# Patient Record
Sex: Female | Born: 1965 | Hispanic: Yes | Marital: Married | Smoking: Former smoker
Health system: Southern US, Community
[De-identification: ages and names within clinical notes are randomized; demographics above are authoritative.]

## PROBLEM LIST (undated history)

## (undated) ENCOUNTER — Ambulatory Visit: Admission: EM | Source: Home / Self Care

## (undated) DIAGNOSIS — E7800 Pure hypercholesterolemia, unspecified: Secondary | ICD-10-CM

## (undated) DIAGNOSIS — J45909 Unspecified asthma, uncomplicated: Secondary | ICD-10-CM

## (undated) DIAGNOSIS — C55 Malignant neoplasm of uterus, part unspecified: Secondary | ICD-10-CM

## (undated) DIAGNOSIS — N200 Calculus of kidney: Secondary | ICD-10-CM

## (undated) DIAGNOSIS — R002 Palpitations: Secondary | ICD-10-CM

## (undated) DIAGNOSIS — I442 Atrioventricular block, complete: Secondary | ICD-10-CM

## (undated) HISTORY — DX: Palpitations: R00.2

## (undated) HISTORY — PX: PATENT FORAMEN OVALE CLOSURE: SHX2181

## (undated) HISTORY — DX: Malignant neoplasm of uterus, part unspecified: C55

## (undated) HISTORY — PX: CHOLECYSTECTOMY: SHX55

## (undated) HISTORY — DX: Calculus of kidney: N20.0

## (undated) HISTORY — PX: APPENDECTOMY: SHX54

## (undated) HISTORY — PX: OTHER SURGICAL HISTORY: SHX169

## (undated) HISTORY — PX: WRIST SURGERY: SHX841

---

## 1996-10-28 DIAGNOSIS — C55 Malignant neoplasm of uterus, part unspecified: Secondary | ICD-10-CM

## 1996-10-28 HISTORY — DX: Malignant neoplasm of uterus, part unspecified: C55

## 2010-05-29 ENCOUNTER — Emergency Department: Payer: Self-pay | Admitting: Emergency Medicine

## 2010-08-20 ENCOUNTER — Emergency Department: Payer: Self-pay | Admitting: Emergency Medicine

## 2010-09-04 ENCOUNTER — Encounter: Payer: Self-pay | Admitting: Cardiovascular Disease

## 2010-09-04 ENCOUNTER — Emergency Department: Payer: Self-pay | Admitting: Emergency Medicine

## 2010-10-12 ENCOUNTER — Emergency Department: Payer: Self-pay | Admitting: Emergency Medicine

## 2010-11-02 ENCOUNTER — Ambulatory Visit
Admission: RE | Admit: 2010-11-02 | Discharge: 2010-11-02 | Payer: Self-pay | Source: Home / Self Care | Attending: Cardiovascular Disease | Admitting: Cardiovascular Disease

## 2010-11-02 ENCOUNTER — Encounter: Payer: Self-pay | Admitting: Cardiovascular Disease

## 2010-11-02 DIAGNOSIS — E782 Mixed hyperlipidemia: Secondary | ICD-10-CM | POA: Insufficient documentation

## 2010-11-02 DIAGNOSIS — E7849 Other hyperlipidemia: Secondary | ICD-10-CM | POA: Insufficient documentation

## 2010-11-02 DIAGNOSIS — R000 Tachycardia, unspecified: Secondary | ICD-10-CM | POA: Insufficient documentation

## 2010-11-02 DIAGNOSIS — E785 Hyperlipidemia, unspecified: Secondary | ICD-10-CM | POA: Insufficient documentation

## 2010-11-02 DIAGNOSIS — R0602 Shortness of breath: Secondary | ICD-10-CM | POA: Insufficient documentation

## 2010-11-20 ENCOUNTER — Ambulatory Visit: Admission: RE | Admit: 2010-11-20 | Discharge: 2010-11-20 | Payer: Self-pay | Source: Home / Self Care

## 2010-11-20 ENCOUNTER — Other Ambulatory Visit: Payer: Self-pay | Admitting: Cardiovascular Disease

## 2010-11-21 ENCOUNTER — Ambulatory Visit
Admission: RE | Admit: 2010-11-21 | Discharge: 2010-11-21 | Payer: Self-pay | Source: Home / Self Care | Attending: Internal Medicine | Admitting: Internal Medicine

## 2010-11-21 ENCOUNTER — Encounter: Payer: Self-pay | Admitting: Cardiovascular Disease

## 2010-11-26 LAB — CONVERTED CEMR LAB
ALT: 10 units/L (ref 0–35)
Alkaline Phosphatase: 63 units/L (ref 39–117)
HDL: 48 mg/dL (ref >39)
LDL Cholesterol: 199 mg/dL — ABNORMAL HIGH (ref 0–99)
Total Bilirubin: 0.6 mg/dL (ref 0.3–1.2)
Total CHOL/HDL Ratio: 5.6
Total Protein: 6.8 g/dL (ref 6.0–8.3)

## 2010-11-29 NOTE — Assessment & Plan Note (Signed)
Summary: NP6/AMD   Visit Type:  Initial Consult Primary Provider:  None  CC:  c/o palpitations and SOB. f/u [facility].  History of Present Illness: Lori [name] is a pleasant 45 year old woman who is predominantly Spanish-speaking who presents with her husband for evaluation of tachycardia and shortness of breath with chest pain.  Her husband is translating for her today. She reports that she has had 3 significant episodes of tachypalpitations associated with shortness of breath and chest discomfort. She reports that the episodes present rapidly. Typically they last for several minutes at a time, the most recent episode was 10 minutes which was at the end of November. She has not had an episode in one month. She describes the symptoms as a rapid pounding that eventually seems to ease off. The rate is very fast and causes some shortness of breath. Typically it comes on at rest or light exertion. She denies any new medications. She reports that one family member also has similar symptoms.  She reports being told in the past that she has a valve problem. She did present to the emergency room on December 16 for the symptoms though no physician note is available. Her lab work is normal including cardiac enzymes, basic metabolic panel, LFTs, BNP, CBC, sedimentation rate, d-dimer  EKG today shows normal sinus rhythm with rate 64 beats per minute, no significant ST or T wave changes  Preventive Screening-Counseling & Management  Alcohol-Tobacco     Smoking Status: never  Caffeine-Diet-Exercise     Does Patient Exercise: no      Drug Use:  no.    Current Medications (verified): 1)  Aspir-Low 81 Mg Tbec (Aspirin) .Marland Kitchen.. 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  Alcohol Use - yes-occasionally Drug Use - no Regular Exercise - no Smoking Status:  never Drug Use:  no Does Patient Exercise:  no  Review of Systems  The patient denies fever, weight loss, weight gain, vision loss, decreased hearing, hoarseness, syncope, dyspnea on exertion, peripheral edema, prolonged cough, abdominal pain, incontinence, muscle weakness, depression, and enlarged lymph nodes.         tachycardia with rapid palpitations, SOB, CP  Vital Signs:  Patient profile:   45 year old female Height:      64 inches Weight:      157.50 pounds BMI:     27.13 Pulse rate:   64 / minute BP sitting:   112 / 68  (left arm) Cuff size:   regular  Vitals Entered By: Lysbeth Galas CMA (November 02, 2010 2:03 PM)   Physical Exam  General:  Well developed, well nourished, in no acute distress. Head:  normocephalic and atraumatic Neck:  Neck supple, no JVD. No masses, thyromegaly or abnormal cervical nodes. Lungs:  Clear bilaterally to auscultation and percussion. Heart:  Non-displaced PMI, chest non-tender; regular rate and rhythm, S1, S2 without murmurs, rubs or gallops. Carotid upstroke normal, no bruit.  Pedals normal pulses. No edema, no varicosities. Abdomen:  Bowel sounds positive; abdomen soft and non-tender without masses Msk:  Back normal, normal gait. Muscle strength and tone normal. Pulses:  pulses normal in all 4 extremities Extremities:  No clubbing or cyanosis. Neurologic:  Alert  and oriented x 3. Skin:  Intact without lesions or rashes. Psych:  Normal affect.   Impression & Recommendations:  Problem # 1:  UNSPECIFIED TACHYCARDIA (ICD-785.0) symptoms are concerning for an SVT or atrial tachycardia. She has not tried any medications in the past. Her symptoms present very rarely and she's only had 3 episodes total, one of which was back [place] They do not last as long time and is uncertain if she would benefit from medication p.r.n. for rhythm control. As they are so rare, we will hold off on Holter or event monitor testing.  We have ordered an echocardiogram for symptoms of tachycardia, chest pain, shortness of breath and as she reports a history of valve problem in the past. We have given her a prescription for diltiazem 30 mg to be taken p.r.n. if her symptoms of tachycardia persist.  We have taught her the carotid sinus maneuver and Valsalva and have asked her to contact us if she starts having more episodes.  Orders: Echocardiogram (Echo)  Problem # 2:  HYPERLIPIDEMIA (ICD-272.4) We have suggested that she come to the office next week for a cholesterol check when she has been fasting.  Orders: T-Hepatic Function [phone]) T-Lipid Profile [phone])  Patient Instructions: 1)  Your physician recommends that you schedule a follow-up appointment in: 6 months 2)  Your physician recommends that you return for a FASTING lipid profile: To be done at [facility] (lipid/lft) 3)  Your physician has recommended you make the following change in your medication: START Diltiazem 30mg  1-2 tablets by mouth as needed for fast heartbeat. 4)  Your physician has requested that you have an echocardiogram.  Echocardiography is a painless test that uses sound waves to create images of your heart. It provides your doctor with information about the size and shape of your heart and how well your heart's chambers and valves are working.  This procedure takes approximately one hour. There are no restrictions for this procedure. Prescriptions: DILTIAZEM HCL 30 MG TABS (DILTIAZEM HCL) Take 1-2 tablets as needed for tachycardia  #60 x 6   Entered by:   Lanny Hurst RN   Authorized by:   Dossie Arbour MD   Signed by:   Lanny Hurst RN on 11/02/2010   Method used:   Faxed to ...       [facility] (retail)       [address]        [place], [place]    [place]       Ph: [phone]       Fax: [phone]   RxID:   [id number] DILTIAZEM HCL 30 MG TABS (DILTIAZEM HCL) Take 1 tablet by mouth once a day  #30 x 6   Entered by:   Lanny Hurst RN   Authorized by:   Dossie Arbour MD   Signed by:   Lanny Hurst RN on 11/02/2010   Method used:   Faxed to ...       [facility] (retail)       [address]       [place], [place]    [place]       Ph: [phone]       Fax: [phone]   RxID:   [id number]

## 2011-01-12 ENCOUNTER — Ambulatory Visit: Payer: Federal, State, Local not specified - PPO | Admitting: Family Medicine

## 2011-01-12 ENCOUNTER — Encounter: Payer: Self-pay | Admitting: Family Medicine

## 2011-01-12 DIAGNOSIS — F40298 Other specified phobia: Secondary | ICD-10-CM

## 2011-01-12 DIAGNOSIS — E785 Hyperlipidemia, unspecified: Secondary | ICD-10-CM

## 2011-01-12 DIAGNOSIS — I499 Cardiac arrhythmia, unspecified: Secondary | ICD-10-CM

## 2011-01-12 DIAGNOSIS — F449 Dissociative and conversion disorder, unspecified: Secondary | ICD-10-CM

## 2011-01-15 NOTE — Assessment & Plan Note (Signed)
Summary: anxiety/jbb   Vital Signs:  Patient Profile:   45 Years Old Female CC:      anxiety Height:     66 inches Weight:      158 pounds Temp:     98 degrees F oral Pulse rate:   76 / minute Pulse rhythm:   regular Resp:     18 per minute BP sitting:   114 / 70  (left arm) Cuff size:   regular  Vitals Entered By: Providence Crosby LPN (February 10, 2011 2:13 PM)                  Current Allergies (reviewed today): No known allergies History of Present Illness History from: patient and husband Chief Complaint: anxiety History of Present Illness: SPANISH TRANSLATOR USED TO SPEAK WITH PATIENT (HUSBAND) WHO VERBALIZED UNDERSTANDING.  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  Pt has not been able to sleep.  She is hysterical!  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  She also has a history of palpatations.   She is having an exacerbation of her anxiety and irrational fears of finding spiders in her home.  She is caring for a 57 month old (breastfeeding).     REVIEW OF SYSTEMS Constitutional Symptoms      Denies fever, chills, night sweats, weight loss, weight gain, and fatigue.  Eyes       Denies change in vision, eye pain, eye discharge, glasses, contact lenses, and eye surgery. Ear/Nose/Throat/Mouth       Denies hearing loss/aids, change in hearing, ear pain, ear discharge, dizziness, frequent runny nose, frequent nose bleeds, sinus problems, sore throat, hoarseness, and tooth pain or bleeding.  Respiratory       Complains of shortness of breath.      Denies dry cough, productive cough, wheezing, asthma, bronchitis, and emphysema/COPD.  Cardiovascular       Complains of chest pain.      Denies murmurs and tires easily with exhertion.    Gastrointestinal       Denies stomach pain, nausea/vomiting, diarrhea, constipation, blood in bowel movements,  and indigestion. Genitourniary       Denies painful urination, kidney stones, and loss of urinary control. Neurological       Denies paralysis, seizures, and fainting/blackouts. Musculoskeletal       Denies muscle pain, joint pain, joint stiffness, decreased range of motion, redness, swelling, muscle weakness, and gout.  Skin       Denies bruising, unusual mles/lumps or sores, and hair/skin or nail changes.      Comments: Itching all over body Psych       Complains of mood changes and anxiety/stress.      Denies temper/anger issues, speech problems, depression, and sleep problems. Other Comments: Phobia to spiders   Past History:  Family History: Last updated: 10-Feb-2011 Father: Deceased Heart attack Mother: alive history of hypertension Siblings: brother : unknown Sister : not known  Social History: Last updated: 2011-02-10 Marital Status: Married Children:4 children  Occupation:  Futures trader  Past Medical History: Hyperlipidemia Palpatations Arachnophobia Low Blood Pressure Ventricular Arrythmia  Past Surgical History: Appendectomy Caesarean section Cholecystectomy Hand surgery  Family History: Father: Deceased Heart attack Mother: alive history of hypertension Siblings: brother : unknown Sister : not known  Social History: Marital Status: Married Children:4 children  Occupation:  Futures trader Physical Exam General appearance: well developed,  well nourished, no acute distress Head: normocephalic, atraumatic Eyes: conjunctivae and lids normal Pupils: equal, round, reactive to light Ears: normal, no lesions or deformities Nasal: mucosa pink, nonedematous, no septal deviation, turbinates normal Oral/Pharynx: tongue normal, posterior pharynx without erythema or exudate Neck: neck supple,  trachea midline, no masses Chest/Lungs: no rales, wheezes, or rhonchi bilateral, breath sounds equal without effort Heart: regular rate and  rhythm, no murmur Abdomen: soft,  non-tender without obvious organomegaly Extremities: normal extremities Neurological: grossly intact and non-focal Skin: no obvious rashes or lesions MSE: anxious and crying hysterically over spider Assessment Problems:   New Problems: VENTRICULAR ARRHYTHMIA (ICD-427.9) HYSTERIA, UNSPECIFIED (ICD-300.10) 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 The risks, benefits and possible side effects were clearly explained and discussed with the patient.  The patient verbalized clear understanding.  The patient was given instructions to return if symptoms don't improve, worsen or new changes develop.  If it is not during clinic hours and the patient cannot get back to this clinic then the patient was told to seek medical care at an available urgent care or emergency department.  The patient verbalized understanding.   Demonstrates willingness to comply.  Plan New Medications/Changes: LORAZEPAM 0.5 MG TABS (LORAZEPAM) take 1 by mouth every 4-6 hours as needed severe anxiety and hysteria.  Caution will cause drowsiness: Do not breastfeed on this medication.  #20 x 0, 01/12/2011, Standley Dakins MD  Planning Comments:   Husband was Engineer, structural Used to Communicate with patient.  The patient verbalized clear understanding.   Pt was given instructions to discontinue breastfeeding while taking the medication and to pump and dump to keep milk flowing.     Follow Up: Follow up in 1-2 days if no improvement, Follow up on an as needed basis, Follow up with Primary Physician  The patient and/or caregiver has been counseled thoroughly with regard to medications prescribed including dosage, schedule, interactions, rationale for use, and possible side effects and they verbalize understanding.  Diagnoses and expected course of recovery discussed and will return if not improved as expected or if the condition worsens. Patient and/or  caregiver verbalized understanding.  Prescriptions: LORAZEPAM 0.5 MG TABS (LORAZEPAM) take 1 by mouth every 4-6 hours as needed severe anxiety and hysteria.  Caution will cause drowsiness: Do not breastfeed on this medication.  #20 x 0   Entered and Authorized by:   Standley Dakins MD   Signed by:   Standley Dakins MD on 01/12/2011   Method used:   Handwritten   RxID:   [id number]   Patient Instructions: 1)  Go to the pharmacy to pick up the medication.  2)  Do not Breastfeed while taking this medication (lorazepam).  You can Pump and Dump to keep your milk supply flowing but don't give to the infant.  3)  Return or go to the ER if no improvement or symptoms getting worse.   4)  The patient was informed that there is no on-call provider or services available at this clinic during off-hours (when the clinic is closed).  If the patient developed a problem or concern that required immediate attention, the patient was advised to go the the nearest available urgent care or emergency department for medical care.  The patient verbalized understanding.

## 2011-01-24 NOTE — Letter (Signed)
Summary: history form   history form   Imported By: Eugenio Hoes 01/16/2011 10:18:22  _____________________________________________________________________  External Attachment:    Type:   Image     Comment:   External Document

## 2011-02-12 ENCOUNTER — Telehealth: Payer: Self-pay | Admitting: Cardiovascular Disease

## 2011-02-12 ENCOUNTER — Encounter (INDEPENDENT_AMBULATORY_CARE_PROVIDER_SITE_OTHER): Payer: Federal, State, Local not specified - PPO | Admitting: *Deleted

## 2011-02-12 ENCOUNTER — Encounter: Payer: Self-pay | Admitting: *Deleted

## 2011-02-12 ENCOUNTER — Encounter: Payer: Self-pay | Admitting: Cardiovascular Disease

## 2011-02-12 ENCOUNTER — Ambulatory Visit (INDEPENDENT_AMBULATORY_CARE_PROVIDER_SITE_OTHER): Payer: Federal, State, Local not specified - PPO | Admitting: Cardiovascular Disease

## 2011-02-12 DIAGNOSIS — R000 Tachycardia, unspecified: Secondary | ICD-10-CM

## 2011-02-12 DIAGNOSIS — R002 Palpitations: Secondary | ICD-10-CM

## 2011-02-12 DIAGNOSIS — R0602 Shortness of breath: Secondary | ICD-10-CM

## 2011-02-12 DIAGNOSIS — E785 Hyperlipidemia, unspecified: Secondary | ICD-10-CM

## 2011-02-12 DIAGNOSIS — M79609 Pain in unspecified limb: Secondary | ICD-10-CM

## 2011-02-12 DIAGNOSIS — M79606 Pain in leg, unspecified: Secondary | ICD-10-CM | POA: Insufficient documentation

## 2011-02-12 NOTE — Assessment & Plan Note (Signed)
Her shortness of breath in the setting of her tachycardia is likely a rate related pain. We have encouraged her to continue her diltiazem p.r.n., taking up to 2 tablets at a time also with Valsalva and carotid sinus massage.

## 2011-02-12 NOTE — Telephone Encounter (Signed)
Pt in for office visit to see Dr. Mariah Milling.

## 2011-02-12 NOTE — Assessment & Plan Note (Signed)
I am concerned about atrial tachycardia or SVT. I've ordered a 48 hour Holter monitor. If this is unrevealing, we could order an event monitor for one month.

## 2011-02-12 NOTE — Assessment & Plan Note (Signed)
Etiology of her leg pain is uncertain. She reports there is some swelling and tenderness still after her long car trip. We have ordered a right  lower extremity ultrasound to exclude DVT

## 2011-02-12 NOTE — Progress Notes (Signed)
   Patient ID: Lori [name], female    DOB: October 06, 1966, 45 y.o.   MRN: [id number]  HPI Comments: Ms. Segers is a pleasant 45 year old woman who is predominantly Spanish-speaking who presents for follow up evaluation of tachycardia and shortness of breath with chest pain.   She reports that she continues to have episodes of tachypalpitations associated with shortness of breath and chest pain. She had one last night, several episodes over the past week. He also had several episodes over the past month. She was on a recent trip to [place] and also developed left leg pain in the calf region and she is worried about a blood clot. Her leg is still sore today several days after her recent long car trip. She woke up at 2:00 in the morning last night with tachypalpitations, tried diltiazem, tried Valsalva maneuvers and it did not break. It eventually eased off on its own.   She has had trips to the emergency room such as last December for tachypalpitations.   EKG today shows normal sinus rhythm with rate 66  beats per minute, Sinus arrhythmia, no significant ST or T wave changes      Review of Systems  Constitutional: Negative.   HENT: Negative.   Eyes: Negative.   Respiratory: Positive for shortness of breath.   Cardiovascular: Positive for chest pain and palpitations.       Tachycardia  Gastrointestinal: Negative.   Musculoskeletal: Negative.   Skin: Negative.   Neurological: Negative.   Hematological: Negative.   Psychiatric/Behavioral: Negative.   All other systems reviewed and are negative.   BP 120/80  Pulse 62  Ht 5\' 4"  (1.626 m)  Wt 157 lb (71.215 kg)  BMI 26.95 kg/m2   Physical Exam  Nursing note and vitals reviewed. Constitutional: She is oriented to person, place, and time. She appears well-developed and well-nourished.  HENT:  Head: Normocephalic.  Nose: Nose normal.  Mouth/Throat: Oropharynx is clear and moist.  Eyes: Conjunctivae are normal. Pupils are equal, round,  and reactive to light.  Neck: Normal range of motion. Neck supple. No JVD present.  Cardiovascular: Normal rate, regular rhythm, normal heart sounds and intact distal pulses.  Exam reveals no gallop and no friction rub.   No murmur heard. Pulmonary/Chest: Effort normal and breath sounds normal. No respiratory distress. She has no wheezes. She has no rales. She exhibits no tenderness.  Abdominal: Soft. Bowel sounds are normal. She exhibits no distension. There is no tenderness.  Musculoskeletal: Normal range of motion. She exhibits no edema and no tenderness.  Lymphadenopathy:    She has no cervical adenopathy.  Neurological: She is alert and oriented to person, place, and time. Coordination normal.  Skin: Skin is warm and dry. No rash noted. No erythema.  Psychiatric: She has a normal mood and affect. Her behavior is normal. Judgment and thought content normal.         Assessment and Plan

## 2011-02-12 NOTE — Telephone Encounter (Signed)
Pt feels like she is having arrythmia.  Took medication last night and symptoms still persist.

## 2011-02-12 NOTE — Patient Instructions (Addendum)
We have ordered a holter monitor for you to capture your fast rhythm. Please wear this for 48 hours and then return to the office for rhythm evaluation. We have also ordered a leg ultrasound to make sure there is no clot. Your appt for this procedure is today at 10:00am. We will call you for follow up appt in about 1 month

## 2011-02-13 ENCOUNTER — Encounter: Payer: Self-pay | Admitting: Cardiovascular Disease

## 2011-02-13 NOTE — Progress Notes (Signed)
Addended by: Lanny Hurst on: 02/13/2011 01:43 PM   Modules accepted: Orders

## 2011-02-19 ENCOUNTER — Telehealth: Payer: Self-pay | Admitting: *Deleted

## 2011-02-19 NOTE — Telephone Encounter (Signed)
Spoke to pt's husband, notified him of results of pt's holter monitor to be NSR with rare short runs of SVT, and frequent APC's. No long runs of SVT that would cause the symptoms she has described. Notified pt's husband that if she does have longer runs, we were just unable to capture on holter. Gave them the option to have pt wear 30 day event monitor to see if we can capture abnormal rhythm. Pt's husband states she does want to do this, notified him I will order today and they should receive within 2-3 days. Pt will call with any episodes in the meantime.

## 2011-02-20 ENCOUNTER — Other Ambulatory Visit: Payer: Self-pay | Admitting: *Deleted

## 2011-03-14 ENCOUNTER — Ambulatory Visit: Payer: Federal, State, Local not specified - PPO | Admitting: Cardiovascular Disease

## 2011-04-02 ENCOUNTER — Ambulatory Visit: Payer: Federal, State, Local not specified - PPO | Admitting: Cardiovascular Disease

## 2011-05-29 ENCOUNTER — Ambulatory Visit: Payer: Federal, State, Local not specified - PPO | Admitting: Cardiovascular Disease

## 2011-06-06 ENCOUNTER — Other Ambulatory Visit: Payer: Self-pay | Admitting: Cardiovascular Disease

## 2011-06-06 DIAGNOSIS — R55 Syncope and collapse: Secondary | ICD-10-CM

## 2011-06-11 ENCOUNTER — Ambulatory Visit (INDEPENDENT_AMBULATORY_CARE_PROVIDER_SITE_OTHER): Payer: Federal, State, Local not specified - PPO | Admitting: Cardiovascular Disease

## 2011-06-11 ENCOUNTER — Encounter: Payer: Self-pay | Admitting: Cardiovascular Disease

## 2011-06-11 DIAGNOSIS — R000 Tachycardia, unspecified: Secondary | ICD-10-CM

## 2011-06-11 DIAGNOSIS — F418 Other specified anxiety disorders: Secondary | ICD-10-CM | POA: Insufficient documentation

## 2011-06-11 DIAGNOSIS — R0602 Shortness of breath: Secondary | ICD-10-CM

## 2011-06-11 DIAGNOSIS — F419 Anxiety disorder, unspecified: Secondary | ICD-10-CM

## 2011-06-11 DIAGNOSIS — E785 Hyperlipidemia, unspecified: Secondary | ICD-10-CM

## 2011-06-11 MED ORDER — METOPROLOL TARTRATE 25 MG PO TABS: 25.0000 mg | ORAL_TABLET | Freq: Two times a day (BID) | ORAL | Status: DC

## 2011-06-11 NOTE — Assessment & Plan Note (Signed)
Etiology of her tachypalpitations is still uncertain. She did wear any event monitor for several weeks in the past though this did not show any arrhythmia. She certainly could have SVT that has not been clearly defined on EKG. We will start metoprolol tartrate p.r.n. In addition to her Cardizem that she can take as needed for tachycardia palpitation episodes. In the past several months, she has had only rare symptoms. Some of her presentation today seems to be predominantly involving her recent stress. This could also be contributing to her symptoms.
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
## 2011-06-11 NOTE — Assessment & Plan Note (Signed)
Short periods of shortness of breath seems to occur during her periods of tachycardia palpitations otherwise no shortness of breath at baseline.

## 2011-06-11 NOTE — Patient Instructions (Signed)
You are doing well. Please start metoprolol one tab twice a day as needed for tachycardia. Ok to take cardizem as well.  Please call us if you have new issues that need to be addressed before your next appt.  We will call you for a follow up Appt. In 6 months

## 2011-06-11 NOTE — Assessment & Plan Note (Signed)
>>  ASSESSMENT AND PLAN FOR DYSLIPIDEMIA WRITTEN ON 06/11/2011 10:35 AM BY Antonieta Iba, MD  Currently on a statin. We'll continue this medication for now.

## 2011-06-11 NOTE — Assessment & Plan Note (Signed)
Currently on a statin. We'll continue this medication for now.

## 2011-06-11 NOTE — Progress Notes (Signed)
Patient ID: Lori [name], female    DOB: 11-30-1965, 45 y.o.   MRN: [id number]  HPI Comments: Lori [name] is a pleasant 45 year old woman who is predominantly Spanish-speaking who presents for follow up evaluation of tachycardia and shortness of breath with chest pain.  In the past, she did complete 2 weeks of a event monitor that did not show any significant arrhythmia. We certainly could have missed any events that she was unable to continue to monitor secondary to skin irritation from the EKG leads.  She reports that she started, Zoll or 6 days last week as well as Zoloft. She took one dose of Zoloft and reported that it did not occur a few well with tachycardia, chest pain. At the same time, she had a call from [place] about a family member that was in an accident. She wanted to fly to [place] and has had significant stress all week. Lori [name] reports that she's had significant stress and has been tearful. She states that through the week she has had malaise, fatigue and weakness. She has some dull pain on the left side of Lori chest. Last night, she had tachycardia and took a Cardizem. Symptoms lasted for several minutes. She's continued to have some left side chest discomfort. Today, she reports that she is very stressed. She did have a recent several month period in [place] and felt well with no symptoms of tachycardia palpitations.    She did go to the emergency room yesterday though the weight time was too long and she went home. She was told to follow up with cardiology today.    EKG today shows normal sinus rhythm with rate 70  beats per minute,  no significant ST or T wave changes     Outpatient Encounter Prescriptions as of 06/11/2011  Medication Sig Dispense Refill  . aspirin 81 MG EC tablet Take 81 mg by mouth daily.        Marland Kitchen atorvastatin (LIPITOR) 20 MG tablet Take 20 mg by mouth daily.        Marland Kitchen diltiazem (CARDIZEM) 30 MG tablet Take 30 mg by mouth daily. 1-2 tablets prn for  tachycardia       . sertraline (ZOLOFT) 25 MG tablet Take 25 mg by mouth daily.          Review of Systems  Constitutional: Negative.   HENT: Negative.   Eyes: Negative.   Respiratory: Negative.   Cardiovascular: Positive for chest pain and palpitations.  Gastrointestinal: Negative.   Musculoskeletal: Negative.   Skin: Negative.   Neurological: Negative.   Hematological: Negative.   Psychiatric/Behavioral: The patient is nervous/anxious.   All other systems reviewed and are negative.    BP 144/83  Pulse 70  Ht 5\' 6"  (1.676 m)  Physical Exam  Nursing note and vitals reviewed. Constitutional: She is oriented to person, place, and time. She appears well-developed and well-nourished.  HENT:  Head: Normocephalic.  Nose: Nose normal.  Mouth/Throat: Oropharynx is clear and moist.  Eyes: Conjunctivae are normal. Pupils are equal, round, and reactive to light.  Neck: Normal range of motion. Neck supple. No JVD present.  Cardiovascular: Normal rate, regular rhythm, S1 normal, S2 normal, normal heart sounds and intact distal pulses.  Exam reveals no gallop and no friction rub.   No murmur heard. Pulmonary/Chest: Effort normal and breath sounds normal. No respiratory distress. She has no wheezes. She has no rales. She exhibits no tenderness.  Abdominal: Soft. Bowel sounds are normal. She exhibits  no distension. There is no tenderness.  Musculoskeletal: Normal range of motion. She exhibits no edema and no tenderness.  Lymphadenopathy:    She has no cervical adenopathy.  Neurological: She is alert and oriented to person, place, and time. Coordination normal.  Skin: Skin is warm and dry. No rash noted. No erythema.  Psychiatric: She has a normal mood and affect. Lori behavior is normal. Judgment and thought content normal.         Assessment and Plan

## 2011-06-19 ENCOUNTER — Other Ambulatory Visit: Payer: Self-pay | Admitting: Cardiovascular Disease

## 2011-06-20 ENCOUNTER — Telehealth: Payer: Self-pay

## 2011-06-20 MED ORDER — ATORVASTATIN CALCIUM 20 MG PO TABS: 20.0000 mg | ORAL_TABLET | Freq: Every day | ORAL | Status: DC

## 2011-06-20 NOTE — Telephone Encounter (Signed)
Refill requested for atorvastatin.

## 2011-06-22 ENCOUNTER — Emergency Department: Payer: Self-pay | Admitting: Internal Medicine

## 2011-06-25 ENCOUNTER — Ambulatory Visit (INDEPENDENT_AMBULATORY_CARE_PROVIDER_SITE_OTHER): Payer: Federal, State, Local not specified - PPO | Admitting: Cardiovascular Disease

## 2011-06-25 ENCOUNTER — Encounter: Payer: Self-pay | Admitting: Cardiovascular Disease

## 2011-06-25 VITALS — BP 130/79 | HR 71 | Ht 64.0 in | Wt 163.0 lb

## 2011-06-25 DIAGNOSIS — I499 Cardiac arrhythmia, unspecified: Secondary | ICD-10-CM

## 2011-06-25 MED ORDER — PROPRANOLOL HCL 10 MG PO TABS: 10.0000 mg | ORAL_TABLET | Freq: Four times a day (QID) | ORAL | Status: DC | PRN

## 2011-06-25 NOTE — Assessment & Plan Note (Addendum)
Will try propranolol 10 mg QID PRN instead of the diltiazem.  To see Dr. Mariah Milling in several weeks She was not able to keep the monitor on for long due to allergic reaction to the electrodes.  No syncope The cp is clearly muscular - very tender on her mid sternum.  I suspect a lot of her symptoms are due to anxiety. She started crying briefly during the visit today. I do not think that she has any serious arrhythmias. Unfortunately we were not able to catch any arrhythmias on the [facility] monitor because she did not wear for a long enough time.  I reassured her that her symptoms were not serious. I've given her some propranolol which she may take on an as-needed basis. She seems to like the idea that she has something to take on an as-needed basis. She was scared about taking the diltiazem because she was afraid that it would lower her blood pressure too much. I do not think that the propranolol we'll lower  blood pressure to a significant degree.

## 2011-06-25 NOTE — Progress Notes (Signed)
Lori [name] Date of Birth  04/28/1966 [facility] [address]     [address] [place], [place]  [address] [phone]  Fax  [phone]  History of Present Illness:  Mr. Lori [name] is a 45 yo spanish speaking female with a history of tachycardia palpitations. She has seen Dr.Gollan.  Event monitor was unremarkable. She had recurrent episodes of tachycardia palpitations.  These palpitations are also associated with some chest pains. She feels some "compression" in her heart.  These are associated with some shortness of breath, diaphoresis, and some disorientation.  Lori [name] does not speak much [place]. She was seen today with her husband who acted as an Equities trader.  Current Outpatient Prescriptions on File Prior to Visit  Medication Sig Dispense Refill  . aspirin 81 MG EC tablet Take 81 mg by mouth daily.        Marland Kitchen atorvastatin (LIPITOR) 20 MG tablet Take 1 tablet (20 mg total) by mouth daily.  30 tablet  6  . diltiazem (CARDIZEM) 30 MG tablet Take 30 mg by mouth daily. 1-2 tablets prn for tachycardia       . metoprolol tartrate (LOPRESSOR) 25 MG tablet Take 1 tablet (25 mg total) by mouth 2 (two) times daily. Take as needed  60 tablet  11  . sertraline (ZOLOFT) 25 MG tablet Take 25 mg by mouth daily.          No Known Allergies  Past Medical History  Diagnosis Date  . Cancer, uterine 1998    Past Surgical History  Procedure Date  . Wrist surgery     broken wrist,left  . Cholecystectomy   . Cesarean section     x2  . Appendectomy   . Surgery on face     AFTER FALL    History  Smoking status  . Former Smoker -- 1.0 packs/day for 5 years  . Types: Cigarettes  . Quit date: 02/11/2006  Smokeless tobacco  . Never Used    History  Alcohol Use  . Yes    OCCASIONAL    Family History  Problem Relation Age of Onset  . Hypertension Mother   . Heart disease Father     Reviw of Systems:  Reviewed in the HPI.  All other systems are  negative.  Physical Exam: BP 130/79  Pulse 71  Ht 5\' 4"  (1.626 m)  Wt 163 lb (73.936 kg)  BMI 27.98 kg/m2 The patient is alert and oriented x 3.  The mood and affect are normal.   Skin: warm and dry.  Color is normal.    HEENT:   the sclera are nonicteric.  The mucous membranes are moist.  The carotids are 2+ without bruits.  There is no thyromegaly.  There is no JVD.    Lungs: clear.  The chest wall is very tender along the sternum.   Heart: regular rate with a normal S1 and S2.  There are no murmurs, gallops, or rubs. The PMI is not displaced.     Abdomen: good bowel sounds.  There is no guarding or rebound.  There is no hepatosplenomegaly or tenderness.  There are no masses.   Extremities:  no clubbing, cyanosis, or edema.  The legs are without rashes.  The distal pulses are intact.   Neuro:  Cranial nerves II - XII are intact.  Motor and sensory functions are intact.    The gait is normal.  ECG: From EMS reveals NSR with sinus arrhythmia.  Assessment /  Plan:

## 2011-06-25 NOTE — Patient Instructions (Signed)
Start Propranolol 10mg  FOUR times daily as needed.  Your physician recommends that you schedule a follow-up appointment in: 2-3 weeks with Dr. Mariah Milling

## 2011-07-08 ENCOUNTER — Encounter: Payer: Self-pay | Admitting: Cardiovascular Disease

## 2011-07-11 ENCOUNTER — Ambulatory Visit: Payer: Self-pay | Admitting: Cardiovascular Disease

## 2011-07-26 ENCOUNTER — Encounter: Payer: Self-pay | Admitting: Cardiovascular Disease

## 2011-08-23 ENCOUNTER — Encounter: Payer: Self-pay | Admitting: Cardiovascular Disease

## 2011-08-23 ENCOUNTER — Ambulatory Visit (INDEPENDENT_AMBULATORY_CARE_PROVIDER_SITE_OTHER): Payer: Federal, State, Local not specified - PPO | Admitting: Cardiovascular Disease

## 2011-08-23 VITALS — BP 110/80 | HR 68 | Ht 67.0 in | Wt 163.0 lb

## 2011-08-23 DIAGNOSIS — R000 Tachycardia, unspecified: Secondary | ICD-10-CM

## 2011-08-23 DIAGNOSIS — E785 Hyperlipidemia, unspecified: Secondary | ICD-10-CM

## 2011-08-23 DIAGNOSIS — F419 Anxiety disorder, unspecified: Secondary | ICD-10-CM

## 2011-08-23 MED ORDER — METOPROLOL TARTRATE 25 MG PO TABS: 25.0000 mg | ORAL_TABLET | Freq: Two times a day (BID) | ORAL | Status: DC | PRN

## 2011-08-23 NOTE — Patient Instructions (Signed)
You are doing well. Start metoprolol at evening for tachycardia It is ok to also take diltiazem  Please call us if you have new issues that need to be addressed before your next appt.  The office will contact you for a follow up Appt. In 6 months

## 2011-08-23 NOTE — Progress Notes (Signed)
Patient ID: Lori [name], female    DOB: 09-15-1966, 45 y.o.   MRN: [id number]  HPI Comments: Lori [name] is a pleasant 45 year old woman who is predominantly Spanish-speaking who presents for follow up evaluation of tachycardia and shortness of breath with chest pain.  In the past, she did complete 2 weeks of a event monitor that did not show any significant arrhythmia. We certainly could have missed any events that she was unable to continue to monitor secondary to skin irritation from the EKG leads.  She was seen recently by Dr. Melburn Popper and a suggestion was made to start propranolol p.r.n. For tachycardia. She did not start the propranolol though it is uncertain why. She states that the diltiazem does seem to work for her though she would prefer to take a pill every day. Her tachycardia does not come on during the daytime, only at nighttime. She has had trips to the emergency room and has an EKG from one month ago showing sinus rhythm with sinus arrhythmia. She also takes a vitamin for "menopause" and wonders if it may be contributing to her symptoms. Sometimes when she does not take this, she has more symptoms. She talked to her gynecologist in [place] and he told her that "most women have palpitations".  Again, she does report that she is more anxious at night time. She is "afraid of the dark". That is when most of her palpitations seem to happen.    EKG today shows normal sinus rhythm with rate 71  beats per minute,  no significant ST or T wave changes     Outpatient Encounter Prescriptions as of 06/11/2011  Medication Sig Dispense Refill  . aspirin 81 MG EC tablet Take 81 mg by mouth daily.        Marland Kitchen atorvastatin (LIPITOR) 20 MG tablet Take 20 mg by mouth daily.        Marland Kitchen diltiazem (CARDIZEM) 30 MG tablet Take 30 mg by mouth daily. 1-2 tablets prn for tachycardia       . sertraline (ZOLOFT) 25 MG tablet Take 25 mg by mouth daily.          Review of Systems  Constitutional: Negative.     HENT: Negative.   Eyes: Negative.   Respiratory: Negative.   Cardiovascular: Positive for palpitations.  Gastrointestinal: Negative.   Musculoskeletal: Negative.   Skin: Negative.   Neurological: Negative.   Hematological: Negative.   Psychiatric/Behavioral: The patient is nervous/anxious.   All other systems reviewed and are negative.    BP 110/80  Pulse 68  Wt 163 lb (73.936 kg)  Physical Exam  Nursing note and vitals reviewed. Constitutional: She is oriented to person, place, and time. She appears well-developed and well-nourished.  HENT:  Head: Normocephalic.  Nose: Nose normal.  Mouth/Throat: Oropharynx is clear and moist.  Eyes: Conjunctivae are normal. Pupils are equal, round, and reactive to light.  Neck: Normal range of motion. Neck supple. No JVD present.  Cardiovascular: Normal rate, regular rhythm, S1 normal, S2 normal, normal heart sounds and intact distal pulses.  Exam reveals no gallop and no friction rub.   No murmur heard. Pulmonary/Chest: Effort normal and breath sounds normal. No respiratory distress. She has no wheezes. She has no rales. She exhibits no tenderness.  Abdominal: Soft. Bowel sounds are normal. She exhibits no distension. There is no tenderness.  Musculoskeletal: Normal range of motion. She exhibits no edema and no tenderness.  Lymphadenopathy:    She has no cervical adenopathy.  Neurological:  She is alert and oriented to person, place, and time. Coordination normal.  Skin: Skin is warm and dry. No rash noted. No erythema.  Psychiatric: She has a normal mood and affect. Her behavior is normal. Judgment and thought content normal.         Assessment and Plan

## 2011-08-23 NOTE — Assessment & Plan Note (Signed)
>>  ASSESSMENT AND PLAN FOR DYSLIPIDEMIA WRITTEN ON 08/23/2011  6:26 PM BY Antonieta Iba, MD  We have encouraged her to stay on the Lipitor.

## 2011-08-23 NOTE — Assessment & Plan Note (Signed)
We have encouraged her to stay on the Lipitor.

## 2011-08-23 NOTE — Assessment & Plan Note (Signed)
She would like a medication to take on a regular basis. We have suggested she try metoprolol tartrate 25 mg in the evening only with diltiazem 30 mg for breakthrough palpitations. Alternatively, she could take diltiazem 30 mg every night as it does seem to work for her. Her blood pressure is too low to start long-acting diltiazem.

## 2011-08-23 NOTE — Assessment & Plan Note (Signed)
She does seem to have some anxiety. Also has stress at home. We will defer to her primary care physician whether she is a candidate for p.r.n. Ativan.

## 2011-11-04 ENCOUNTER — Ambulatory Visit (INDEPENDENT_AMBULATORY_CARE_PROVIDER_SITE_OTHER): Payer: Federal, State, Local not specified - PPO | Admitting: Cardiovascular Disease

## 2011-11-04 ENCOUNTER — Encounter: Payer: Self-pay | Admitting: Cardiovascular Disease

## 2011-11-04 DIAGNOSIS — Z79899 Other long term (current) drug therapy: Secondary | ICD-10-CM

## 2011-11-04 DIAGNOSIS — R000 Tachycardia, unspecified: Secondary | ICD-10-CM

## 2011-11-04 DIAGNOSIS — E785 Hyperlipidemia, unspecified: Secondary | ICD-10-CM

## 2011-11-04 DIAGNOSIS — R079 Chest pain, unspecified: Secondary | ICD-10-CM | POA: Insufficient documentation

## 2011-11-04 DIAGNOSIS — I499 Cardiac arrhythmia, unspecified: Secondary | ICD-10-CM

## 2011-11-04 DIAGNOSIS — R0602 Shortness of breath: Secondary | ICD-10-CM

## 2011-11-04 NOTE — Assessment & Plan Note (Signed)
She no longer takes Lipitor Secondary to muscle ache and pain in her hands.  We will check her cholesterol and restart cholesterol medication, possibly Crestor 5 mg daily.

## 2011-11-04 NOTE — Assessment & Plan Note (Signed)
Chest pain is atypical in nature, reproducible with muscle palpation. EKG is normal. No further workup at this time.

## 2011-11-04 NOTE — Progress Notes (Signed)
Patient ID: Lori [name], female    DOB: 11-Oct-1966, 46 y.o.   MRN: [id number]  HPI Comments: Lori [name] is a pleasant 46 year old woman who is predominantly Spanish-speaking who presents for follow up evaluation of tachycardia and shortness of breath with chest pain.  In the past, she did complete 2 weeks of a event monitor that did not show any significant arrhythmia. We certainly could have missed any events that she was unable to continue to monitor secondary to skin irritation from the EKG leads.  She was seen recently by Dr. Melburn Popper and a suggestion was made to start propranolol p.r.n. For tachycardia. She did not start the propranolol though it is uncertain why.  We had suggested she take metoprolol tartrate 25 mg p.m. With diltiazem 30 mg p.r.n. For breakthrough palpitations. On this regimen, she reports that she has been doing well. Again, she does report that she is more anxious at night time. She is "afraid of the dark". That is when most of her palpitations seem to happen.  She does report an episode of chest squeezing and pain recently after taking albuterol. It sounds like she had a upper respiratory infection, took albuterol and subsequent to that had left-sided pain in her pectoral region. Into the axilla    EKG today shows normal sinus rhythm with rate 69  beats per minute,  no significant ST or T wave changes     Outpatient Encounter Prescriptions as of 11/04/2011  Medication Sig Dispense Refill  . albuterol (PROVENTIL HFA;VENTOLIN HFA) 108 (90 BASE) MCG/ACT inhaler Inhale 2 puffs into the lungs every 6 (six) hours as needed.        . diltiazem (CARDIZEM) 30 MG tablet Take 30 mg by mouth daily. 1-2 tablets prn for tachycardia       . metoprolol tartrate (LOPRESSOR) 25 MG tablet Take 1 tablet (25 mg total) by mouth  as needed. Take in the PM  60 tablet  11     Review of Systems  Constitutional: Negative.   HENT: Negative.   Eyes: Negative.   Respiratory: Negative.     Cardiovascular: Positive for chest pain.  Gastrointestinal: Negative.   Musculoskeletal: Negative.   Skin: Negative.   Neurological: Negative.   Hematological: Negative.   Psychiatric/Behavioral: The patient is nervous/anxious.   All other systems reviewed and are negative.    BP 108/62  Pulse 69  Ht 5\' 6"  (1.676 m)  Wt 164 lb (74.39 kg)  BMI 26.47 kg/m2  Physical Exam  Nursing note and vitals reviewed. Constitutional: She is oriented to person, place, and time. She appears well-developed and well-nourished.  HENT:  Head: Normocephalic.  Nose: Nose normal.  Mouth/Throat: Oropharynx is clear and moist.  Eyes: Conjunctivae are normal. Pupils are equal, round, and reactive to light.  Neck: Normal range of motion. Neck supple. No JVD present.  Cardiovascular: Normal rate, regular rhythm, S1 normal, S2 normal, normal heart sounds and intact distal pulses.  Exam reveals no gallop and no friction rub.   No murmur heard. Pulmonary/Chest: Effort normal and breath sounds normal. No respiratory distress. She has no wheezes. She has no rales. She exhibits no tenderness.  Abdominal: Soft. Bowel sounds are normal. She exhibits no distension. There is no tenderness.  Musculoskeletal: Normal range of motion. She exhibits no edema and no tenderness.  Lymphadenopathy:    She has no cervical adenopathy.  Neurological: She is alert and oriented to person, place, and time. Coordination normal.  Skin: Skin is warm and  dry. No rash noted. No erythema.  Psychiatric: She has a normal mood and affect. Her behavior is normal. Judgment and thought content normal.         Assessment and Plan

## 2011-11-04 NOTE — Assessment & Plan Note (Signed)
>>  ASSESSMENT AND PLAN FOR DYSLIPIDEMIA WRITTEN ON 11/04/2011  5:50 PM BY Antonieta Iba, MD  She no longer takes Lipitor Secondary to muscle ache and pain in her hands.  We will check her cholesterol and restart cholesterol medication, possibly Crestor 5 mg daily.

## 2011-11-04 NOTE — Assessment & Plan Note (Signed)
Palpitations have been well controlled on metoprolol tartrate in the evening with occasional diltiazem p.r.n.

## 2011-11-04 NOTE — Assessment & Plan Note (Signed)
Tachycardia and palpitations are better with metoprolol and diltiazem

## 2011-11-04 NOTE — Patient Instructions (Addendum)
You are doing well. No medication changes were made.  Please call us if you have new issues that need to be addressed before your next appt.  Your physician wants you to follow-up in: 1 year.  You will receive a reminder letter in the mail two months in advance. If you don't receive a letter, please call our office to schedule the follow-up appointment.   

## 2011-12-04 ENCOUNTER — Emergency Department: Payer: Self-pay | Admitting: Emergency Medicine

## 2011-12-04 LAB — COMPREHENSIVE METABOLIC PANEL
Albumin: 3.5 g/dL (ref 3.4–5.0)
Anion Gap: 8 (ref 7–16)
BUN: 15 mg/dL (ref 7–18)
Calcium, Total: 8.6 mg/dL (ref 8.5–10.1)
Co2: 28 mmol/L (ref 21–32)
EGFR (African American): >60
EGFR (Non-African Amer.): >60
Glucose: 108 mg/dL — ABNORMAL HIGH (ref 65–99)
Potassium: 3.5 mmol/L (ref 3.5–5.1)
SGOT(AST): 19 U/L (ref 15–37)
Sodium: 139 mmol/L (ref 136–145)

## 2011-12-04 LAB — CBC WITH DIFFERENTIAL/PLATELET
Basophil #: 0 10*3/uL (ref 0.0–0.1)
Eosinophil #: 0.2 10*3/uL (ref 0.0–0.7)
HCT: 41.2 % (ref 35.0–47.0)
HGB: 13.9 g/dL (ref 12.0–16.0)
Lymphocyte #: 4 10*3/uL — ABNORMAL HIGH (ref 1.0–3.6)
MCH: 28.7 pg (ref 26.0–34.0)
MCHC: 33.6 g/dL (ref 32.0–36.0)
MCV: 86 fL (ref 80–100)
Monocyte #: 0.6 10*3/uL (ref 0.0–0.7)
Neutrophil %: 52.5 %
Platelet: 223 10*3/uL (ref 150–440)
RBC: 4.82 10*6/uL (ref 3.80–5.20)
RDW: 13.6 % (ref 11.5–14.5)
WBC: 10.1 10*3/uL (ref 3.6–11.0)

## 2011-12-04 LAB — CK TOTAL AND CKMB (NOT AT ARMC)
CK, Total: 63 U/L (ref 21–215)
CK-MB: <0.5 ng/mL — ABNORMAL LOW (ref 0.5–3.6)

## 2011-12-04 LAB — TROPONIN I: Troponin-I: <0.02 ng/mL

## 2011-12-07 ENCOUNTER — Emergency Department (HOSPITAL_COMMUNITY)
Admission: EM | Admit: 2011-12-07 | Discharge: 2011-12-08 | Disposition: A | Discharge status: Home / Self Care | Payer: Federal, State, Local not specified - PPO | Attending: Emergency Medicine | Admitting: Emergency Medicine

## 2011-12-07 ENCOUNTER — Emergency Department (HOSPITAL_COMMUNITY): Payer: Federal, State, Local not specified - PPO

## 2011-12-07 ENCOUNTER — Encounter (HOSPITAL_COMMUNITY): Payer: Self-pay | Admitting: *Deleted

## 2011-12-07 DIAGNOSIS — R0602 Shortness of breath: Secondary | ICD-10-CM | POA: Insufficient documentation

## 2011-12-07 DIAGNOSIS — R002 Palpitations: Secondary | ICD-10-CM | POA: Insufficient documentation

## 2011-12-07 DIAGNOSIS — Z79899 Other long term (current) drug therapy: Secondary | ICD-10-CM | POA: Insufficient documentation

## 2011-12-07 DIAGNOSIS — Z8542 Personal history of malignant neoplasm of other parts of uterus: Secondary | ICD-10-CM | POA: Insufficient documentation

## 2011-12-07 DIAGNOSIS — R079 Chest pain, unspecified: Secondary | ICD-10-CM

## 2011-12-07 DIAGNOSIS — R0789 Other chest pain: Secondary | ICD-10-CM | POA: Insufficient documentation

## 2011-12-07 DIAGNOSIS — R1013 Epigastric pain: Secondary | ICD-10-CM | POA: Insufficient documentation

## 2011-12-07 DIAGNOSIS — E7800 Pure hypercholesterolemia, unspecified: Secondary | ICD-10-CM | POA: Insufficient documentation

## 2011-12-07 HISTORY — DX: Atrioventricular block, complete: I44.2

## 2011-12-07 HISTORY — DX: Pure hypercholesterolemia, unspecified: E78.00

## 2011-12-07 LAB — CBC
MCV: 85.4 fL (ref 78.0–100.0)
Platelets: 207 10*3/uL (ref 150–400)
RDW: 13.1 % (ref 11.5–15.5)
WBC: 9.8 10*3/uL (ref 4.0–10.5)

## 2011-12-07 LAB — DIFFERENTIAL
Basophils Absolute: 0 10*3/uL (ref 0.0–0.1)
Eosinophils Relative: 1 % (ref 0–5)
Lymphocytes Relative: 31 % (ref 12–46)
Neutrophils Relative %: 62 % (ref 43–77)

## 2011-12-07 LAB — COMPREHENSIVE METABOLIC PANEL
ALT: 14 U/L (ref 0–35)
AST: 17 U/L (ref 0–37)
CO2: 24 mEq/L (ref 19–32)
Calcium: 9.3 mg/dL (ref 8.4–10.5)
GFR calc non Af Amer: >90 mL/min (ref >90)
Sodium: 138 mEq/L (ref 135–145)
Total Protein: 7.2 g/dL (ref 6.0–8.3)

## 2011-12-07 LAB — URINALYSIS, ROUTINE W REFLEX MICROSCOPIC
Bilirubin Urine: NEGATIVE
Hgb urine dipstick: NEGATIVE
Specific Gravity, Urine: 1.013 (ref 1.005–1.030)
pH: 6 (ref 5.0–8.0)

## 2011-12-07 LAB — PROTIME-INR
INR: 0.97 (ref 0.00–1.49)
Prothrombin Time: 13.1 seconds (ref 11.6–15.2)

## 2011-12-07 MED ORDER — SODIUM CHLORIDE 0.9 % IV SOLN
INTRAVENOUS | Status: DC
  Administered 2011-12-07: 17:00:00 via INTRAVENOUS

## 2011-12-07 MED ORDER — ASPIRIN 81 MG PO CHEW
324.0000 mg | CHEWABLE_TABLET | Freq: Once | ORAL | Status: AC
  Administered 2011-12-07: 324 mg via ORAL
  Filled 2011-12-07: qty 4

## 2011-12-07 MED ORDER — ONDANSETRON HCL 4 MG/2ML IJ SOLN
4.0000 mg | Freq: Once | INTRAMUSCULAR | Status: AC
  Administered 2011-12-07: 4 mg via INTRAVENOUS
  Filled 2011-12-07: qty 2

## 2011-12-07 MED ORDER — MORPHINE SULFATE 4 MG/ML IJ SOLN
4.0000 mg | Freq: Once | INTRAMUSCULAR | Status: AC
  Administered 2011-12-07: 4 mg via INTRAVENOUS
  Filled 2011-12-07: qty 1

## 2011-12-07 MED ORDER — DILTIAZEM HCL 30 MG PO TABS: 30.0000 mg | ORAL_TABLET | Freq: Four times a day (QID) | ORAL | Status: DC

## 2011-12-07 MED ORDER — HYDROMORPHONE HCL PF 1 MG/ML IJ SOLN
1.0000 mg | Freq: Once | INTRAMUSCULAR | Status: AC
  Administered 2011-12-07: 1 mg via INTRAVENOUS
  Filled 2011-12-07: qty 1

## 2011-12-07 MED ORDER — LORAZEPAM 2 MG/ML IJ SOLN
1.0000 mg | Freq: Once | INTRAMUSCULAR | Status: AC
  Administered 2011-12-07: 1 mg via INTRAVENOUS
  Filled 2011-12-07: qty 1

## 2011-12-07 NOTE — ED Notes (Signed)
Pt now states abdominal pain radiating up to chest. No relief from pain with medication. Pt moaning and unable to sit still. Vital signs remain stable.

## 2011-12-07 NOTE — ED Notes (Signed)
Dr Ignacia Palma to bedside to discuss plan of care and discharge.

## 2011-12-07 NOTE — ED Provider Notes (Addendum)
History     CSN: [id number]  Arrival date & time 12/07/11  1418   First MD Initiated Contact with Patient 12/07/11 1529      Chief Complaint  Patient presents with  . Chest Pain    (Consider location/radiation/quality/duration/timing/severity/associated sxs/prior treatment) HPI Comments: Patient's husband gives her history. She had chest pain on Wednesday, 4 days ago, and had evaluation at [facility]. Apparently her testing was negative and she was released the same day. Last night she had a lot of pain in her chest, in the precordial region and going towards the left axilla. There was difficulty breathing and nausea. She went to an urgent care facility earlier today and was sent here because she does not have some sort of obstruction in her heart. Husband says her main complaint currently is chest pain and shortness of breath. Review of her prior charts shows a history of palpitations, for which she is on metoprolol. She has also been on atorvastatin for high cholesterol, but this was stopped because of joint pain. On a recent visit to Dr. Mariah Milling and was advised to take Crestor.    Patient is a 46 y.o. female presenting with chest pain. The history is provided by the patient and medical records. No language interpreter was used.  Chest Pain The chest pain began yesterday. Duration of episode(s) is 18 hours. Chest pain occurs constantly. The chest pain is unchanged. Associated with: Nothing. At its most intense, the pain is at 8/10. The pain is currently at 8/10. The severity of the pain is severe. The quality of the pain is described as aching. Radiates to: Left axilla. Exacerbated by: Nothing. Primary symptoms include shortness of breath. Pertinent negatives for primary symptoms include no fever. Primary symptoms comment: Chest pain and shortness of breath. Treatments tried: She has taken her own medications without relief. Risk factors include stress. Past medical history  comments: Palpitations Procedure history comments: Holter monitor..     Past Medical History  Diagnosis Date  . Cancer, uterine 1998  . Chest pain     normal LV function by echo 10/2010  . Palpitations   . Idioventricular rhythm   . High cholesterol     Past Surgical History  Procedure Date  . Wrist surgery     broken wrist,left  . Cholecystectomy   . Cesarean section     x2  . Appendectomy   . Surgery on face     AFTER FALL    Family History  Problem Relation Age of Onset  . Hypertension Mother   . Heart disease Father     History  Substance Use Topics  . Smoking status: Former Smoker -- 1.0 packs/day for 5 years    Types: Cigarettes    Quit date: 02/11/2006  . Smokeless tobacco: Never Used  . Alcohol Use: Yes     OCCASIONAL    OB History    Grav Para Term Preterm Abortions TAB SAB Ect Mult Living                  Review of Systems  Constitutional: Negative for fever and chills.  HENT: Negative.   Respiratory: Positive for shortness of breath.   Cardiovascular: Positive for chest pain.  Gastrointestinal: Negative.   Genitourinary: Negative.   Musculoskeletal: Negative.   Neurological: Negative.   Psychiatric/Behavioral: The patient is nervous/anxious.     Allergies  Review of patient's allergies indicates no known allergies.  Home Medications   Current Outpatient Rx  Name Route Sig Dispense Refill  . ALBUTEROL SULFATE HFA 108 (90 BASE) MCG/ACT IN AERS Inhalation Inhale 2 puffs into the lungs every 6 (six) hours as needed. For shortness of breath    . DILTIAZEM HCL 30 MG PO TABS Oral Take 30-60 mg by mouth daily as needed. For tachycardia    . METOPROLOL TARTRATE 25 MG PO TABS Oral Take 25 mg by mouth 2 (two) times daily as needed. For blood pressure; Directions in spanish      BP 131/73  Pulse 75  Temp(Src) 97.6 F (36.4 C) (Oral)  Resp 22  SpO2 99%  Physical Exam  Nursing note and vitals reviewed. Constitutional: She is oriented to  person, place, and time.       Anxious-appearing woman in moderate distress.  HENT:  Head: Normocephalic and atraumatic.  Right Ear: External ear normal.  Left Ear: External ear normal.  Mouth/Throat: Oropharynx is clear and moist.  Eyes: Conjunctivae and EOM are normal. Pupils are equal, round, and reactive to light.  Neck: Normal range of motion. Neck supple. No JVD present.  Cardiovascular: Normal rate, regular rhythm and normal heart sounds.   Pulmonary/Chest: Effort normal and breath sounds normal.  Abdominal: Soft. Bowel sounds are normal.  Musculoskeletal: Normal range of motion. She exhibits no edema and no tenderness.  Lymphadenopathy:    She has no cervical adenopathy.  Neurological: She is alert and oriented to person, place, and time.       No sensory or motor deficit.  Skin: Skin is warm and dry.  Psychiatric:       Anxious, mildly tearful.     ED Course  Procedures (including critical care time)  3:51 PM Patient seen, physical exam performed. Old charts reviewed. Laboratory tests and EKG and chest x-ray ordered. IV fluids, oxygen, aspirin, morphine, and Ativan ordered.  4:02 PM  Date: 12/07/2011  Rate: 67  Rhythm: normal sinus rhythm  QRS Axis: normal  Intervals: normal  ST/T Wave abnormalities: normal  Conduction Disutrbances:none  Narrative Interpretation: Normal EKG  Old EKG Reviewed: none available 5:43 PM Results for orders placed during the hospital encounter of 12/07/11  CBC      Component Value Range   WBC 9.8  4.0 - 10.5 (K/uL)   RBC 5.07  3.87 - 5.11 (MIL/uL)   Hemoglobin 14.2  12.0 - 15.0 (g/dL)   HCT 16.1  09.6 - 04.5 (%)   MCV 85.4  78.0 - 100.0 (fL)   MCH 28.0  26.0 - 34.0 (pg)   MCHC 32.8  30.0 - 36.0 (g/dL)   RDW 40.9  81.1 - 91.4 (%)   Platelets 207  150 - 400 (K/uL)  DIFFERENTIAL      Component Value Range   Neutrophils Relative 62  43 - 77 (%)   Neutro Abs 6.1  1.7 - 7.7 (K/uL)   Lymphocytes Relative 31  12 - 46 (%)   Lymphs Abs  3.1  0.7 - 4.0 (K/uL)   Monocytes Relative 5  3 - 12 (%)   Monocytes Absolute 0.5  0.1 - 1.0 (K/uL)   Eosinophils Relative 1  0 - 5 (%)   Eosinophils Absolute 0.1  0.0 - 0.7 (K/uL)   Basophils Relative 0  0 - 1 (%)   Basophils Absolute 0.0  0.0 - 0.1 (K/uL)  COMPREHENSIVE METABOLIC PANEL      Component Value Range   Sodium 138  135 - 145 (mEq/L)   Potassium 3.4 (*) 3.5 - 5.1 (mEq/L)  Chloride 104  96 - 112 (mEq/L)   CO2 24  19 - 32 (mEq/L)   Glucose, Bld 90  70 - 99 (mg/dL)   BUN 11  6 - 23 (mg/dL)   Creatinine, Ser 1.19  0.50 - 1.10 (mg/dL)   Calcium 9.3  8.4 - 14.7 (mg/dL)   Total Protein 7.2  6.0 - 8.3 (g/dL)   Albumin 3.7  3.5 - 5.2 (g/dL)   AST 17  0 - 37 (U/L)   ALT 14  0 - 35 (U/L)   Alkaline Phosphatase 75  39 - 117 (U/L)   Total Bilirubin 0.4  0.3 - 1.2 (mg/dL)   GFR calc non Af Amer >90  >90 (mL/min)   GFR calc Af Amer >90  >90 (mL/min)  URINALYSIS, ROUTINE W REFLEX MICROSCOPIC      Component Value Range   Color, Urine YELLOW  YELLOW    APPearance CLOUDY (*) CLEAR    Specific Gravity, Urine 1.013  1.005 - 1.030    pH 6.0  5.0 - 8.0    Glucose, UA NEGATIVE  NEGATIVE (mg/dL)   Hgb urine dipstick NEGATIVE  NEGATIVE    Bilirubin Urine NEGATIVE  NEGATIVE    Ketones, ur 15 (*) NEGATIVE (mg/dL)   Protein, ur NEGATIVE  NEGATIVE (mg/dL)   Urobilinogen, UA 0.2  0.0 - 1.0 (mg/dL)   Nitrite NEGATIVE  NEGATIVE    Leukocytes, UA NEGATIVE  NEGATIVE   CARDIAC PANEL(CRET KIN+CKTOT+MB+TROPI)      Component Value Range   Total CK 46  7 - 177 (U/L)   CK, MB 1.4  0.3 - 4.0 (ng/mL)   Troponin I <0.30  <0.30 (ng/mL)   Relative Index RELATIVE INDEX IS INVALID  0.0 - 2.5   D-DIMER, QUANTITATIVE      Component Value Range   D-Dimer, Quant 0.54 (*) 0.00 - 0.48 (ug/mL-FEU)  APTT      Component Value Range   aPTT 33  24 - 37 (seconds)  PROTIME-INR      Component Value Range   Prothrombin Time 13.1  11.6 - 15.2 (seconds)   INR 0.97  0.00 - 1.49    Dg Chest 2 View  12/07/2011   *RADIOLOGY REPORT*  Clinical Data: Chest pain, history of palpitations  CHEST - 2 VIEW  Comparison: None.  Findings: Cardiomediastinal silhouette is unremarkable.  No acute infiltrate or pleural effusion.  No pulmonary edema.  Bony thorax is unremarkable.  IMPRESSION: No active disease.  Original Report Authenticated By: Natasha Mead, M.D.    Pt's lab tests showed no heart attack.  Her D-dimer is slightly elevated, and she had had CT angio of chest at [facility] several days ago.  Will request result of that test.  Her pain now seems more epigastic.  Will remedicate for pain, and order abdominal ultrasound to check for possible gall bladder disease.   6:22 PM CT angio of chest on February 6th was negative for PE.    7:50 PM They are ready for pt in ultrasound.  9:49 PM Abdominal ultrasound is negative.  At present, pt is resting comfortably, not in pain.  Call to [facility] Cardiology --> 11:33 PM Pt seen by Dr. Antoine Poche --> pt can go home, advised adding Cardizem 30 mg qid.    1. Chest pain            Carleene Cooper III, MD 12/08/11 0123  Carleene Cooper III, MD 12/08/11 [phone]

## 2011-12-07 NOTE — Consult Note (Signed)
CARDIOLOGY ADMISSION NOTE  Patient ID: Lori [name] MRN: [id number] DOB/AGE: 28-Apr-1966 46 y.o.  Admit date: 12/07/2011 Primary Physician   [facility] Primary Cardiologist   Dr. Mariah Milling Chief Complaint    Chest Pain  HPI: The patient presents with chest pain and palpitations. She has had a history of this. Her pain was felt to be atypical. She has had palpitations treated with calcium channel blockers and more recently beta blockers. She was taking both of them together for a while and was told to only take the beta blocker. She thinks a calcium channel blocker actually work better. Last night she was short of breath. He's been having more palpitations. She was having soreness in her chest and under her left arm. She had actually presented to [facility] a couple of days ago and she had a CT which was negative for evidence of pulmonary emboli or other acute disease. She went to an urgent care today with complaints of chest discomfort and was told she had a "abnormal EKG". However, I don't have the strips. She thinks there might have been some arrhythmia. She came to [facility]. Here she has continued to have chest discomfort. It is reproducible with palpation. It hurts to move. He reports shortness of breath her oxygenation is 100% on room air. She's had some nausea. He's not had any presyncope or syncope. There has been no PND or orthopnea. She says the discomfort that she is having his pressure and severe with some radiation around to her back. She doesn't think never had pain like this before.  Past Medical History  Diagnosis Date  . Cancer, uterine 1998  . Chest pain     normal LV function by echo 10/2010  . Palpitations   . Idioventricular rhythm   . High cholesterol     Past Surgical History  Procedure Date  . Wrist surgery     broken wrist,left  . Cholecystectomy   . Cesarean section     x2  . Appendectomy   . Surgery on face     AFTER FALL    No Known  Allergies  No current facility-administered medications on file prior to encounter.   Current Outpatient Prescriptions on File Prior to Encounter  Medication Sig Dispense Refill  . albuterol (PROVENTIL HFA;VENTOLIN HFA) 108 (90 BASE) MCG/ACT inhaler Inhale 2 puffs into the lungs every 6 (six) hours as needed. For shortness of breath      . Metoprolol 25 mg qid prn       History   Social History  . Marital Status: Married    Spouse Name: N/A    Number of Children: N/A  . Years of Education: N/A   Occupational History  . Not on file.   Social History Main Topics  . Smoking status: Former Smoker -- 1.0 packs/day for 5 years    Types: Cigarettes    Quit date: 02/11/2006  . Smokeless tobacco: Never Used  . Alcohol Use: Yes     OCCASIONAL  . Drug Use: No  . Sexually Active: Not on file   Other Topics Concern  . Not on file   Social History Narrative  . No narrative on file    Family History  Problem Relation Age of Onset  . Hypertension Mother   . Heart disease Father 50    MI    ROS:   As stated in the HPI and negative for all other systems.  Physical Exam: Blood pressure  111/71, pulse 64, temperature 97.7 F (36.5 C), temperature source Oral, resp. rate 14, last menstrual period 11/23/2011, SpO2 100.00%.  GENERAL:  Well appearing HEENT:  Pupils equal round and reactive, fundi not visualized, oral mucosa unremarkable NECK:  No jugular venous distention, waveform within normal limits, carotid upstroke brisk and symmetric, no bruits, no thyromegaly LYMPHATICS:  No cervical, inguinal adenopathy LUNGS:  Clear to auscultation bilaterally BACK:  No CVA tenderness CHEST:  Tender to palpation reproducing her symptoms left chest and left axilla  HEART:  PMI not displaced or sustained,S1 and S2 within normal limits, no S3, no S4, no clicks, no rubs, no murmurs ABD:  Flat, positive bowel sounds normal in frequency in pitch, no bruits, no rebound, no guarding, no midline  pulsatile mass, no hepatomegaly, no splenomegaly EXT:  2 plus pulses throughout, no edema, no cyanosis no clubbing SKIN:  No rashes no nodules NEURO:  Cranial nerves II through XII grossly intact, motor grossly intact throughout PSYCH:  Cognitively intact, oriented to person place and time  Labs: Lab Results  Component Value Date   BUN 11 12/07/2011   Lab Results  Component Value Date   CREATININE 0.58 12/07/2011   Lab Results  Component Value Date   NA 138 12/07/2011   K 3.4* 12/07/2011   CL 104 12/07/2011   CO2 24 12/07/2011   Lab Results  Component Value Date   CKTOTAL 46 12/07/2011   CKMB 1.4 12/07/2011   TROPONINI <0.30 12/07/2011   Lab Results  Component Value Date   WBC 9.8 12/07/2011   HGB 14.2 12/07/2011   HCT 43.3 12/07/2011   MCV 85.4 12/07/2011   PLT 207 12/07/2011    Lab Results  Component Value Date   ALT 14 12/07/2011   AST 17 12/07/2011   ALKPHOS 75 12/07/2011   BILITOT 0.4 12/07/2011   Radiology:  CXR:  NAD,      Abdominal US:  Surgically absent gallbladder. Negative for biliary duct  dilatation.  Question mild hydronephrosis left kidney versus parapelvic cysts.  EKG:  Sinus rhythm, rate 67, axis within normal limits, intervals within normal limits, no acute ST-T wave changes.   ASSESSMENT AND PLAN:    1)  Chest pain:   The pain is very atypical for angina. It is reproducible with palpation. It seems to be musculoskeletal. There is no objective evidence of ischemia. At this point the pretest probability of obstructive coronary disease is extremely low. No further cardiovascular testing is suggested.   2)  Palpitations:  The patient has a long history of this. She referred the Cardizem. She can have 30 mg Q82 every 6 hours when necessary and hold metoprolol until she has a chance to talk again with Dr. Mariah Milling  Signed: Rollene Rotunda 12/07/2011, 11:02 PM

## 2011-12-07 NOTE — ED Notes (Signed)
MD to bedside to give results and update on plan of care.

## 2011-12-07 NOTE — ED Notes (Signed)
Pt presents to department for evaluation of L sided non radiating chest pain. Ongoing for several days. Nothing makes pain better at home. 9/10 pain upon arrival to ED. Describes as "burning" sensation. Also states SOB on exertion. Lung sounds clear and equal bilaterally. Skin warm and dry. Pt speaking complete sentences. Pt is conscious alert and oriented x4.

## 2011-12-07 NOTE — ED Notes (Signed)
Pt resting quietly at the time. Medicated for chest and abdominal pain. Urine sent to lab for testing. Husband remains at bedside. Vital signs stable.

## 2011-12-07 NOTE — ED Notes (Signed)
repors having left side chest pain, has been seen at [place] recently for same. Reports its a burning pain to chest and sob. ekg done at triage. resp e/u.

## 2011-12-07 NOTE — ED Notes (Signed)
Patient transported to Ultrasound 

## 2011-12-07 NOTE — ED Notes (Signed)
Pt resting. Vitals signs stable. Denies pain at present. Family remains at bedside. Remains alert and oriented x4. No signs of distress noted.

## 2011-12-07 NOTE — ED Notes (Signed)
Cardiology at bedside for consult

## 2011-12-08 LAB — URINE CULTURE
Colony Count: 2000
Culture  Setup Time: 201302092110

## 2011-12-09 ENCOUNTER — Telehealth: Payer: Self-pay | Admitting: Cardiovascular Disease

## 2011-12-09 LAB — HEPATIC FUNCTION PANEL
ALT: 22 IU/L (ref 0–32)
AST: 17 IU/L (ref 0–40)
Bilirubin, Direct: 0.11 mg/dL (ref 0.00–0.40)
Total Bilirubin: 0.4 mg/dL (ref 0.0–1.2)

## 2011-12-09 LAB — LIPID PANEL
Cholesterol, Total: 201 mg/dL — ABNORMAL HIGH (ref 100–199)
Triglycerides: 122 mg/dL (ref 0–149)

## 2011-12-09 NOTE — Telephone Encounter (Signed)
Talked to [name]'s daughter, she states that her mother, Lori [name], has an appt with her cardiologist today.

## 2011-12-09 NOTE — Telephone Encounter (Signed)
Pt came into the office stating that she is having arrythmia and went to [facility] [place]. She also went to [facility] and they told her to go to the ED. Pt would like to speak to a nurse about what she should do now.

## 2011-12-09 NOTE — Telephone Encounter (Signed)
I spoke to this pt again, she just wants to make an appointment to see Dr. Mariah Milling, not necessarily today, but soon.

## 2011-12-09 NOTE — Telephone Encounter (Signed)
Pt daughter called stating that her mother is having arrythmia's again. Instructed pt to return to the ED.

## 2011-12-09 NOTE — Telephone Encounter (Signed)
Pt does not have an appt today with Dr Mariah Milling. Pt walked in the office wanting to be seen today. Dr Mariah Milling is not in the office this morning. Pt d/c instruction says that she would need to f/u with Dr. Mariah Milling, Pt was seen by Dr Antoine Poche at [facility] over the weekend and numerous test were done which we all normal. Pt feels she should be seen today but Dr. Mariah Milling isnt here.

## 2011-12-30 ENCOUNTER — Ambulatory Visit: Payer: Federal, State, Local not specified - PPO | Admitting: Cardiovascular Disease

## 2012-01-27 ENCOUNTER — Encounter: Payer: Self-pay | Admitting: Cardiovascular Disease

## 2012-02-21 ENCOUNTER — Other Ambulatory Visit: Payer: Self-pay | Admitting: *Deleted

## 2012-02-21 MED ORDER — DILTIAZEM HCL 30 MG PO TABS: 30.0000 mg | ORAL_TABLET | Freq: Every day | ORAL | Status: DC | PRN

## 2012-02-28 ENCOUNTER — Emergency Department: Payer: Self-pay | Admitting: Emergency Medicine

## 2012-02-28 LAB — COMPREHENSIVE METABOLIC PANEL
Alkaline Phosphatase: 72 U/L (ref 50–136)
Anion Gap: 8 (ref 7–16)
BUN: 21 mg/dL — ABNORMAL HIGH (ref 7–18)
Chloride: 106 mmol/L (ref 98–107)
Co2: 25 mmol/L (ref 21–32)
EGFR (African American): >60
EGFR (Non-African Amer.): >60
Glucose: 93 mg/dL (ref 65–99)
Potassium: 3.8 mmol/L (ref 3.5–5.1)
SGOT(AST): 12 U/L — ABNORMAL LOW (ref 15–37)
SGPT (ALT): 26 U/L
Total Protein: 7.3 g/dL (ref 6.4–8.2)

## 2012-02-28 LAB — CBC
HGB: 13.1 g/dL (ref 12.0–16.0)
MCV: 87 fL (ref 80–100)
Platelet: 221 10*3/uL (ref 150–440)
RBC: 4.64 10*6/uL (ref 3.80–5.20)

## 2012-02-28 LAB — TROPONIN I: Troponin-I: <0.02 ng/mL

## 2012-02-29 LAB — TROPONIN I: Troponin-I: <0.02 ng/mL

## 2012-03-10 ENCOUNTER — Ambulatory Visit (INDEPENDENT_AMBULATORY_CARE_PROVIDER_SITE_OTHER): Payer: Federal, State, Local not specified - PPO | Admitting: Cardiovascular Disease

## 2012-03-10 ENCOUNTER — Encounter: Payer: Self-pay | Admitting: Cardiovascular Disease

## 2012-03-10 DIAGNOSIS — I499 Cardiac arrhythmia, unspecified: Secondary | ICD-10-CM

## 2012-03-10 DIAGNOSIS — R0602 Shortness of breath: Secondary | ICD-10-CM

## 2012-03-10 DIAGNOSIS — R079 Chest pain, unspecified: Secondary | ICD-10-CM

## 2012-03-10 DIAGNOSIS — R000 Tachycardia, unspecified: Secondary | ICD-10-CM

## 2012-03-10 MED ORDER — ISOSORBIDE MONONITRATE ER 30 MG PO TB24: 30.0000 mg | ORAL_TABLET | Freq: Every day | ORAL | Status: DC | PRN

## 2012-03-10 MED ORDER — NITROGLYCERIN 0.4 MG SL SUBL: 0.4000 mg | SUBLINGUAL_TABLET | SUBLINGUAL | Status: DC | PRN

## 2012-03-10 NOTE — Progress Notes (Signed)
Patient ID: Lori [name], female    DOB: October 30, 1965, 46 y.o.   MRN: [id number]  HPI Comments: Lori [name] is a pleasant 46 year old woman who is predominantly Spanish-speaking who presents for follow up evaluation of tachycardia,  shortness of breath with chest pain.  In the past, she did complete 2 weeks of a event monitor that did not show any significant arrhythmia. We certainly could have missed any events that she was unable to continue to monitor secondary to skin irritation from the EKG leads.  She has been taking diltiazem 1, sometimes 2 pills every day. She did have significant symptoms in January 2013. This seemed to resolve and February through May she was doing well until recently. 2 weeks ago, she had severe chest pain. She describes this as a cramp in the left side of her chest that was deep. She does not feel that it was muscle. She took diltiazem with no relief and she went to the emergency room. Symptoms there seemed to resolve and she was discharged home with normal blood work. She has not had any further episodes since that time  In the past, she has been anxious at nighttime. She is "afraid of the dark". That is when most of her palpitations seem to happen. Most of her complaints are about chest pain and less palpitations on this visit. She is now taking metoprolol propranolol but both have been prescribed in the past.     EKG today shows normal sinus rhythm with rate 65  beats per minute,  no significant ST or T wave changes     Outpatient Encounter Prescriptions as of 03/10/2012  Medication Sig Dispense Refill  . albuterol (PROVENTIL HFA;VENTOLIN HFA) 108 (90 BASE) MCG/ACT inhaler Inhale 2 puffs into the lungs every 6 (six) hours as needed. For shortness of breath      . diltiazem (CARDIZEM) 30 MG tablet Take 30-60 mg by mouth daily. For tachycardia      . ESTAZOLAM PO Take 20 mg by mouth daily.      Marland Kitchen DISCONTD: diltiazem (CARDIZEM) 30 MG tablet Take 1-2 tablets (30-60 mg  total) by mouth daily as needed. For tachycardia  60 tablet  6  . isosorbide mononitrate (IMDUR) 30 MG 24 hr tablet Take 1 tablet (30 mg total) by mouth daily as needed.  30 tablet  3  . nitroGLYCERIN (NITROSTAT) 0.4 MG SL tablet Place 1 tablet (0.4 mg total) under the tongue every 5 (five) minutes as needed for chest pain.  25 tablet  6   Review of Systems  Constitutional: Negative.   HENT: Negative.   Eyes: Negative.   Respiratory: Negative.   Cardiovascular: Positive for chest pain.  Gastrointestinal: Negative.   Musculoskeletal: Negative.   Skin: Negative.   Neurological: Negative.   Hematological: Negative.   Psychiatric/Behavioral: The patient is nervous/anxious.   All other systems reviewed and are negative.    BP 109/70  Pulse 65  Ht 5\' 4"  (1.626 m)  Wt 161 lb (73.029 kg)  BMI 27.64 kg/m2  Physical Exam  Nursing note and vitals reviewed. Constitutional: She is oriented to person, place, and time. She appears well-developed and well-nourished.  HENT:  Head: Normocephalic.  Nose: Nose normal.  Mouth/Throat: Oropharynx is clear and moist.  Eyes: Conjunctivae are normal. Pupils are equal, round, and reactive to light.  Neck: Normal range of motion. Neck supple. No JVD present.  Cardiovascular: Normal rate, regular rhythm, S1 normal, S2 normal, normal heart sounds and intact distal pulses.  Exam reveals no gallop and no friction rub.   No murmur heard. Pulmonary/Chest: Effort normal and breath sounds normal. No respiratory distress. She has no wheezes. She has no rales. She exhibits no tenderness.  Abdominal: Soft. Bowel sounds are normal. She exhibits no distension. There is no tenderness.  Musculoskeletal: Normal range of motion. She exhibits no edema and no tenderness.  Lymphadenopathy:    She has no cervical adenopathy.  Neurological: She is alert and oriented to person, place, and time. Coordination normal.  Skin: Skin is warm and dry. No rash noted. No erythema.    Psychiatric: She has a normal mood and affect. Her behavior is normal. Judgment and thought content normal.         Assessment and Plan

## 2012-03-10 NOTE — Assessment & Plan Note (Signed)
Suspect episodic sinus tachycardia. We have not been able to identify any significant arrhythmia on Holter. We have suggested she use propranolol or metoprolol when necessary for tachycardia, in addition to diltiazem.

## 2012-03-10 NOTE — Assessment & Plan Note (Signed)
Recent severe episode of chest pain and with normal EKG and lab work at [facility]. Symptoms concerning for spasm as otherwise she does well, symptoms are intermittent, not brought on with exertion. She has been taking diltiazem for possible spasm and palpitations. We will give her nitroglycerin sublingual and isosorbide  mononitrate 30 mg to take when necessary if symptoms do not resolve.

## 2012-03-10 NOTE — Patient Instructions (Signed)
You are doing well. Please take nitroglycerin for chest pain as  It is ok to take nitro every 5 minutes as needed for severe pain, up to three pils If you continue to have chest pain that does resolve, take isosorbide pil. This can last all day.  Please call us if you have new issues that need to be addressed before your next appt.  Your physician wants you to follow-up in: 6 months.  You will receive a reminder letter in the mail two months in advance. If you don't receive a letter, please call our office to schedule the follow-up appointment.

## 2012-06-19 ENCOUNTER — Ambulatory Visit (INDEPENDENT_AMBULATORY_CARE_PROVIDER_SITE_OTHER): Payer: Federal, State, Local not specified - PPO

## 2012-06-19 ENCOUNTER — Telehealth: Payer: Self-pay

## 2012-06-19 VITALS — BP 110/70 | HR 76 | Ht 65.0 in | Wt 155.5 lb

## 2012-06-19 DIAGNOSIS — R079 Chest pain, unspecified: Secondary | ICD-10-CM

## 2012-06-19 NOTE — Telephone Encounter (Signed)
Message copied by Marcelle Overlie on Fri Jun 19, 2012  2:44 PM ------      Message from: Antonieta Iba      Created: Fri Jun 19, 2012  1:54 PM       Echo in January 2012 was essentially normal      It is her choice if she would like a repeat      Possibly set up in [place] with bubble study to look for PFO            ----- Message -----         From: Marcelle Overlie, RN         Sent: 06/19/2012  10:02 AM           To: Antonieta Iba, MD            The records pt brought were from 2001(echo, tilt, etc)      She wants to know if we could repeat echo, especially since she has family hx sudden cardiac death and u/k (to her) PFO            Is there any way we could just order the echo to help ease her mind? (last echo was 2012, which did not show PFO) ????      What are your thoughts?      Thanks!

## 2012-06-19 NOTE — Progress Notes (Signed)
Pt walked in office, accompanied by her daughter and 47 y.o son.  She is spanish-speaking so dtr was able to interpret, as well as our CMA, [facility].   Pt brought with her old records from [place] (2001), which were also in spanish.   She says she had an episode of "crushing" chest pain last night, associated with nausea and near syncope.  She was very tearful, frustrated that we have not "found anything wrong with her and always tell her she is stressed".  She showed [place] the records from [place], which when translated, shows small PFO on echo as well as dx of cardiogenic syncope. She voices fear of sudden death, mentioning her cousin who died at 30 y.o of sudden cardiac death and grandfather who died of MI.   EKG reviewed with Dr. Mariah Milling.  I also explained to him pt's frustration and her desire to have [place], since it has been since 2001.  He states EKG normal, no change from previous.  Advises reassurance, to take a baby ASA for PFO, continue to take PRN meds for CP.  He also says he will review records from [place] and we will call her with decision.  I explained this to the pt and her dtr.  They both verbalize understanding and are willing to wait for our call re: testing.  She will keep appt scheduled with Dr. Mariah Milling this month.

## 2012-06-19 NOTE — Telephone Encounter (Signed)
Message sent to FD to schedule

## 2012-06-19 NOTE — Patient Instructions (Addendum)
Keep appointment with Dr. Mariah Milling as scheduled.  We will call you with further instructions after he reviews old records from [place].  Continue medications as ordered and make sure you take ASA 81 mg tablet PO QD for PFO.

## 2012-06-23 ENCOUNTER — Other Ambulatory Visit: Payer: Self-pay

## 2012-06-23 DIAGNOSIS — Q211 Atrial septal defect: Secondary | ICD-10-CM

## 2012-06-24 ENCOUNTER — Other Ambulatory Visit: Payer: Self-pay

## 2012-06-24 DIAGNOSIS — Q211 Atrial septal defect: Secondary | ICD-10-CM

## 2012-06-26 ENCOUNTER — Ambulatory Visit (HOSPITAL_COMMUNITY): Payer: Federal, State, Local not specified - PPO | Attending: Cardiovascular Disease | Admitting: Radiology

## 2012-06-26 DIAGNOSIS — I079 Rheumatic tricuspid valve disease, unspecified: Secondary | ICD-10-CM | POA: Insufficient documentation

## 2012-06-26 DIAGNOSIS — Q2111 Secundum atrial septal defect: Secondary | ICD-10-CM

## 2012-06-26 DIAGNOSIS — I059 Rheumatic mitral valve disease, unspecified: Secondary | ICD-10-CM | POA: Insufficient documentation

## 2012-06-26 DIAGNOSIS — Q211 Atrial septal defect: Secondary | ICD-10-CM

## 2012-06-26 DIAGNOSIS — R072 Precordial pain: Secondary | ICD-10-CM

## 2012-06-26 DIAGNOSIS — I379 Nonrheumatic pulmonary valve disorder, unspecified: Secondary | ICD-10-CM | POA: Insufficient documentation

## 2012-06-26 NOTE — Progress Notes (Signed)
Echocardiogram performed.  

## 2012-07-07 ENCOUNTER — Ambulatory Visit (INDEPENDENT_AMBULATORY_CARE_PROVIDER_SITE_OTHER): Payer: Federal, State, Local not specified - PPO | Admitting: Cardiovascular Disease

## 2012-07-07 ENCOUNTER — Encounter: Payer: Self-pay | Admitting: Cardiovascular Disease

## 2012-07-07 VITALS — BP 100/62 | HR 57 | Ht 64.0 in | Wt 158.6 lb

## 2012-07-07 DIAGNOSIS — R000 Tachycardia, unspecified: Secondary | ICD-10-CM

## 2012-07-07 DIAGNOSIS — R55 Syncope and collapse: Secondary | ICD-10-CM

## 2012-07-07 DIAGNOSIS — E785 Hyperlipidemia, unspecified: Secondary | ICD-10-CM

## 2012-07-07 DIAGNOSIS — Z87898 Personal history of other specified conditions: Secondary | ICD-10-CM | POA: Insufficient documentation

## 2012-07-07 DIAGNOSIS — R079 Chest pain, unspecified: Secondary | ICD-10-CM

## 2012-07-07 DIAGNOSIS — I499 Cardiac arrhythmia, unspecified: Secondary | ICD-10-CM

## 2012-07-07 HISTORY — DX: Personal history of other specified conditions: Z87.898

## 2012-07-07 MED ORDER — NITROGLYCERIN 0.4 MG SL SUBL: 0.4000 mg | SUBLINGUAL_TABLET | SUBLINGUAL | Status: DC | PRN

## 2012-07-07 NOTE — Assessment & Plan Note (Signed)
Details of the syncope are uncertain. We suggested to her husband that she does not likely need a tilt table test as her "passout spell"happened while she was supine. He does report she has had passout spells while standing in the past though the details are unavailable and seemed to happen back in [place] many years ago. Nothing recent.

## 2012-07-07 NOTE — Progress Notes (Signed)
Patient ID: Lori [name], female    DOB: 27-Jun-1966, 46 y.o.   MRN: [id number]  HPI Comments: Lori [name] is a pleasant 46 year old woman who is predominantly Spanish-speaking who presents for follow up evaluation of tachycardia,  shortness of breath with chest pain. She presents today with her husband. She recently presented to the office with an appointment reporting significant chest pain or palpitations. She had old records from [place] that by report documented PFO.  Repeat echocardiogram was done here including bubble study that showed no PFO, essentially normal echocardiogram. Her husband is requesting a tilt table test as she was found by him in bed "unconscious". Details are uncertain but she reports having tachycardia and then "passed out" while in bed.   Previous attempts at identifying her arrhythmia was unsuccessful as she only wore the event monitor for 2 weeks secondary to skin irritation or other issues. They are both requesting repeat event monitor. She continues to have rare episodes of chest pain and takes nitroglycerin when necessary. Symptoms typically with rest.  On previous evaluation, She describes the chest pain as a cramp in the left side of her chest that was deep. She does not feel that it was muscle. She has had several trips to the emergency room for left-sided chest pain, tachycardia. No evidence of an found in the emergency room on evaluation.   In the past, she has been anxious at nighttime. She is "afraid of the dark". That is when most of her palpitations seem to happen. metoprolol and propranolol have been prescribed in the past.     EKG today shows normal sinus rhythm with rate 57  beats per minute,  no significant ST or T wave changes     Outpatient Encounter Prescriptions as of 07/07/2012  Medication Sig Dispense Refill  . albuterol (PROVENTIL HFA;VENTOLIN HFA) 108 (90 BASE) MCG/ACT inhaler Inhale 2 puffs into the lungs every 6 (six) hours as needed. For  shortness of breath      . diltiazem (CARDIZEM) 30 MG tablet Take 30-60 mg by mouth daily. For tachycardia      . ESTAZOLAM PO Take 20 mg by mouth daily.      . isosorbide mononitrate (IMDUR) 30 MG 24 hr tablet Take 1 tablet (30 mg total) by mouth daily as needed.  30 tablet  3  . nitroGLYCERIN (NITROSTAT) 0.4 MG SL tablet Place 1 tablet (0.4 mg total) under the tongue every 5 (five) minutes as needed for chest pain.  25 tablet  6    Review of Systems  Constitutional: Negative.   HENT: Negative.   Eyes: Negative.   Respiratory: Negative.   Cardiovascular: Positive for chest pain and palpitations.  Gastrointestinal: Negative.   Musculoskeletal: Negative.   Skin: Negative.   Neurological: Positive for syncope.  Hematological: Negative.   Psychiatric/Behavioral: The patient is nervous/anxious.   All other systems reviewed and are negative.    BP 100/62  Pulse 57  Ht 5\' 4"  (1.626 m)  Wt 158 lb 9 oz (71.923 kg)  BMI 27.22 kg/m2  Physical Exam  Nursing note and vitals reviewed. Constitutional: She is oriented to person, place, and time. She appears well-developed and well-nourished.  HENT:  Head: Normocephalic.  Nose: Nose normal.  Mouth/Throat: Oropharynx is clear and moist.  Eyes: Conjunctivae are normal. Pupils are equal, round, and reactive to light.  Neck: Normal range of motion. Neck supple. No JVD present.  Cardiovascular: Normal rate, regular rhythm, S1 normal, S2 normal, normal heart sounds and  intact distal pulses.  Exam reveals no gallop and no friction rub.   No murmur heard. Pulmonary/Chest: Effort normal and breath sounds normal. No respiratory distress. She has no wheezes. She has no rales. She exhibits no tenderness.  Abdominal: Soft. Bowel sounds are normal. She exhibits no distension. There is no tenderness.  Musculoskeletal: Normal range of motion. She exhibits no edema and no tenderness.  Lymphadenopathy:    She has no cervical adenopathy.  Neurological: She  is alert and oriented to person, place, and time. Coordination normal.  Skin: Skin is warm and dry. No rash noted. No erythema.  Psychiatric: She has a normal mood and affect. Her behavior is normal. Judgment and thought content normal.         Assessment and Plan

## 2012-07-07 NOTE — Assessment & Plan Note (Signed)
We will try to repeat the 30 day event monitor at her request. If no significant arrhythmia, we would continue to treat her symptoms when necessary with propranolol or diltiazem. Uncertain if she is having any component of panic attack.

## 2012-07-07 NOTE — Assessment & Plan Note (Signed)
She continues to have periodic episodes of left-sided chest pain. We have suggested she continue on when necessary nitroglycerin.

## 2012-07-07 NOTE — Patient Instructions (Signed)
We will order a 30 days monitor,  Lab work today   Please call us if you have new issues that need to be addressed before your next appt.  Your physician wants you to follow-up in: 6 weeks with Dr. Berton Mount

## 2012-07-07 NOTE — Assessment & Plan Note (Signed)
We will check her cholesterol today. She reports having liver problems in the past and did not tolerate cholesterol medication.

## 2012-07-07 NOTE — Assessment & Plan Note (Signed)
>>  ASSESSMENT AND PLAN FOR DYSLIPIDEMIA WRITTEN ON 07/07/2012  2:04 PM BY Antonieta Iba, MD  We will check her cholesterol today. She reports having liver problems in the past and did not tolerate cholesterol medication.

## 2012-07-08 LAB — LIPID PANEL
Chol/HDL Ratio: 5.1 ratio units — ABNORMAL HIGH (ref 0.0–4.4)
VLDL Cholesterol Cal: 35 mg/dL (ref 5–40)

## 2012-07-08 LAB — HEPATIC FUNCTION PANEL
Albumin: 4.1 g/dL (ref 3.5–5.5)
Alkaline Phosphatase: 85 IU/L (ref 42–107)
Total Protein: 6.7 g/dL (ref 6.0–8.5)

## 2012-07-14 DIAGNOSIS — R002 Palpitations: Secondary | ICD-10-CM

## 2012-07-24 ENCOUNTER — Telehealth: Payer: Self-pay | Admitting: Cardiovascular Disease

## 2012-07-24 NOTE — Telephone Encounter (Signed)
Informed pt of lab results. Pt mentioned that she can not tolerate lipitor and would like to know if there is an alternate drug that she could take? Please advise.

## 2012-07-24 NOTE — Telephone Encounter (Signed)
Pt called wanting to know if Dr Mariah Milling had decided about medication for her cholesterol.

## 2012-07-27 NOTE — Telephone Encounter (Signed)
Could try simvastatin 20 mg daily 

## 2012-07-28 ENCOUNTER — Telehealth: Payer: Self-pay | Admitting: *Deleted

## 2012-07-28 MED ORDER — ROSUVASTATIN CALCIUM 10 MG PO TABS: 10.0000 mg | ORAL_TABLET | Freq: Every day | ORAL | Status: DC

## 2012-07-28 NOTE — Telephone Encounter (Signed)
Spoke with pt and she mentioned that she can not tolerate Simvastatin or Atorvastatin. She mentioned that she was having cramping in her fingers and they make her feel terrible and give her problems with her liver. She mentioned that she has taken Crestor before and it makes her feel better but does not have a Rx to take Crestor and wanted to see if Dr. Mariah Milling would recommend her to take because she refuses to take simvastatin or atorvastatin because they make her feel really bad. Please Advise.

## 2012-07-28 NOTE — Telephone Encounter (Signed)
Informed pt that she can take crestor 10 mg daily. Sent in refill for crestor.

## 2012-07-28 NOTE — Telephone Encounter (Signed)
Please inform pt. Thanks

## 2012-07-28 NOTE — Telephone Encounter (Signed)
Ok to try crestor 10 mg daily She can try samples first if she would like

## 2012-07-30 ENCOUNTER — Encounter: Payer: Self-pay | Admitting: Cardiovascular Disease

## 2012-07-30 DIAGNOSIS — R55 Syncope and collapse: Secondary | ICD-10-CM

## 2012-08-07 ENCOUNTER — Emergency Department: Payer: Self-pay | Admitting: Emergency Medicine

## 2012-08-07 LAB — COMPREHENSIVE METABOLIC PANEL
Anion Gap: 9 (ref 7–16)
BUN: 16 mg/dL (ref 7–18)
Bilirubin,Total: 0.2 mg/dL (ref 0.2–1.0)
Chloride: 105 mmol/L (ref 98–107)
Creatinine: 0.8 mg/dL (ref 0.60–1.30)
EGFR (African American): >60
Glucose: 96 mg/dL (ref 65–99)
Osmolality: 280 (ref 275–301)
Potassium: 3.6 mmol/L (ref 3.5–5.1)
SGOT(AST): 14 U/L — ABNORMAL LOW (ref 15–37)
Sodium: 140 mmol/L (ref 136–145)
Total Protein: 8 g/dL (ref 6.4–8.2)

## 2012-08-07 LAB — CBC
HCT: 40.8 % (ref 35.0–47.0)
MCH: 28.9 pg (ref 26.0–34.0)
MCHC: 33.8 g/dL (ref 32.0–36.0)
MCV: 85 fL (ref 80–100)
Platelet: 245 10*3/uL (ref 150–440)
RBC: 4.78 10*6/uL (ref 3.80–5.20)
RDW: 13.5 % (ref 11.5–14.5)

## 2012-08-07 LAB — URINALYSIS, COMPLETE
Bacteria: NONE SEEN
Bilirubin,UR: NEGATIVE
Ketone: NEGATIVE
Leukocyte Esterase: NEGATIVE
Nitrite: NEGATIVE
RBC,UR: 1 /HPF (ref 0–5)
WBC UR: 1 /HPF (ref 0–5)

## 2012-08-07 LAB — LIPASE, BLOOD: Lipase: 175 U/L (ref 73–393)

## 2012-08-25 ENCOUNTER — Ambulatory Visit: Payer: Federal, State, Local not specified - PPO | Admitting: Internal Medicine

## 2012-10-01 ENCOUNTER — Ambulatory Visit (INDEPENDENT_AMBULATORY_CARE_PROVIDER_SITE_OTHER): Payer: Federal, State, Local not specified - PPO | Admitting: Internal Medicine

## 2012-10-01 ENCOUNTER — Encounter: Payer: Self-pay | Admitting: Internal Medicine

## 2012-10-01 VITALS — BP 128/84 | HR 74 | Ht 64.0 in | Wt 163.5 lb

## 2012-10-01 DIAGNOSIS — G909 Disorder of the autonomic nervous system, unspecified: Secondary | ICD-10-CM | POA: Insufficient documentation

## 2012-10-01 DIAGNOSIS — I499 Cardiac arrhythmia, unspecified: Secondary | ICD-10-CM

## 2012-10-01 DIAGNOSIS — R079 Chest pain, unspecified: Secondary | ICD-10-CM

## 2012-10-01 DIAGNOSIS — G901 Familial dysautonomia [Riley-Day]: Secondary | ICD-10-CM

## 2012-10-01 NOTE — Assessment & Plan Note (Signed)
As noted I am not sure of the explanation of her chest pain. I don't think is cardiac. It may be related as a trigger or as a consequence of her dysautonomia.

## 2012-10-01 NOTE — Patient Instructions (Addendum)
Your physician wants you to follow-up in: 6-8 weeks with Dr. Graciela Husbands. You will receive a reminder letter in the mail two months in advance. If you don't receive a letter, please call our office to schedule the follow-up appointment.

## 2012-10-01 NOTE — Assessment & Plan Note (Signed)
I suspect that dysautonomia is underlying explanation for many of her symptoms. She has orthostatic intolerance and environmental intolerance in many situations that would be predicted by dysautonomia. She also had significant amelioration of symptoms during pregnancy also it would be predicted. The chest pain part of this likely source as a trigger in the explanation for it I don't know.  Her lightheadedness and residual fatigue are also consistent with this diagnosis. Apparently after we were finished talking she tells stories in [place] passing out most of her life in stressful situations and having been put in support stockings to help.  With an interpreter we spent more than an hour taking the history and try to explain the physiology. I have given her the information for website NDRF.org as well as instructions to [employer] as she is salt averse  is also instructed to keep herself well-hydrated.  Furthermore issues of stress need to be addressed as this can be an aggravating factor for people with dysautonomic symptoms.   I've also told her that no hole was found her heart by echo and she does not need to worry about that any longer.

## 2012-10-01 NOTE — Assessment & Plan Note (Signed)
There is currently no evidence for primary arrhythmia disorder  ; however, she notes that on her event recorder she did not have any events while wearing it. These episodes as noted above to be quite episodic.
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
## 2012-10-15 ENCOUNTER — Telehealth: Payer: Self-pay | Admitting: Cardiovascular Disease

## 2012-10-15 NOTE — Telephone Encounter (Signed)
Dtr says pt had an episode of palpitations and chest pain last night. She gave pt some NTG SL. This am pt is diaphoretic and states, "her left arm is hurting her really bad". Dtr wants pt seen by [place] today I explained Dr. Mariah Milling not in office until 1015 today and if she is having active arm pain and diaphoretic, she should go to ER.  Dtr says they have done this before and "ER has done nothing". I had her hold while I paged Dr. Mariah Milling.  Dr. Mariah Milling was unable to call me back (is in hospital this am) so i advised dtr again to have pt go to ER if pt is having active pain and diaphoresis. Understanding verb

## 2012-10-15 NOTE — Telephone Encounter (Signed)
Pt daughter calling states that pt had episode last night of severe palps. Took nitro this am now has arm numbness with sweating.

## 2012-11-16 ENCOUNTER — Ambulatory Visit: Payer: Federal, State, Local not specified - PPO | Admitting: Internal Medicine

## 2012-12-01 ENCOUNTER — Ambulatory Visit: Payer: Federal, State, Local not specified - PPO | Admitting: Internal Medicine

## 2012-12-02 ENCOUNTER — Encounter: Payer: Self-pay | Admitting: *Deleted

## 2012-12-22 ENCOUNTER — Other Ambulatory Visit: Payer: Self-pay | Admitting: Cardiovascular Disease

## 2012-12-22 MED ORDER — DILTIAZEM HCL 30 MG PO TABS: 30.0000 mg | ORAL_TABLET | Freq: Every day | ORAL | Status: DC

## 2012-12-22 NOTE — Telephone Encounter (Signed)
Refill sent for crestor & diltiazem.

## 2013-01-25 ENCOUNTER — Encounter: Payer: Self-pay | Admitting: Cardiovascular Disease

## 2013-01-25 ENCOUNTER — Ambulatory Visit (INDEPENDENT_AMBULATORY_CARE_PROVIDER_SITE_OTHER): Payer: Federal, State, Local not specified - PPO | Admitting: Cardiovascular Disease

## 2013-01-25 VITALS — BP 104/64 | HR 62 | Ht 64.0 in | Wt 165.5 lb

## 2013-01-25 DIAGNOSIS — R000 Tachycardia, unspecified: Secondary | ICD-10-CM

## 2013-01-25 DIAGNOSIS — I499 Cardiac arrhythmia, unspecified: Secondary | ICD-10-CM

## 2013-01-25 DIAGNOSIS — R079 Chest pain, unspecified: Secondary | ICD-10-CM

## 2013-01-25 NOTE — Patient Instructions (Addendum)
You are doing well. No medication changes were made.  I will talk with Dr. Graciela Husbands about a loop monitor for your tachycardia  Please call us if you have new issues that need to be addressed before your next appt.  Your physician wants you to follow-up in: 6 months.  You will receive a reminder letter in the mail two months in advance. If you don't receive a letter, please call our office to schedule the follow-up appointment.

## 2013-01-25 NOTE — Assessment & Plan Note (Signed)
Episodes of chest pain. Unable to exclude spasm or other etiology. Would continue nitroglycerin for now when necessary

## 2013-01-25 NOTE — Assessment & Plan Note (Signed)
Several recent episodes of tachycardia. She is tearful at times talking about her symptoms. Prior event monitors did not capture her events. Episodes have been ongoing in a cyclical fashion per the patient every 2-3 months for years. Frequent trips to the emergency room and our office for similar symptoms. She and her husband are requesting a implantable loop monitor to help identify her rhythm and to help with management.blood pressure is low, we'll continue propranolol and diltiazem for now. We'll discuss with Dr. Graciela Husbands as she may be a candidate for a loop monitor.

## 2013-01-25 NOTE — Progress Notes (Signed)
Patient ID: Lori [name], female    DOB: 1966-02-15, 47 y.o.   MRN: [id number]  HPI Comments: Ms. Lori [name] is a pleasant 47 year old woman who is predominantly Spanish-speaking who presents for follow up evaluation of tachycardia,  shortness of breath with chest pain. She presents today with her husband, daughter and interpreter. She has had numerous visits in the past for tachycardia, chest pain.  She was previously seen by Dr. Graciela Husbands with long evaluation and time spent discussing her symptoms. She reports that she has had several severe episodes of tachycardia over the past week, one last Monday and one last Thursday. Both episodes she was at rest or in bed and she had a hot sensation, sharp pain in the left side of her chest radiating to the shoulder, also reported having up to 15 minutes of tachycardia. She did take diltiazem. Following the episodes, she felt retired and had to sleep. Both episodes were relatively similar. She reports that they seem to come every 2-3 months am a interwave. She did take nitroglycerin for her chest pain. She does have propranolol but did not take this. She denies any recent stressors.  Prior event monitors has not shown any significant arrhythmia or she has not had any episode of tachycardia probably event monitor is in place  prior echocardiogram was done here including bubble study that showed no PFO, essentially normal echocardiogram. Her husband reports that in the past, she was found by him in bed "unconscious". Details are uncertain but she reports having tachycardia and then "passed out" while in bed.   Previous attempts at identifying her arrhythmia was unsuccessful as she only wore the event monitor for 2 weeks secondary to skin irritation or other issues. They are both requesting repeat event monitor. She continues to have rare episodes of chest pain and takes nitroglycerin when necessary. Symptoms typically with rest.  On previous evaluation, She describes  the chest pain as a cramp in the left side of her chest that was deep. She does not feel that it was muscle. She has had several trips to the emergency room for left-sided chest pain, tachycardia. No evidence of an found in the emergency room on evaluation.   On previous visits, she reported she has been anxious at nighttime. She is "afraid of the dark". That is when most of her palpitations seem to happen.     EKG today shows normal sinus rhythm with rate 62 beats per minute,  no significant ST or T wave changes     Outpatient Encounter Prescriptions as of 01/25/2013  Medication Sig Dispense Refill  . albuterol (PROVENTIL HFA;VENTOLIN HFA) 108 (90 BASE) MCG/ACT inhaler Inhale 2 puffs into the lungs every 6 (six) hours as needed. For shortness of breath      . amoxicillin-clavulanate (AUGMENTIN) 875-125 MG per tablet Take 1 tablet by mouth 2 (two) times daily.       . CRESTOR 10 MG tablet TAKE 1 TABLET (10 MG TOTAL) BY MOUTH DAILY.  30 tablet  3  . diltiazem (CARDIZEM) 30 MG tablet TAKE 1-2 TABLETS BY MOUTH ONCE DAILY AS NEEDED FOR TACHYCARDIA  60 tablet  5  . isosorbide mononitrate (IMDUR) 30 MG 24 hr tablet Take 1 tablet (30 mg total) by mouth daily as needed.  30 tablet  3  . nitroGLYCERIN (NITROSTAT) 0.4 MG SL tablet Place 1 tablet (0.4 mg total) under the tongue every 5 (five) minutes as needed for chest pain.  25 tablet  6   No  facility-administered encounter medications on file as of 01/25/2013.     Review of Systems  Constitutional: Negative.   HENT: Negative.   Eyes: Negative.   Respiratory: Negative.   Cardiovascular: Positive for chest pain and palpitations.       Tachycardia  Gastrointestinal: Negative.   Musculoskeletal: Negative.   Skin: Negative.   Psychiatric/Behavioral: The patient is nervous/anxious.   All other systems reviewed and are negative.    BP 104/64  Pulse 62  Ht 5\' 4"  (1.626 m)  Wt 165 lb 8 oz (75.07 kg)  BMI 28.39 kg/m2  Physical Exam  Nursing  note and vitals reviewed. Constitutional: She is oriented to person, place, and time. She appears well-developed and well-nourished.  HENT:  Head: Normocephalic.  Nose: Nose normal.  Mouth/Throat: Oropharynx is clear and moist.  Eyes: Conjunctivae are normal. Pupils are equal, round, and reactive to light.  Neck: Normal range of motion. Neck supple. No JVD present.  Cardiovascular: Normal rate, regular rhythm, S1 normal, S2 normal, normal heart sounds and intact distal pulses.  Exam reveals no gallop and no friction rub.   No murmur heard. Pulmonary/Chest: Effort normal and breath sounds normal. No respiratory distress. She has no wheezes. She has no rales. She exhibits no tenderness.  Abdominal: Soft. Bowel sounds are normal. She exhibits no distension. There is no tenderness.  Musculoskeletal: Normal range of motion. She exhibits no edema and no tenderness.  Lymphadenopathy:    She has no cervical adenopathy.  Neurological: She is alert and oriented to person, place, and time. Coordination normal.  Skin: Skin is warm and dry. No rash noted. No erythema.  Psychiatric: She has a normal mood and affect. Her behavior is normal. Judgment and thought content normal.    Assessment and Plan

## 2013-02-12 ENCOUNTER — Emergency Department: Payer: Self-pay | Admitting: Emergency Medicine

## 2013-02-12 LAB — BASIC METABOLIC PANEL
Anion Gap: 6 — ABNORMAL LOW (ref 7–16)
BUN: 17 mg/dL (ref 7–18)
Calcium, Total: 8.4 mg/dL — ABNORMAL LOW (ref 8.5–10.1)
Chloride: 110 mmol/L — ABNORMAL HIGH (ref 98–107)
Creatinine: 0.63 mg/dL (ref 0.60–1.30)
EGFR (African American): >60
Potassium: 3.7 mmol/L (ref 3.5–5.1)

## 2013-02-12 LAB — CBC
HGB: 13.2 g/dL (ref 12.0–16.0)
MCH: 28.7 pg (ref 26.0–34.0)
MCHC: 33.8 g/dL (ref 32.0–36.0)
Platelet: 223 10*3/uL (ref 150–440)
WBC: 8.1 10*3/uL (ref 3.6–11.0)

## 2013-02-12 LAB — TROPONIN I: Troponin-I: <0.02 ng/mL

## 2013-02-18 ENCOUNTER — Telehealth: Payer: Self-pay

## 2013-02-18 NOTE — Telephone Encounter (Signed)
Message copied by [facility], Ygnacio Fecteau E on Thu Feb 18, 2013  8:02 AM ------      Message from: [facility], Rasa Degrazia E      Created: Tue Feb 09, 2013  8:12 AM      Regarding: klein       Ask klein about loop recorder for pt ------

## 2013-02-18 NOTE — Telephone Encounter (Signed)
Per Dr. Mariah Milling, pt may benefit from loop recorder. Should we have her make appt with you to discuss?

## 2013-03-02 NOTE — Telephone Encounter (Signed)
See below

## 2013-03-02 NOTE — Telephone Encounter (Signed)
If I remember correctly, she continues to have these tachycardia episodes that are hard to document. Not seen on 30 day monitor,  Very symptomatic on last visit. Tearful, husband very worried. This has been going on for years, rare, persistent Prn meds not doing the trick

## 2013-03-02 NOTE — Telephone Encounter (Signed)
i thinks her stuff is dysautonomia   We would be glad to see her the issue of a loop is mmost helpful in pts with recurring palps ( did she have while wearing her 30 day recorder??) or syncope

## 2013-03-03 NOTE — Telephone Encounter (Signed)
Per Dr. Thomasene Lot. Lori [name], please schedule appt with Dr. Graciela Husbands for discussion re: Loop thanks

## 2013-03-03 NOTE — Telephone Encounter (Signed)
appt with Dr Graciela Husbands 6/3 @ 2:45 pt daughter aware. Pt doenst speak english

## 2013-03-30 ENCOUNTER — Encounter: Payer: Self-pay | Admitting: Internal Medicine

## 2013-03-30 ENCOUNTER — Ambulatory Visit (INDEPENDENT_AMBULATORY_CARE_PROVIDER_SITE_OTHER): Payer: Federal, State, Local not specified - PPO | Admitting: Internal Medicine

## 2013-03-30 VITALS — BP 100/70 | HR 70 | Ht 65.0 in | Wt 165.5 lb

## 2013-03-30 DIAGNOSIS — G909 Disorder of the autonomic nervous system, unspecified: Secondary | ICD-10-CM

## 2013-03-30 DIAGNOSIS — G901 Familial dysautonomia [Riley-Day]: Secondary | ICD-10-CM

## 2013-03-30 DIAGNOSIS — I499 Cardiac arrhythmia, unspecified: Secondary | ICD-10-CM

## 2013-03-30 DIAGNOSIS — R000 Tachycardia, unspecified: Secondary | ICD-10-CM

## 2013-03-30 MED ORDER — THERMOTABS PO TABS
1.0000 | ORAL_TABLET | Freq: Two times a day (BID) | ORAL | Status: DC
Start: 2013-03-30 — End: 2013-06-08

## 2013-03-30 NOTE — Assessment & Plan Note (Signed)
i dont think that event recorder is useful here

## 2013-03-30 NOTE — Assessment & Plan Note (Signed)
As above.

## 2013-03-30 NOTE — Progress Notes (Signed)
Patient Care Team: Provider Default, MD as PCP - General (Orthopedic Surgery)   HPI  Lori [name] is a 47 y.o. female See again for palpitations in the context of what I thought an evaluation December 2013 was dysautonomia. She had accompanying lightheadedness residual fatigue and chest discomfort.prior evaluation had also suggested that she had a "hole in the heart". Recent echocardiogram did not support that diagnosis. Event recorder demonstrated only an infrequent PVC. At that time we reviewed the physiology of dysautonomia and the importance of salt walter repletion  She has reviewed NDRF website and this has been illuminating  To her   She doesn't like salt  So has avoided Past Medical History  Diagnosis Date  . Cancer, uterine 1998  . Chest pain     normal LV function by echo 10/2010  . Palpitations   . Idioventricular rhythm   . High cholesterol     Past Surgical History  Procedure Laterality Date  . Wrist surgery      broken wrist,left  . Cholecystectomy    . Cesarean section      x2  . Appendectomy    . Surgery on face      AFTER FALL  . Patent foramen ovale closure    . Neurocardiogenic syncope      Current Outpatient Prescriptions  Medication Sig Dispense Refill  . albuterol (PROVENTIL HFA;VENTOLIN HFA) 108 (90 BASE) MCG/ACT inhaler Inhale 2 puffs into the lungs every 6 (six) hours as needed. For shortness of breath      . aspirin 81 MG tablet Take 81 mg by mouth as needed for pain.      Marland Kitchen CRESTOR 10 MG tablet TAKE 1 TABLET (10 MG TOTAL) BY MOUTH DAILY.  30 tablet  3  . diltiazem (CARDIZEM) 30 MG tablet TAKE 1-2 TABLETS BY MOUTH ONCE DAILY AS NEEDED FOR TACHYCARDIA  60 tablet  5  . isosorbide mononitrate (IMDUR) 30 MG 24 hr tablet Take 1 tablet (30 mg total) by mouth daily as needed.  30 tablet  3  . nitroGLYCERIN (NITROSTAT) 0.4 MG SL tablet Place 1 tablet (0.4 mg total) under the tongue every 5 (five) minutes as needed for chest pain.  25 tablet  6  . NON  FORMULARY Liver clear powder daily.       No current facility-administered medications for this visit.    No Known Allergies  Review of Systems negative except from HPI and PMH  Physical Exam BP 100/70  Pulse 70  Ht 5\' 5"  (1.651 m)  Wt 165 lb 8 oz (75.07 kg)  BMI 27.54 kg/m2 Well developed and nourished in no acute distress HENT normal Neck supple with JVP-flat Clear Regular rate and rhythm, no murmurs or gallops Abd-soft with active BS No Clubbing cyanosis edema Skin-warm and dry A & Oriented  Grossly normal sensory and motor function   <ECG>  ECG: Sinus Rhythm 70            Intervals  13/08/38/   Axis  21    Assessment and  Plan

## 2013-03-30 NOTE — Assessment & Plan Note (Signed)
Spent >50 min reviewin ghte physiology and hte importance of sodium repletion  aslo talked about exercise and the diet

## 2013-03-30 NOTE — Patient Instructions (Addendum)
Try Thermotabs 1 tablet twice daily  Your physician wants you to follow-up in: 8 weeks with Dr. Graciela Husbands. You will receive a reminder letter in the mail two months in advance. If you don't receive a letter, please call our office to schedule the follow-up appointment.

## 2013-05-04 ENCOUNTER — Telehealth: Payer: Self-pay

## 2013-05-04 NOTE — Telephone Encounter (Signed)
Pt walked in office stating she had an "episode" of chest pain associated with palpitations this am She took SL NTG and 1 ASA which relieved the symptoms She denies further discomfort at this time but wants Dr. Windell Hummingbird opinion She says at last OV with Dr. Graciela Husbands he told her she should not need NTG but she says this is the only thing that helps her symptoms She wants Dr. Windell Hummingbird opinion re:NTG use I explained he is not in office until 1030 this am but I will send him a note and call her back Understanding verb

## 2013-05-06 NOTE — Telephone Encounter (Signed)
lmtcb

## 2013-05-06 NOTE — Telephone Encounter (Signed)
If she has coronary spasm, nitroglycerin may help her symptoms This is okay to use it will not hurt anything. Would sit down or lie down if she is taking nitroglycerin in case her blood pressure drops. If she is taking frequent nitroglycerin for chest pain symptoms that happen on a regular basis, Sometimes restart people on isosorbide daily

## 2013-05-13 NOTE — Telephone Encounter (Signed)
Called spoke with pt's daughter advised of Dr Windell Hummingbird recommendations.

## 2013-05-16 IMAGING — CR DG CHEST 2V
1 series · 1 of 1 positions shown · non-contrast
Comparison: None.

CLINICAL DATA: Chest pain, history of palpitations

CHEST - 2 VIEW

[w chest lat]
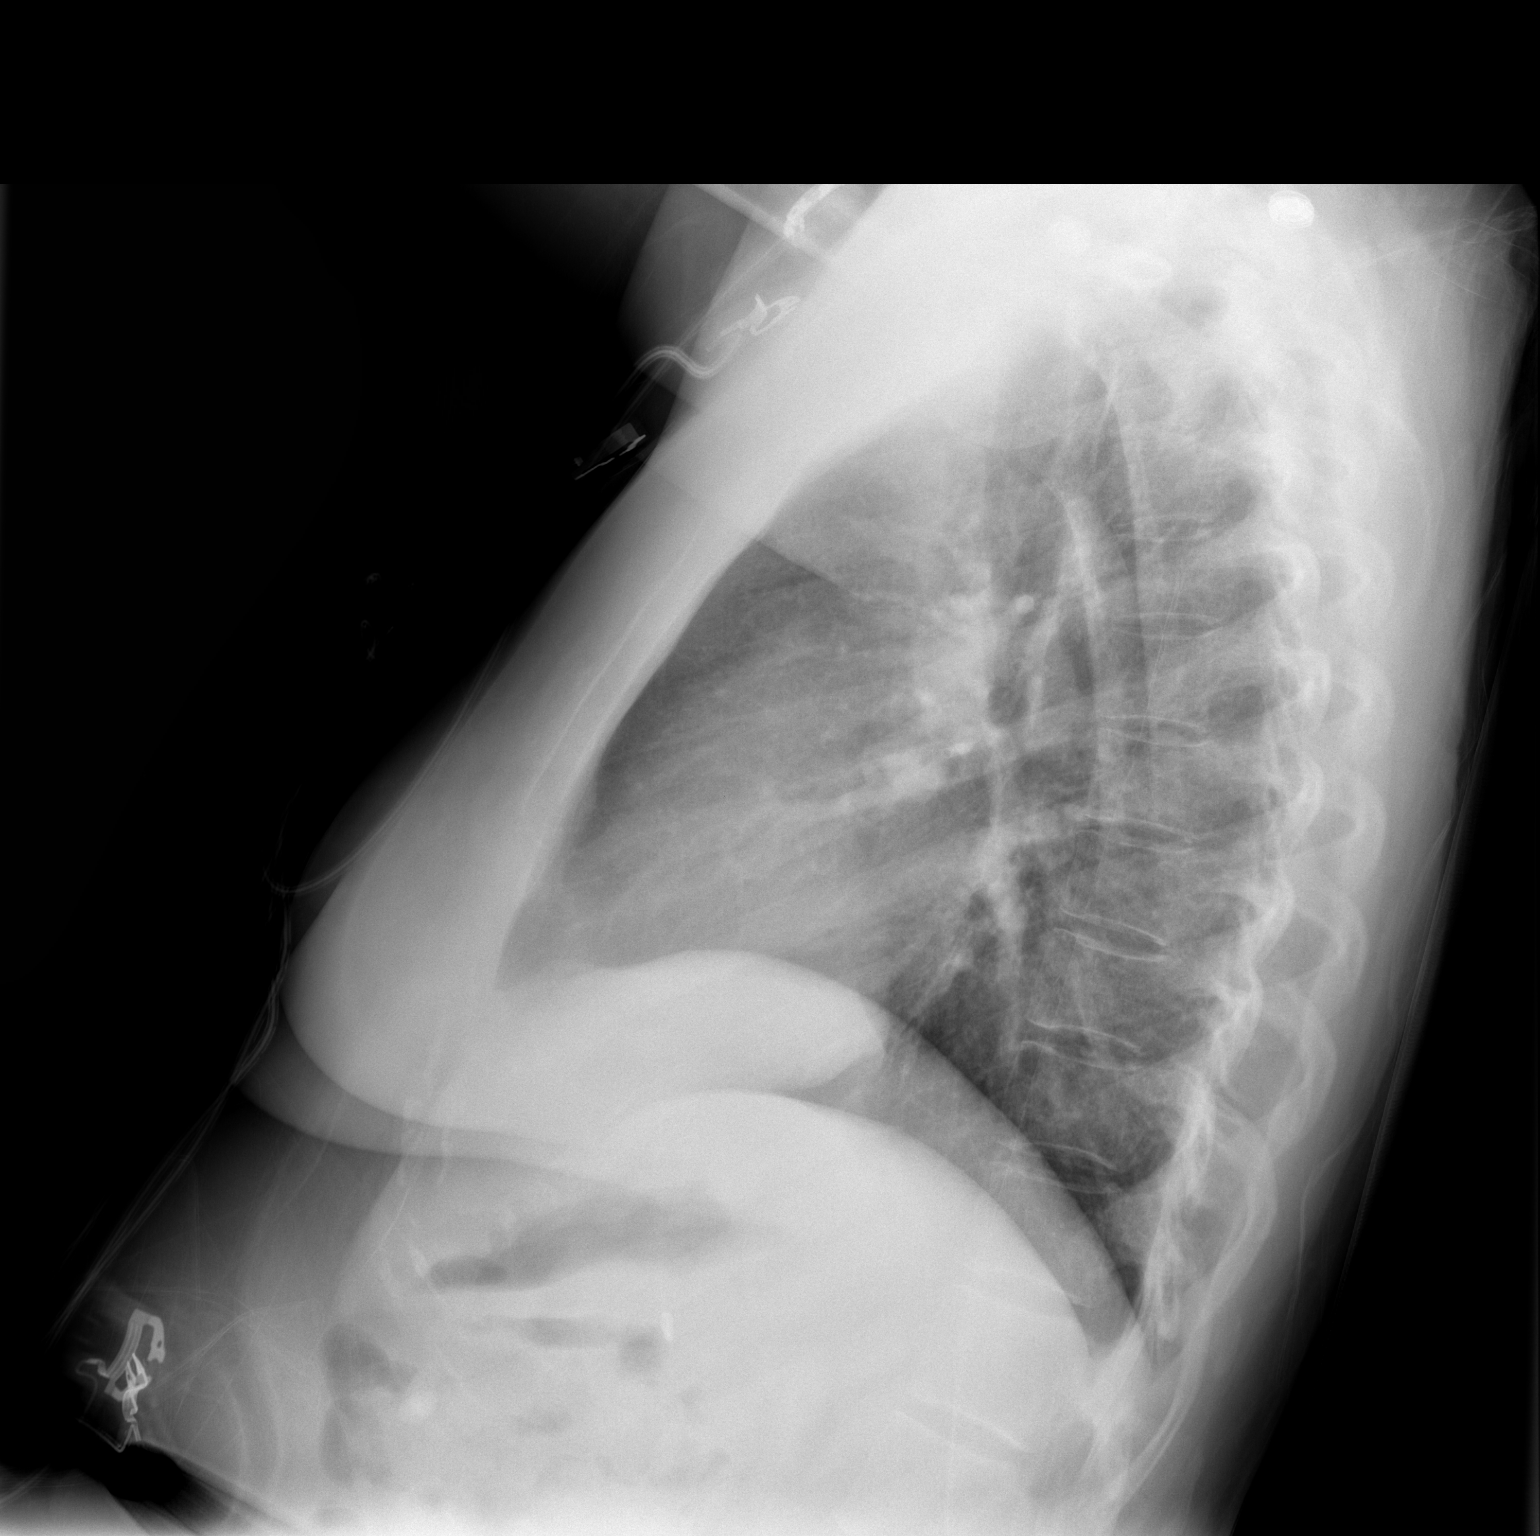

[1 of 1 positions shown; findings below may reference images not displayed]

FINDINGS: Cardiomediastinal silhouette is unremarkable.  No acute
infiltrate or pleural effusion.  No pulmonary edema.  Bony thorax
is unremarkable.
IMPRESSION: No active disease.

## 2013-05-23 ENCOUNTER — Emergency Department: Payer: Self-pay | Admitting: Emergency Medicine

## 2013-05-23 LAB — URINALYSIS, COMPLETE
Glucose,UR: NEGATIVE mg/dL (ref 0–75)
Nitrite: NEGATIVE
RBC,UR: 3 /HPF (ref 0–5)
Specific Gravity: 1.024 (ref 1.003–1.030)
Squamous Epithelial: 2
WBC UR: 1 /HPF (ref 0–5)

## 2013-05-23 LAB — COMPREHENSIVE METABOLIC PANEL
Alkaline Phosphatase: 99 U/L (ref 50–136)
Bilirubin,Total: 0.4 mg/dL (ref 0.2–1.0)
Calcium, Total: 9 mg/dL (ref 8.5–10.1)
Co2: 31 mmol/L (ref 21–32)
Creatinine: 0.87 mg/dL (ref 0.60–1.30)
EGFR (African American): >60
Potassium: 4 mmol/L (ref 3.5–5.1)
SGOT(AST): 23 U/L (ref 15–37)
SGPT (ALT): 31 U/L (ref 12–78)
Sodium: 140 mmol/L (ref 136–145)

## 2013-05-23 LAB — CBC
HCT: 39 % (ref 35.0–47.0)
HGB: 13.2 g/dL (ref 12.0–16.0)
MCHC: 33.8 g/dL (ref 32.0–36.0)
RBC: 4.65 10*6/uL (ref 3.80–5.20)
RDW: 13.6 % (ref 11.5–14.5)
WBC: 12.3 10*3/uL — ABNORMAL HIGH (ref 3.6–11.0)

## 2013-05-24 LAB — TROPONIN I: Troponin-I: <0.02 ng/mL

## 2013-05-24 LAB — CK TOTAL AND CKMB (NOT AT ARMC): CK-MB: <0.5 ng/mL — ABNORMAL LOW (ref 0.5–3.6)

## 2013-06-08 ENCOUNTER — Ambulatory Visit (INDEPENDENT_AMBULATORY_CARE_PROVIDER_SITE_OTHER): Payer: Federal, State, Local not specified - PPO | Admitting: Internal Medicine

## 2013-06-08 ENCOUNTER — Encounter: Payer: Self-pay | Admitting: Internal Medicine

## 2013-06-08 VITALS — BP 110/68 | HR 64 | Ht 64.0 in | Wt 163.5 lb

## 2013-06-08 DIAGNOSIS — G901 Familial dysautonomia [Riley-Day]: Secondary | ICD-10-CM

## 2013-06-08 DIAGNOSIS — R0602 Shortness of breath: Secondary | ICD-10-CM

## 2013-06-08 DIAGNOSIS — R002 Palpitations: Secondary | ICD-10-CM

## 2013-06-08 DIAGNOSIS — R079 Chest pain, unspecified: Secondary | ICD-10-CM

## 2013-06-08 DIAGNOSIS — G909 Disorder of the autonomic nervous system, unspecified: Secondary | ICD-10-CM

## 2013-06-08 NOTE — Assessment & Plan Note (Signed)
As above She has a history of asthma but she does not think that this is her asthma

## 2013-06-08 NOTE — Assessment & Plan Note (Addendum)
She continues to struggle with symptoms of dysautonomia. I think this is likely responsible for her dyspnea as well.  We have reviewed again the physiology and the importance of salt and fluid supplementation and I have given her the name of Therma tabs to get at [facility]  We spent more than 50% of her 25 minute visit counseling on the above

## 2013-06-08 NOTE — Patient Instructions (Addendum)
PLEASE FOLLOW UP WITH DR. Graciela Husbands 09/2013  NO CHANGES WERE MADE

## 2013-06-08 NOTE — Progress Notes (Signed)
Patient Care Team: Provider Default, MD as PCP - General (Orthopedic Surgery)   HPI  Lori [name] is a 47 y.o. female See again for palpitations in the context of what I thought on evaluation December 2013 was dysautonomia. She had accompanying lightheadedness residual fatigue and chest discomfort.prior evaluation had also suggested that she had a "hole in the heart". Recent echocardiogram did not support that diagnosis. Event recorder demonstrated only an infrequent PVC.  At that time we reviewed the physiology of dysautonomia and the importance of salt walter repletion  She has reviewed NDRF website and this has been illuminating; when I last saw her she was somewhat improved  She has been reversed to taking salt. It is her impression that has caused UTIs and concentrated urine; she also does not like the taste. She is trying to remain salt replete. The summer has been very difficult for her. The major problem has been dyspnea     Past Medical History  Diagnosis Date  . Cancer, uterine 1998  . Chest pain     normal LV function by echo 10/2010  . Palpitations   . Idioventricular rhythm   . High cholesterol     Past Surgical History  Procedure Laterality Date  . Wrist surgery      broken wrist,left  . Cholecystectomy    . Cesarean section      x2  . Appendectomy    . Surgery on face      AFTER FALL  . Patent foramen ovale closure    . Neurocardiogenic syncope      Current Outpatient Prescriptions  Medication Sig Dispense Refill  . albuterol (PROVENTIL HFA;VENTOLIN HFA) 108 (90 BASE) MCG/ACT inhaler Inhale 2 puffs into the lungs every 6 (six) hours as needed. For shortness of breath      . aspirin 81 MG tablet Take 81 mg by mouth as needed for pain.      Marland Kitchen CRESTOR 10 MG tablet TAKE 1 TABLET (10 MG TOTAL) BY MOUTH DAILY.  30 tablet  3  . diltiazem (CARDIZEM) 30 MG tablet TAKE 1-2 TABLETS BY MOUTH ONCE DAILY AS NEEDED FOR TACHYCARDIA  60 tablet  5  . isosorbide mononitrate  (IMDUR) 30 MG 24 hr tablet Take 1 tablet (30 mg total) by mouth daily as needed.  30 tablet  3  . metroNIDAZOLE (FLAGYL) 500 MG tablet Take 250 mg by mouth 2 (two) times daily.       . nitroGLYCERIN (NITROSTAT) 0.4 MG SL tablet Place 1 tablet (0.4 mg total) under the tongue every 5 (five) minutes as needed for chest pain.  25 tablet  6  . NON FORMULARY Liver clear powder daily.      Marland Kitchen oral electrolytes (THERMOTABS) TABS Take 1 tablet by mouth 2 (two) times daily.  60 tablet  3   No current facility-administered medications for this visit.    No Known Allergies  Review of Systems negative except from HPI and PMH  Physical Exam BP 110/68  Ht 5\' 4"  (1.626 m)  Wt 163 lb 8 oz (74.163 kg)  BMI 28.05 kg/m2  Well developed and nourished in no acute distress HENT normal Neck supple with JVP-flat Clear Regular rate and rhythm, no murmurs or gallops Abd-soft with active BS No Clubbing cyanosis edema Skin-warm and dry A & Oriented  Grossly normal sensory and motor function    ECG demonstrates sinus rhythm at 74 Intervals 14/08/37 Normal  Assessment and  Plan

## 2013-08-07 IMAGING — CT CT CHEST W/ CM
1 series · 1 of 1 positions shown · non-contrast
Comparison: none

REASON FOR EXAM: left chest pain and dyspnea
COMMENTS:

[Series 1: topogram 1.0 t20s · coronal · 1.0mm · 1.00mm/px · 1 of 1 slices shown]
[im 1/1]
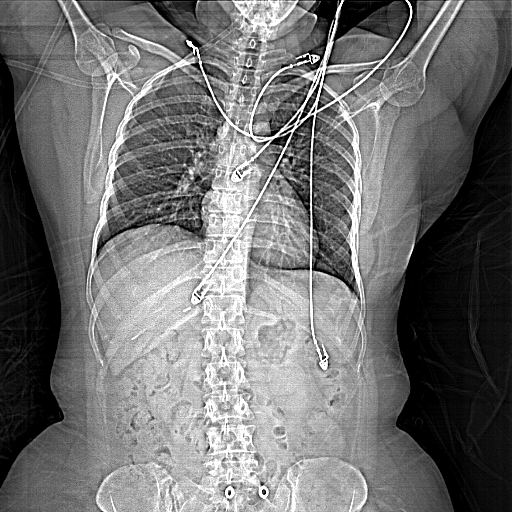

[1 of 1 positions shown; findings below may reference images not displayed]

PROCEDURE:     CT  - CT CHEST (FOR PE) W  - February 29, 2012 [DATE]

RESULT:     Axial CT scanning was performed through the chest with
reconstructions at 3 mm intervals and slice thicknesses. The patient
received 100 cc of [id number]. Review of multiplanar reconstructed images
was performed separately on the VIA monitor.

Contrast within the pulmonary arterial tree is normal in appearance. I see
no filling defects to suggest an acute pulmonary embolism. The cardiac
chambers are normal in size. The caliber of the thoracic aorta is normal. I
see no bulky mediastinal or hilar lymph nodes. A few borderline enlarged
nodes are present in the mediastinum and axillary regions. There is no
pleural or pericardial effusion.

At lung window settings I see no interstitial nor alveolar infiltrates. No
pulmonary parenchymal nodules are demonstrated.

Within the upper abdomen the observed portions of the liver and spleen and
pancreas exhibit no acute abnormality. The gallbladder is not included in
the field-of-view. I see no adrenal masses. The thoracic vertebral bodies
are preserved in height.
IMPRESSION: 1. I do not see evidence of an acute pulmonary embolism.
2. There is no evidence of CHF nor acute thoracic aortic pathology.
3. I see no evidence of pneumonia nor abnormal pulmonary nodules. There is
no pneumothorax nor pneumomediastinum.
4. There are numerous borderline enlarged mediastinal and hilar lymph nodes.

A preliminary report was sent to the [HOSPITAL] the conclusion
of the study.

[REDACTED]

## 2013-08-12 ENCOUNTER — Ambulatory Visit: Payer: Federal, State, Local not specified - PPO | Admitting: Cardiovascular Disease

## 2013-08-12 ENCOUNTER — Encounter: Payer: Self-pay | Admitting: *Deleted

## 2013-09-10 ENCOUNTER — Other Ambulatory Visit: Payer: Self-pay | Admitting: Cardiovascular Disease

## 2013-09-13 ENCOUNTER — Other Ambulatory Visit: Payer: Self-pay | Admitting: *Deleted

## 2013-09-13 MED ORDER — NITROGLYCERIN 0.4 MG SL SUBL: 0.4000 mg | SUBLINGUAL_TABLET | SUBLINGUAL | Status: DC | PRN

## 2013-09-13 NOTE — Telephone Encounter (Signed)
Requested Prescriptions   Signed Prescriptions Disp Refills  . nitroGLYCERIN (NITROSTAT) 0.4 MG SL tablet 25 tablet 6    Sig: Place 1 tablet (0.4 mg total) under the tongue every 5 (five) minutes as needed for chest pain.    Authorizing Provider: GOLLAN, TIMOTHY J    Ordering User: LOPEZ, MARINA C    

## 2013-10-05 ENCOUNTER — Encounter: Payer: Self-pay | Admitting: Internal Medicine

## 2013-10-05 ENCOUNTER — Ambulatory Visit (INDEPENDENT_AMBULATORY_CARE_PROVIDER_SITE_OTHER): Payer: Federal, State, Local not specified - PPO | Admitting: Internal Medicine

## 2013-10-05 VITALS — BP 107/75 | HR 64 | Ht 64.0 in | Wt 163.0 lb

## 2013-10-05 DIAGNOSIS — G901 Familial dysautonomia [Riley-Day]: Secondary | ICD-10-CM

## 2013-10-05 DIAGNOSIS — R000 Tachycardia, unspecified: Secondary | ICD-10-CM

## 2013-10-05 DIAGNOSIS — G909 Disorder of the autonomic nervous system, unspecified: Secondary | ICD-10-CM

## 2013-10-05 NOTE — Patient Instructions (Signed)
Your physician wants you to follow-up in: 6 months with Dr. Klein. You will receive a reminder letter in the mail two months in advance. If you don't receive a letter, please call our office to schedule the follow-up appointment.  Your physician recommends that you continue on your current medications as directed. Please refer to the Current Medication list given to you today.  

## 2013-10-05 NOTE — Assessment & Plan Note (Signed)
Orthostatic vital signs today are essentially normal. She is well compensated. We've reviewed the potential role blocks for the winter prior to returning of heat. Specifically, I mentioned that in the event that she becomes intercurrently ill at salt and water would be beneficial. Summer time comes salt supplementation would again be beneficial.

## 2013-10-05 NOTE — Progress Notes (Signed)
      Patient Care Team: Provider Default, MD as PCP - General (Orthopedic Surgery)   HPI  Lori [name] is a 47 y.o. female Seen in followup for palpitations in the context of what I thought on evaluation December 2013 was dysautonomia. She had accompanying lightheadedness residual fatigue and chest discomfort.prior evaluation had also suggested that she had a "hole in the heart". Recent echocardiogram did not support that diagnosis. Event recorder demonstrated only an infrequent PVC.  At that time we reviewed the physiology of dysautonomia and the importance of salt walter repletion  She has reviewed NDRF website and this has been illuminating; when I last saw her she was somewhat improved  She has been averse to taking salt.  Recommended thermatabs which she has not taken.  She is feeling great as winter time approaches.  Past Medical History  Diagnosis Date  . Cancer, uterine 1998  . Chest pain     normal LV function by echo 10/2010  . Palpitations   . Idioventricular rhythm   . High cholesterol     Past Surgical History  Procedure Laterality Date  . Wrist surgery      broken wrist,left  . Cholecystectomy    . Cesarean section      x2  . Appendectomy    . Surgery on face      AFTER FALL  . Patent foramen ovale closure    . Neurocardiogenic syncope      Current Outpatient Prescriptions  Medication Sig Dispense Refill  . aspirin 81 MG tablet Take 81 mg by mouth as needed for pain.      Marland Kitchen CRESTOR 10 MG tablet TAKE 1 TABLET (10 MG TOTAL) BY MOUTH DAILY.  30 tablet  3  . nitroGLYCERIN (NITROSTAT) 0.4 MG SL tablet Place 1 tablet (0.4 mg total) under the tongue every 5 (five) minutes as needed for chest pain.  25 tablet  6   No current facility-administered medications for this visit.    No Known Allergies  Review of Systems negative except from HPI and PMH  Physical Exam BP 107/75  Pulse 64  Ht 5\' 4"  (1.626 m)  Wt 163 lb (73.936 kg)  BMI 27.97 kg/m2 Well  developed and nourished in no acute distress HENT normal Neck supple with JVP-flat Clear Regular rate and rhythm, no murmurs or gallops Abd-soft with active BS No Clubbing cyanosis edema Skin-warm and dry A & Oriented  Grossly normal sensory and motor function ECG demonstrates sinus rhythm at 64 Intervals 13/07/39 axis is 35   Assessment and  Plan

## 2013-10-16 ENCOUNTER — Other Ambulatory Visit: Payer: Self-pay | Admitting: Cardiovascular Disease

## 2013-10-18 ENCOUNTER — Other Ambulatory Visit: Payer: Self-pay | Admitting: *Deleted

## 2013-10-18 MED ORDER — ROSUVASTATIN CALCIUM 10 MG PO TABS: ORAL_TABLET | ORAL | Status: DC

## 2013-10-18 NOTE — Telephone Encounter (Signed)
Requested Prescriptions   Signed Prescriptions Disp Refills  . rosuvastatin (CRESTOR) 10 MG tablet 30 tablet 1    Sig: TAKE 1 TABLET (10 MG TOTAL) BY MOUTH DAILY.    Authorizing Provider: Antonieta Iba    Ordering User: Kendrick Fries

## 2014-03-02 ENCOUNTER — Telehealth: Payer: Self-pay

## 2014-03-02 NOTE — Telephone Encounter (Signed)
Pt presented to waiting room with her daughter stating that she has felt bad for the past two days, has increasing sob and chest pain. Daughter states that pt took nitro before they left to come here. Advised daughter to take pt to ED or urgent care, as it is lunchtime and I don't have a doctor to see her. Offered to call 911 if she cannot get her there.  Daughter states that she can take pt and prefers to go to urgent care, as she does not want to wait in ED. She states that she will call to set up appt after she is evaluated.

## 2014-03-02 NOTE — Telephone Encounter (Signed)
Spoke w/ pt's family member, as pt does not speak [place].  Advised her that I had recommended that pt go to Urgent Care or ED when she presented to the office earlier today and I was calling to check on her.  She states that pt did not go to either place, as she is feeling better and "took the salts that make her feel good". Offered to sched appt to come in and see Dr. Rockey Situ, but she states that she will call back when it is convenient for her.

## 2014-03-03 ENCOUNTER — Emergency Department: Payer: Self-pay | Admitting: Emergency Medicine

## 2014-03-03 ENCOUNTER — Telehealth: Payer: Self-pay | Admitting: *Deleted

## 2014-03-03 LAB — CBC
HCT: 43.6 % (ref 35.0–47.0)
HGB: 14.8 g/dL (ref 12.0–16.0)
MCH: 29.4 pg (ref 26.0–34.0)
MCHC: 33.9 g/dL (ref 32.0–36.0)
MCV: 87 fL (ref 80–100)
Platelet: 244 10*3/uL (ref 150–440)
RBC: 5.04 10*6/uL (ref 3.80–5.20)
RDW: 13.6 % (ref 11.5–14.5)
WBC: 7.9 10*3/uL (ref 3.6–11.0)

## 2014-03-03 LAB — BASIC METABOLIC PANEL
ANION GAP: 6 — AB (ref 7–16)
BUN: 12 mg/dL (ref 7–18)
CO2: 27 mmol/L (ref 21–32)
Calcium, Total: 9.3 mg/dL (ref 8.5–10.1)
Chloride: 108 mmol/L — ABNORMAL HIGH (ref 98–107)
Creatinine: 0.6 mg/dL (ref 0.60–1.30)
EGFR (Non-African Amer.): >60
GLUCOSE: 102 mg/dL — AB (ref 65–99)
Osmolality: 281 (ref 275–301)
Potassium: 4 mmol/L (ref 3.5–5.1)
Sodium: 141 mmol/L (ref 136–145)

## 2014-03-03 LAB — TROPONIN I: Troponin-I: <0.02 ng/mL

## 2014-03-03 NOTE — Telephone Encounter (Signed)
Spoke with pt and she mentioned that she went to ED due to arrythmia, chest pain that radiates to back, shortness of breath, sweating of hands/feet and nausea. Pt mentioned that she left ED after being there for 3 hours and not being seen or helped. Pt mentioned that she felt so terrible that she couldn't wait and wanted to just go home and lay down. No heart rate BP:120/60. Pt is aware that we have schedule appointment with [name] 03/08/2014 @9 :30. She is aware to go to the hospital if symptoms worsen.

## 2014-03-08 ENCOUNTER — Ambulatory Visit (INDEPENDENT_AMBULATORY_CARE_PROVIDER_SITE_OTHER): Payer: Federal, State, Local not specified - PPO | Admitting: Internal Medicine

## 2014-03-08 ENCOUNTER — Encounter: Payer: Self-pay | Admitting: Internal Medicine

## 2014-03-08 VITALS — BP 90/64 | HR 69 | Ht 64.0 in | Wt 161.8 lb

## 2014-03-08 DIAGNOSIS — I499 Cardiac arrhythmia, unspecified: Secondary | ICD-10-CM

## 2014-03-08 DIAGNOSIS — R079 Chest pain, unspecified: Secondary | ICD-10-CM

## 2014-03-08 DIAGNOSIS — E871 Hypo-osmolality and hyponatremia: Secondary | ICD-10-CM

## 2014-03-08 NOTE — Progress Notes (Signed)
      Patient Care Team: Provider Default, MD as PCP - General (Orthopedic Surgery)   HPI  Lori [name] is a 48 y.o. female Seen in followup for palpitations in the context of what I thought on evaluation December 2013 was dysautonomia. She had accompanying lightheadedness residual fatigue and chest discomfort.prior evaluation had also suggested that she had a "hole in the heart". Recent echocardiogram did not support that diagnosis. Event recorder demonstrated only an infrequent PVC.   When we last saw her on dysautonomic therapy she had been doing better, albeit in late fall   She has been doing considerably worse since thetemperature got warmer a few weeks ago  She is well aware of the physioogy and impact of ambient heat   Past Medical History  Diagnosis Date  . Cancer, uterine 1998  . Chest pain     normal LV function by echo 10/2010  . Palpitations   . Idioventricular rhythm   . High cholesterol     Past Surgical History  Procedure Laterality Date  . Wrist surgery      broken wrist,left  . Cholecystectomy    . Cesarean section      x2  . Appendectomy    . Surgery on face      AFTER FALL  . Patent foramen ovale closure    . Neurocardiogenic syncope      Current Outpatient Prescriptions  Medication Sig Dispense Refill  . aspirin 81 MG tablet Take 81 mg by mouth as needed for pain.      . nitroGLYCERIN (NITROSTAT) 0.4 MG SL tablet Place 1 tablet (0.4 mg total) under the tongue every 5 (five) minutes as needed for chest pain.  25 tablet  6  . rosuvastatin (CRESTOR) 10 MG tablet TAKE 1 TABLET (10 MG TOTAL) EVERY OTHER DAY.      Marland Kitchen ibuprofen (ADVIL,MOTRIN) 800 MG tablet        No current facility-administered medications for this visit.    No Known Allergies  Review of Systems negative except from HPI and PMH  Physical Exam BP 90/64  Pulse 69  Ht 5\' 4"  (1.626 m)  Wt 161 lb 12 oz (73.369 kg)  BMI 27.75 kg/m2 Well developed and well nourished in no acute  distress HENT normal E scleral and icterus clear Neck Supple JVP flat; carotids brisk and full Clear to ausculation  *Regular rate and rhythm, no murmurs gallops or rub Soft with active bowel sounds No clubbing cyanosis  Edema Alert and oriented, grossly normal motor and sensory function Skin Warm and Dry    Assessment and  Plan  Dysautonomia  Hypotension  She is doing a good job with fluids, and possibly sodium   We will measure 24 hr sodium excretion to assess adequacy of repletion She may benefit from florinef  We reviewed issues of heat again and strategies for dealing with it > 20 min

## 2014-03-08 NOTE — Patient Instructions (Signed)
Your physician recommends that you schedule a follow-up appointment in:  6 weeks   Please take order to [facility]. Go to the registration desk and they will guide you to the lab for your urine container.

## 2014-03-17 ENCOUNTER — Encounter: Payer: Self-pay | Admitting: Cardiovascular Disease

## 2014-04-06 ENCOUNTER — Ambulatory Visit: Payer: Federal, State, Local not specified - PPO | Admitting: Internal Medicine

## 2014-04-26 ENCOUNTER — Encounter: Payer: Self-pay | Admitting: Internal Medicine

## 2014-04-26 ENCOUNTER — Ambulatory Visit (INDEPENDENT_AMBULATORY_CARE_PROVIDER_SITE_OTHER): Payer: Federal, State, Local not specified - PPO | Admitting: Internal Medicine

## 2014-04-26 VITALS — BP 122/72 | HR 62 | Ht 64.0 in | Wt 163.5 lb

## 2014-04-26 DIAGNOSIS — R000 Tachycardia, unspecified: Secondary | ICD-10-CM

## 2014-04-26 NOTE — Progress Notes (Signed)
      Patient Care Team: Provider Default, MD as PCP - General (Orthopedic Surgery)   HPI  Lori [name] is a 48 y.o. female Seen in followup for palpitations in the context of what I thought on evaluation December 2013 was dysautonomia. She had accompanying lightheadedness residual fatigue and chest discomfort.prior evaluation had also suggested that she had a "hole in the heart". Recent echocardiogram did not support that diagnosis. Event recorder demonstrated only an infrequent PVC.   When we last saw her on dysautonomic therapy she had been doing better, albeit in late fall     Since last having been seen she has suffered again with a kidney stone. She had added olives to  her diet. She is taking fluids copiously. She is feeling well. She is going to the we'll. She's had minimal lightheadedness.  Past Medical History  Diagnosis Date  . Cancer, uterine 1998  . Chest pain     normal LV function by echo 10/2010  . Palpitations   . Idioventricular rhythm   . High cholesterol   . Kidney stone     Past Surgical History  Procedure Laterality Date  . Wrist surgery      broken wrist,left  . Cholecystectomy    . Cesarean section      x2  . Appendectomy    . Surgery on face      AFTER FALL  . Patent foramen ovale closure    . Neurocardiogenic syncope      Current Outpatient Prescriptions  Medication Sig Dispense Refill  . aspirin 81 MG tablet Take 81 mg by mouth as needed for pain.      Marland Kitchen ibuprofen (ADVIL,MOTRIN) 800 MG tablet       . nitroGLYCERIN (NITROSTAT) 0.4 MG SL tablet Place 1 tablet (0.4 mg total) under the tongue every 5 (five) minutes as needed for chest pain.  25 tablet  6  . rosuvastatin (CRESTOR) 10 MG tablet TAKE 1 TABLET (10 MG TOTAL) EVERY OTHER DAY.      . traMADol (ULTRAM) 50 MG tablet Take 50 mg by mouth every 6 (six) hours as needed.       No current facility-administered medications for this visit.    Allergies  Allergen Reactions  . Sulfamethizole  Hives    Review of Systems negative except from HPI and PMH  Physical Exam BP 122/72  Pulse 62  Ht 5\' 4"  (1.626 m)  Wt 163 lb 8 oz (74.163 kg)  BMI 28.05 kg/m2 Well developed and well nourished in no acute distress HENT normal E scleral and icterus clear Neck Supple JVP flat; carotids brisk and full Clear to ausculation  *Regular rate and rhythm, no murmurs gallops or rub Soft with active bowel sounds No clubbing cyanosis  Edema Alert and oriented, grossly normal motor and sensory function Skin Warm and Dry    Assessment and  Plan  Dysautonomia  Hypotension  She is doing a good job with fluids, and possibly sodium    We'll continue her on her current plan of fluids and olives.

## 2014-04-26 NOTE — Patient Instructions (Signed)
Your physician recommends that you continue on your current medications as directed. Please refer to the Current Medication list given to you today.  Your physician wants you to follow-up in: 6 months. You will receive a reminder letter in the mail two months in advance. If you don't receive a letter, please call our office to schedule the follow-up appointment.  

## 2014-05-24 ENCOUNTER — Ambulatory Visit: Payer: Self-pay | Admitting: Nurse Practitioner

## 2014-05-25 DIAGNOSIS — F0781 Postconcussional syndrome: Secondary | ICD-10-CM | POA: Insufficient documentation

## 2014-07-19 ENCOUNTER — Emergency Department: Payer: Self-pay | Admitting: Emergency Medicine

## 2014-07-19 DIAGNOSIS — M5412 Radiculopathy, cervical region: Secondary | ICD-10-CM | POA: Insufficient documentation

## 2014-07-19 LAB — CBC
HCT: 44.3 % (ref 35.0–47.0)
HGB: 14.1 g/dL (ref 12.0–16.0)
MCH: 27.7 pg (ref 26.0–34.0)
MCHC: 31.8 g/dL — ABNORMAL LOW (ref 32.0–36.0)
MCV: 87 fL (ref 80–100)
PLATELETS: 252 10*3/uL (ref 150–440)
RBC: 5.08 10*6/uL (ref 3.80–5.20)
RDW: 13.1 % (ref 11.5–14.5)
WBC: 9 10*3/uL (ref 3.6–11.0)

## 2014-07-19 LAB — URINALYSIS, COMPLETE
Bacteria: NONE SEEN
Bilirubin,UR: NEGATIVE
Glucose,UR: NEGATIVE mg/dL (ref 0–75)
Ketone: NEGATIVE
Leukocyte Esterase: NEGATIVE
Nitrite: NEGATIVE
Ph: 6 (ref 4.5–8.0)
Protein: NEGATIVE
RBC,UR: 1 /HPF (ref 0–5)
Specific Gravity: 1.015 (ref 1.003–1.030)
Squamous Epithelial: 1
WBC UR: <1 /HPF (ref 0–5)

## 2014-07-19 LAB — COMPREHENSIVE METABOLIC PANEL
Albumin: 3.7 g/dL (ref 3.4–5.0)
Alkaline Phosphatase: 81 U/L
Anion Gap: 4 — ABNORMAL LOW (ref 7–16)
BUN: 12 mg/dL (ref 7–18)
Bilirubin,Total: 0.4 mg/dL (ref 0.2–1.0)
CALCIUM: 8.3 mg/dL — AB (ref 8.5–10.1)
Chloride: 107 mmol/L (ref 98–107)
Co2: 30 mmol/L (ref 21–32)
Creatinine: 0.73 mg/dL (ref 0.60–1.30)
EGFR (Non-African Amer.): >60
Glucose: 96 mg/dL (ref 65–99)
OSMOLALITY: 281 (ref 275–301)
POTASSIUM: 3.9 mmol/L (ref 3.5–5.1)
SGOT(AST): 24 U/L (ref 15–37)
SGPT (ALT): 25 U/L
SODIUM: 141 mmol/L (ref 136–145)
Total Protein: 7.8 g/dL (ref 6.4–8.2)

## 2014-08-09 ENCOUNTER — Encounter: Payer: Self-pay | Admitting: Cardiovascular Disease

## 2014-08-18 ENCOUNTER — Telehealth: Payer: Self-pay

## 2014-08-18 NOTE — Telephone Encounter (Signed)
Pt states her neurologist needs to do surgery and needs to speak with [name] regarding her heart condition before surgery. Pt States she if very fearful. Please call.

## 2014-08-19 NOTE — Telephone Encounter (Signed)
Patient stated she will call back with neurologists name and type of procedure 10/23

## 2014-11-01 IMAGING — CR DG CHEST 2V
1 series · 2 of 2 positions shown · non-contrast
Comparison: none

REASON FOR EXAM: chest pain  -  ed rm 4
COMMENTS:

PROCEDURE:     DXR - DXR CHEST PA (OR AP) AND LATERAL  - May 24, 2013  [DATE]
RESULT:     The lungs are clear. The cardiac silhouette and visualized bony
skeleton are unremarkable.

[Series 1: w chest pa · 0.14mm/px · 2 of 2 slices shown]
[im 1/2]
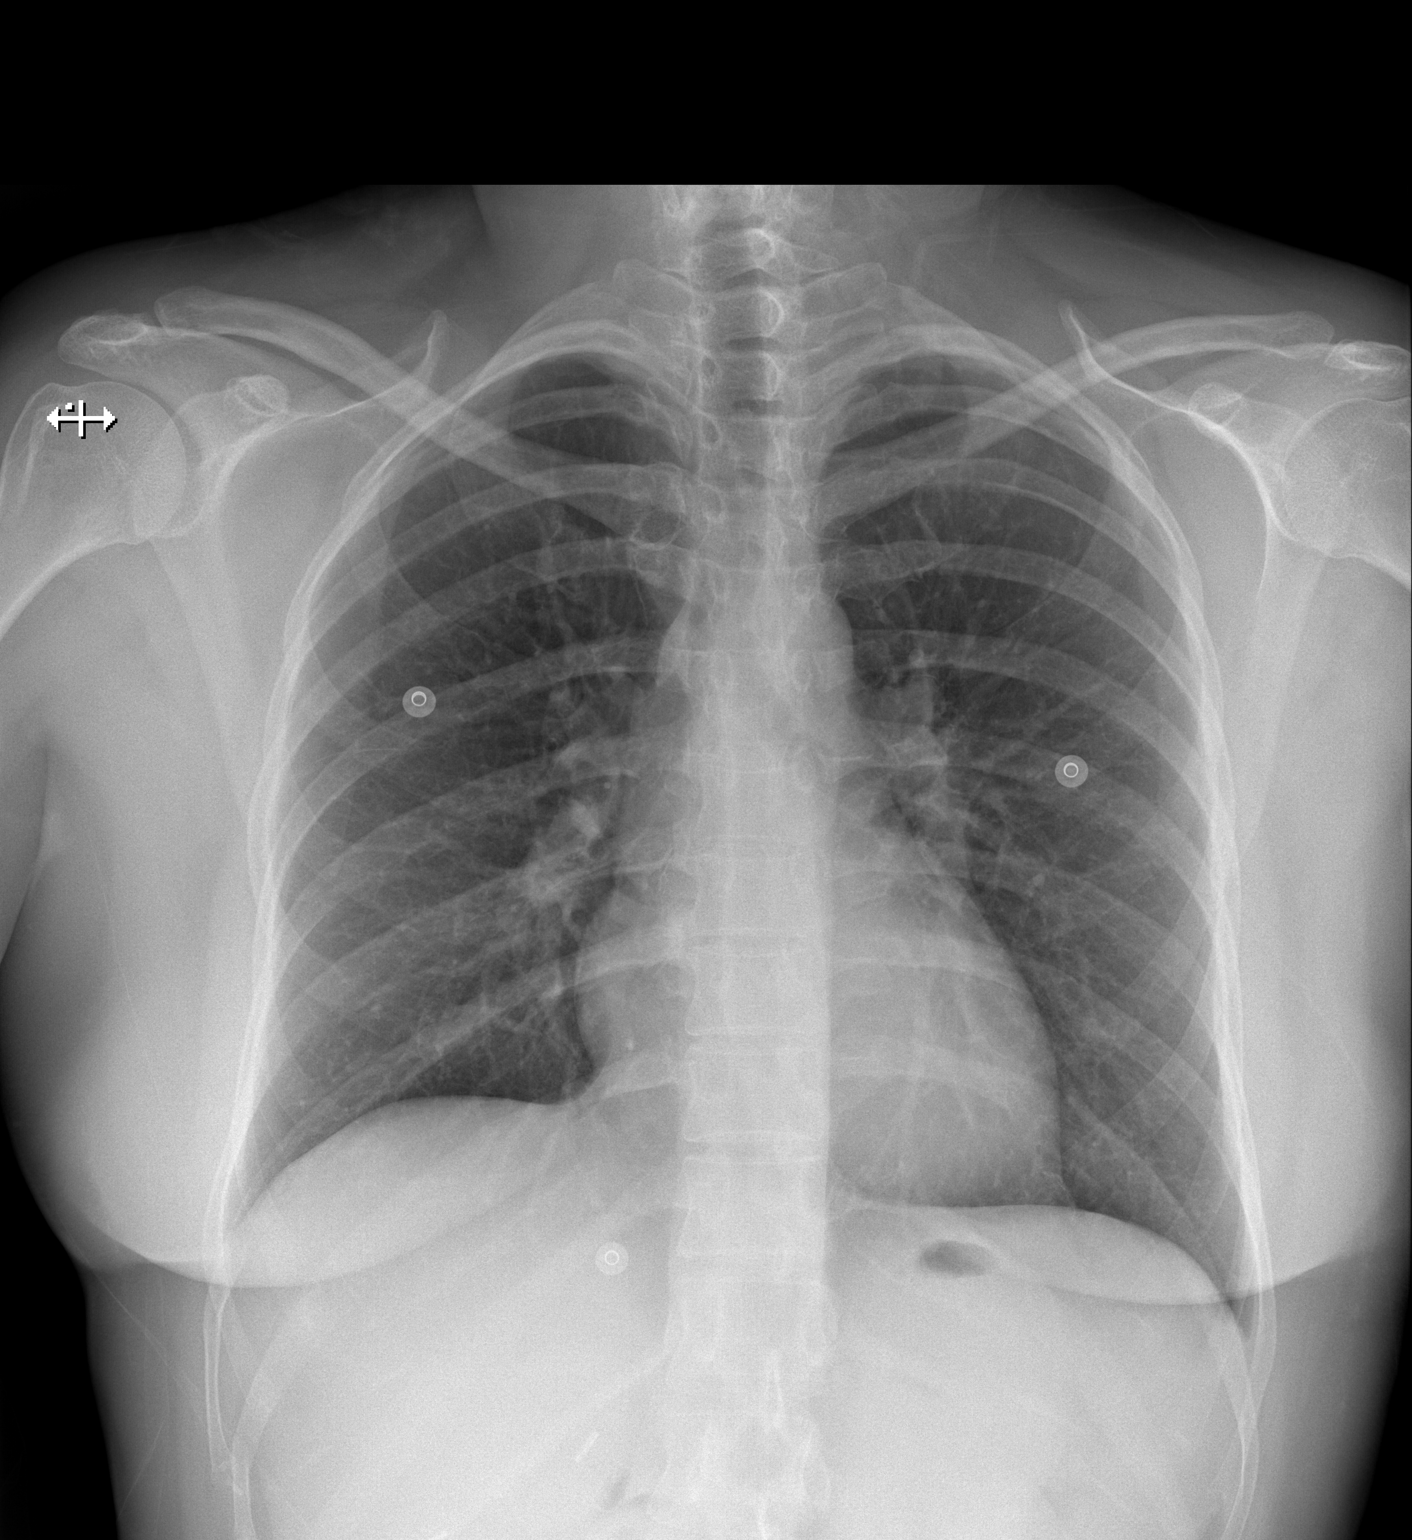
[im 2/2]
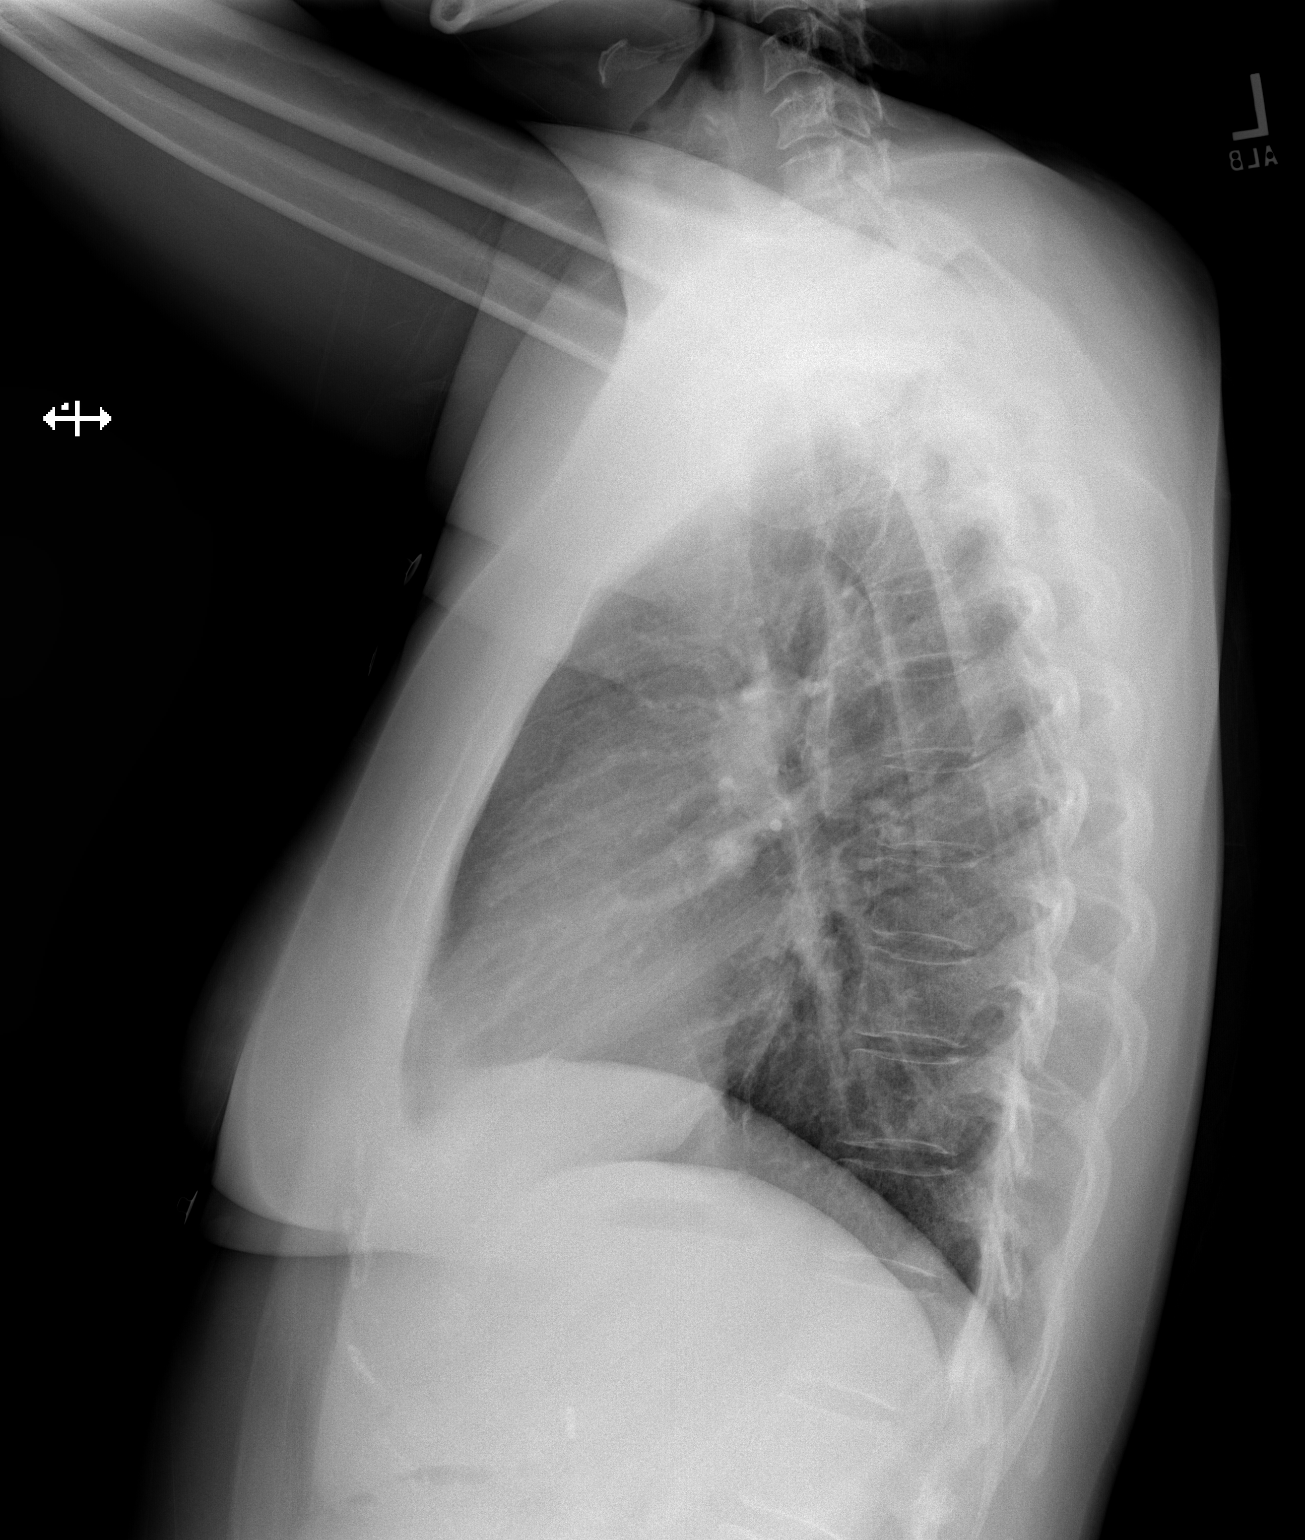

[2 of 2 positions shown; findings below may reference images not displayed]

IMPRESSION: 1. Chest radiograph without evidence of acute cardiopulmonary disease.
2. Comparison is made to prior study dated 02/12/2013.

## 2015-01-31 ENCOUNTER — Ambulatory Visit (INDEPENDENT_AMBULATORY_CARE_PROVIDER_SITE_OTHER): Payer: Federal, State, Local not specified - PPO | Admitting: Internal Medicine

## 2015-01-31 ENCOUNTER — Encounter: Payer: Self-pay | Admitting: Internal Medicine

## 2015-01-31 VITALS — BP 129/82 | HR 79 | Ht 64.0 in | Wt 162.8 lb

## 2015-01-31 DIAGNOSIS — G909 Disorder of the autonomic nervous system, unspecified: Secondary | ICD-10-CM

## 2015-01-31 DIAGNOSIS — G901 Familial dysautonomia [Riley-Day]: Secondary | ICD-10-CM

## 2015-01-31 NOTE — Progress Notes (Signed)
      Patient Care Team: Provider Default, MD as PCP - General (Orthopedic Surgery)   HPI  Lori [name] is a 49 y.o. female Seen in followup for palpitations in the context of what I thought on evaluation December 2013 was dysautonomia. She had accompanying lightheadedness residual fatigue and chest discomfort.prior evaluation had also suggested that she had a "hole in the heart". Recent echocardiogram did not support that diagnosis. Event recorder demonstrated only an infrequent PVC.   When we last saw her on dysautonomic therapy she had been doing better, albeit in late fall     She had an accident in [facility] April 2015 and she has complaints fo fatigue and headache and conginitive challenges dating back to then  There has not been significant symptoms of tachycardia palpitations or lightheadedness.  There are social stresses.  Past Medical History  Diagnosis Date  . Cancer, uterine 1998  . Chest pain     normal LV function by echo 10/2010  . Palpitations   . Idioventricular rhythm   . High cholesterol   . Kidney stone     Past Surgical History  Procedure Laterality Date  . Wrist surgery      broken wrist,left  . Cholecystectomy    . Cesarean section      x2  . Appendectomy    . Surgery on face      AFTER FALL  . Patent foramen ovale closure    . Neurocardiogenic syncope      Current Outpatient Prescriptions  Medication Sig Dispense Refill  . aspirin 81 MG tablet Take 81 mg by mouth as needed for pain.    Marland Kitchen ibuprofen (ADVIL,MOTRIN) 800 MG tablet     . nitroGLYCERIN (NITROSTAT) 0.4 MG SL tablet Place 1 tablet (0.4 mg total) under the tongue every 5 (five) minutes as needed for chest pain. 25 tablet 6  . rosuvastatin (CRESTOR) 10 MG tablet TAKE 1 TABLET (10 MG TOTAL) EVERY OTHER DAY.    . traMADol (ULTRAM) 50 MG tablet Take 50 mg by mouth every 6 (six) hours as needed.     No current facility-administered medications for this visit.    Allergies  Allergen  Reactions  . Sulfamethizole Hives    Review of Systems negative except from HPI and PMH  Physical Exam There were no vitals taken for this visit. Well developed and well nourished in no acute distress HENT normal E scleral and icterus clear Neck Supple JVP flat; carotids brisk and full Clear to ausculation  *Regular rate and rhythm, no murmurs gallops or rub Soft with active bowel sounds No clubbing cyanosis  Edema Alert and oriented, grossly normal motor and sensory function Skin Warm and Dry    Assessment and  Plan  Dysautonomia  Hypotension  anxiety  Anxiety is quite disruptive at this point and she failed lexapro  She is ion the process of establishing with primary care and we will begin citolopram in the interim  I have also given her a referral to [facility]   We spent more than 50% of our >45 min visit in face to face counseling regarding the above

## 2015-01-31 NOTE — Patient Instructions (Signed)
Your physician recommends that you continue on your current medications as directed. Please refer to the Current Medication list given to you today.  Your physician wants you to follow-up in: 6 months with [name] in [place].  You will receive a reminder letter in the mail two months in advance. If you don't receive a letter, please call our office to schedule the follow-up appointment.

## 2015-02-01 ENCOUNTER — Telehealth: Payer: Self-pay | Admitting: *Deleted

## 2015-02-01 MED ORDER — CITALOPRAM HYDROBROMIDE 20 MG PO TABS
20.0000 mg | ORAL_TABLET | Freq: Every day | ORAL | Status: DC
Start: 2015-02-01 — End: 2015-06-29

## 2015-02-01 NOTE — Telephone Encounter (Signed)
Reviewed with Dr. Lequita Halt. Order to start Citalopram 20 mg.  (pt is in process of finding a PCP whom will follow this medication) Rx sent to [facility] per patient request.

## 2015-02-01 NOTE — Telephone Encounter (Signed)
Patients daughter called and stated that Dr Caryl Comes told her that he was going to send her in an rx for an anxiety medication. She would like this called in to [facility] on [address] in [place]. Please advise. Thanks, MI

## 2015-04-03 ENCOUNTER — Telehealth: Payer: Self-pay

## 2015-04-03 NOTE — Telephone Encounter (Signed)
Pt c/o of Chest Pain: STAT if CP now or developed within 24 hours  1. Are you having CP right now? no  2. Are you experiencing any other symptoms (ex. SOB, nausea, vomiting, sweating)? No  3. How long have you been experiencing CP? Past 3 days  4. Is your CP continuous or coming and going? Comes and goes, lasts about 1 month  5. Have you taken Nitroglycerin? She did, she took one yesterday ?

## 2015-04-03 NOTE — Telephone Encounter (Signed)
S/w pt who indicates she has been having sharp pain located under her left ribs, under arm and towards her back. States when this happens, she becomes short of breath for a minute then goes back to normal. Denies diaphoresis, N/V. States she becomes dizzy during these episodes. Indicates her salt pill helps.  Reviewed signs to look for and when she would need to be taken to ER. Daughter states she would like her mother to be seen by [name].  Appt made for 6/7 at 8:30am, [place] office Daughter verbalized understanding and stated they would be here.

## 2015-04-04 ENCOUNTER — Ambulatory Visit (INDEPENDENT_AMBULATORY_CARE_PROVIDER_SITE_OTHER): Payer: Federal, State, Local not specified - PPO | Admitting: Internal Medicine

## 2015-04-04 ENCOUNTER — Encounter: Payer: Self-pay | Admitting: Internal Medicine

## 2015-04-04 VITALS — BP 120/80 | HR 74 | Ht 64.0 in | Wt 163.5 lb

## 2015-04-04 DIAGNOSIS — R0602 Shortness of breath: Secondary | ICD-10-CM

## 2015-04-04 DIAGNOSIS — R079 Chest pain, unspecified: Secondary | ICD-10-CM

## 2015-04-04 NOTE — Patient Instructions (Addendum)
Medication Instructions: - no changes  Labwork: - none  Procedures/Testing: - none  Follow-Up: - Your physician wants you to follow-up in: 6 months with Dr. Klein. You will receive a reminder letter in the mail two months in advance. If you don't receive a letter, please call our office to schedule the follow-up appointment.  Any Additional Special Instructions Will Be Listed Below (If Applicable). - none  

## 2015-04-04 NOTE — Progress Notes (Signed)
Patient Care Team: Provider Default, MD as PCP - General (Orthopedic Surgery)   HPI  Lori [name] is a 49 y.o. female Seen in followup for palpitations in the context of what I thought on evaluation December 2013 was dysautonomia. She had accompanying lightheadedness residual fatigue and chest discomfort.prior evaluation had also suggested that she had a "hole in the heart". Recent echocardiogram did not support that diagnosis. Event recorder demonstrated only an infrequent PVC.    She had an accident in [facility] April 2015 and she has complaints fo fatigue and headache and conginitive challenges dating back to then;  there have been challenges regarding her relationship with orthopedics at [facility] such that she has been "fired" from then. She has sought input from other physicians but apparently the summary letter  from [facility] has prompted them to be unwilling to see her.  Overall however, from an autonomic perspective she is feeling much better than she has in a long time. Apparently the citalopram has been beneficial; I did not ask if she availed herself of [facility].   Past Medical History  Diagnosis Date  . Cancer, uterine 1998  . Chest pain     normal LV function by echo 10/2010  . Palpitations   . Idioventricular rhythm   . High cholesterol   . Kidney stone     Past Surgical History  Procedure Laterality Date  . Wrist surgery      broken wrist,left  . Cholecystectomy    . Cesarean section      x2  . Appendectomy    . Surgery on face      AFTER FALL  . Patent foramen ovale closure    . Neurocardiogenic syncope      Current Outpatient Prescriptions  Medication Sig Dispense Refill  . aspirin 81 MG tablet Take 81 mg by mouth as needed for pain.    . B Complex Vitamins (VITAMIN B COMPLEX PO) Take 1 tablet by mouth daily.    . citalopram (CELEXA) 20 MG tablet Take 1 tablet (20 mg total) by mouth daily. 30 tablet 1  . Multiple Vitamin (MULTI-VITAMINS) TABS Take 1  tablet by mouth daily.     No current facility-administered medications for this visit.    Allergies  Allergen Reactions  . Ciprofloxacin Hcl Other (See Comments)    Heart condition  Lowers blood pressure  . Cyclobenzaprine Other (See Comments)    Angina; lowers BP  . Nortriptyline Other (See Comments)    Angina, lowers BP  . Prednisone Other (See Comments)    Has heart condition causes BP to gt too low.  Francine Graven Hives    Review of Systems negative except from HPI and PMH  Physical Exam BP 120/80 mmHg  Pulse 74  Ht 5\' 4"  (1.626 m)  Wt 74.163 kg (163 lb 8 oz)  BMI 28.05 kg/m2 Well developed and well nourished in no acute distress HENT normal E scleral and icterus clear Neck Supple JVP flat; carotids brisk and full Clear to ausculation  *Regular rate and rhythm, no murmurs gallops or rub Soft with active bowel sounds No clubbing cyanosis  Edema Alert and oriented, grossly normal motor and sensory function Skin Warm and Dry  ECG demonstrates sinus rhythm at 74 Intervals 14/08/40  Assessment and  Plan  Dysautonomia  anxiety  Symptoms have been much improved. The citalopram seems to be largely effective. She is going to [place]. We discussed the implications of air travel and high  altitude as relates to propensity towards dehydration and the need for volume repletion.  I have suggested, in the context of her problems with her orthopedist from [facility], to address a letter to Dr. Apolonio Schneiders the medical director at [facility] to see if he might be able to offer some insight as to how to resolve the problem which has left her without [facility] orth[facility]ics here    We spent more than 50% of our >25 min visit in face to face counseling regarding the above

## 2015-05-09 ENCOUNTER — Ambulatory Visit: Payer: Self-pay | Admitting: Family Medicine

## 2015-05-22 ENCOUNTER — Telehealth: Payer: Self-pay | Admitting: *Deleted

## 2015-05-22 NOTE — Telephone Encounter (Signed)
Pt daughter calling stating pt is having Chest Pain Pn number is going down to 60   Pt c/o of Chest Pain: STAT if CP now or developed within 24 hours  1. Are you having CP right now? Yes   2. Are you experiencing any other symptoms (ex. SOB, nausea, vomiting, sweating)?  Just SOB  3. How long have you been experiencing CP? Since last night  4. Is your CP continuous or coming and going? Coming and going    5. Have you taken Nitroglycerin? No she has not.  ?

## 2015-05-22 NOTE — Telephone Encounter (Signed)
S/w pt daughter Lori [name] who states pt has been SOB and having intermittent chest pain, left arm pain, nauseous with headache since 10pm last night. SOB has become worse this morning.  States oxygen level 88% on RA three hours ago but has increased to 98% Heart rate low 60s. Advised pt daughter to contact EMS immediately. Pt daughter verbalized understanding and states she will call EMS now. Had no further questions.

## 2015-05-30 ENCOUNTER — Other Ambulatory Visit: Payer: Self-pay | Admitting: Internal Medicine

## 2015-05-31 NOTE — Telephone Encounter (Signed)
Pt requesting Rx on Citalopram 20 mg. Please advise if ok to refill.

## 2015-06-22 DIAGNOSIS — N135 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: Secondary | ICD-10-CM | POA: Insufficient documentation

## 2015-06-22 DIAGNOSIS — R109 Unspecified abdominal pain: Secondary | ICD-10-CM | POA: Insufficient documentation

## 2015-06-29 ENCOUNTER — Encounter: Payer: Self-pay | Admitting: Family Medicine

## 2015-06-29 ENCOUNTER — Ambulatory Visit (INDEPENDENT_AMBULATORY_CARE_PROVIDER_SITE_OTHER): Payer: Federal, State, Local not specified - PPO | Admitting: Family Medicine

## 2015-06-29 VITALS — BP 118/60 | HR 84 | Temp 97.3°F | Resp 16 | Ht 64.0 in | Wt 168.0 lb

## 2015-06-29 DIAGNOSIS — K589 Irritable bowel syndrome without diarrhea: Secondary | ICD-10-CM | POA: Diagnosis not present

## 2015-06-29 DIAGNOSIS — Z23 Encounter for immunization: Secondary | ICD-10-CM

## 2015-06-29 DIAGNOSIS — G901 Familial dysautonomia [Riley-Day]: Secondary | ICD-10-CM

## 2015-06-29 DIAGNOSIS — E663 Overweight: Secondary | ICD-10-CM | POA: Diagnosis not present

## 2015-06-29 DIAGNOSIS — M5412 Radiculopathy, cervical region: Secondary | ICD-10-CM | POA: Diagnosis not present

## 2015-06-29 DIAGNOSIS — E785 Hyperlipidemia, unspecified: Secondary | ICD-10-CM

## 2015-06-29 DIAGNOSIS — G909 Disorder of the autonomic nervous system, unspecified: Secondary | ICD-10-CM | POA: Diagnosis not present

## 2015-06-29 DIAGNOSIS — F418 Other specified anxiety disorders: Secondary | ICD-10-CM

## 2015-06-29 MED ORDER — DULOXETINE HCL 30 MG PO CPEP: 30.0000 mg | ORAL_CAPSULE | Freq: Every day | ORAL | Status: DC

## 2015-06-29 MED ORDER — HYOSCYAMINE SULFATE 0.125 MG PO TABS: 0.1250 mg | ORAL_TABLET | ORAL | Status: DC | PRN

## 2015-06-29 NOTE — Progress Notes (Signed)
Name: Lori [name]   MRN: [id number]    DOB: 1966-07-30   Date:06/29/2015       Progress Note  Subjective  Chief Complaint  Chief Complaint  Patient presents with  . Establish Care    no problems or concerns just wants primary care doctor  . Labs Only    would like workup labs chol,BG,liver,kidney    HPI  Depression/Anxiety: seasonal - since she was diagnosed with dysautonomia  sinc.Symptoms are worse during the Summer. She is afraid to have recurrence of syncopal episodes during the Summer.  She feels down and feels anxious during this time of the year.   Dysautonomia: She had the first episode 15 years ago. Worse since she moved here 2 years ago.  Seeing  Dr. Hedy Camara and [name]. She avoids staying in the heat. Symptoms improves when she lays down and rests.   Dyslipidemia: she tried Crestor but it caused myalgia.  No labs past year.   Overweight: she moved from [place] 7 years ago to [place] after that [place], and she gained weight since. Her weight used to be 150 lbs.   IBS: she states triggered by stress, she has pain, tenesmus. Never took medication for that.   Patient Active Problem List   Diagnosis Date Noted  . Overweight (BMI 25.0-29.9) 06/29/2015  . Right flank pain 06/22/2015  . 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 Cervical radiculitis 07/19/2014  . 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 Dysautonomia 10/01/2012  . History of syncope 07/07/2012  . Depression with anxiety 06/11/2011  . Dyslipidemia 11/02/2010    Past Surgical History  Procedure Laterality Date  . Wrist surgery      broken wrist,left  . Cholecystectomy    . Cesarean section      x2  . Appendectomy    . Surgery on face      AFTER FALL  . Patent foramen ovale closure    . Neurocardiogenic syncope      Family History  Problem Relation Age of Onset  . Hypertension Mother   . Heart disease Father 57    MI    Social History   Social  History  . Marital Status: Married    Spouse Name: N/A  . Number of Children: N/A  . Years of Education: N/A   Occupational History  . Not on file.   Social History Main Topics  . Smoking status: Former Smoker -- 1.00 packs/day for 5 years    Types: Cigarettes    Quit date: 02/11/2006  . Smokeless tobacco: Never Used  . Alcohol Use: No     Comment: OCCASIONAL  . Drug Use: No  . Sexual Activity: Yes   Other Topics Concern  . Not on file   Social History Narrative     Current outpatient prescriptions:  .  aspirin 81 MG tablet, Take 81 mg by mouth as needed for pain., Disp: , Rfl:  .  B Complex Vitamins (VITAMIN B COMPLEX PO), Take 1 tablet by mouth daily., Disp: , Rfl:  .  Multiple Vitamin (MULTI-VITAMINS) TABS, Take 1 tablet by mouth daily., Disp: , Rfl:   Allergies  Allergen Reactions  . Ciprofloxacin Hcl Other (See Comments)    Heart condition  Lowers blood pressure  . Cyclobenzaprine Other (See Comments)    Angina; lowers BP  . Nortriptyline Other (See Comments)    Angina, lowers BP  . Prednisone Other (See Comments)    Has heart condition  causes BP to gt too low.  . Sulfamethizole Hives     ROS  Constitutional: Negative for fever , positive for  weight change.  Respiratory: Negative for cough and shortness of breath.   Cardiovascular: Negative for chest pain or palpitations.  Gastrointestinal: Negative for abdominal pain, no bowel changes.  Musculoskeletal: Negative for gait problem or joint swelling.  Skin: Negative for rash.  Neurological: Negative for  headache.  No other specific complaints in a complete review of systems (except as listed in HPI above).  Objective  Filed Vitals:   06/29/15 1524  BP: 118/60  Pulse: 84  Temp: 97.3 F (36.3 C)  TempSrc: Oral  Resp: 16  Height: 5\' 4"  (1.626 m)  Weight: 168 lb (76.204 kg)  SpO2: 98%    Body mass index is 28.82 kg/(m^2).  Physical Exam  Constitutional: Patient appears well-developed and  well-nourished. No distress. Overweight  HENT: Head: Normocephalic and atraumatic. Ears: B TMs ok, no erythema or effusion; Nose: Nose normal. Mouth/Throat: Oropharynx is clear and moist. No oropharyngeal exudate.  Eyes: Conjunctivae and EOM are normal. Pupils are equal, round, and reactive to light. No scleral icterus.  Neck: Normal range of motion. Neck supple. No JVD present. No thyromegaly present.  Cardiovascular: Normal rate, regular rhythm and normal heart sounds.  No murmur heard. No BLE edema. Pulmonary/Chest: Effort normal and breath sounds normal. No respiratory distress. Abdominal: Soft. Bowel sounds are normal, no distension. There is mild  Tenderness diffusely . no masses Musculoskeletal: Normal range of motion, no joint effusions. No gross deformities Neck pain with rom Neurological: he is alert and oriented to person, place, and time. No cranial nerve deficit. Coordination, balance, strength, speech and gait are normal.  Skin: Skin is warm and dry. No rash noted. No erythema.  Psychiatric: Patient has a normal mood and affect. behavior is normal. Judgment and thought content normal.  PHQ2/9: Depression screen PHQ 2/9 06/29/2015  Decreased Interest 0  Down, Depressed, Hopeless 0  PHQ - 2 Score 0     Fall Risk: Fall Risk  06/29/2015  Falls in the past year? Yes  Number falls in past yr: 1  Injury with Fall? Yes     Assessment & Plan  1. Depression with anxiety She does not want medication at this time, discussed Cymbalta since she also has pain from neck pain and radiculitis. She is wiling to try the medication  - Comprehensive metabolic panel - TSH - CBC with Differential/Platelet - DULoxetine (CYMBALTA) 30 MG capsule; Take 1 capsule (30 mg total) by mouth daily.  Dispense: 60 capsule; Refill: 0  2. Needs flu shot  - Flu Vaccine QUAD 36+ mos PF IM (Fluarix & Fluzone Quad PF)  3. Need for zoster vaccination  - Varicella-zoster vaccine subcutaneous  4.  Dysautonomia Continue follow up with [name]  5. Dyslipidemia  - Lipid panel  6. Overweight (BMI 25.0-29.9) Discussed with the patient the risk posed by an increased BMI. Discussed importance of portion control, calorie counting and at least 150 minutes of physical activity weekly. Avoid sweet beverages and drink more water. Eat at least 6 servings of fruit and vegetables daily   7. IBS (irritable bowel syndrome)  - hyoscyamine (LEVSIN) 0.125 MG tablet; Take 1 tablet (0.125 mg total) by mouth every 4 (four) hours as needed.  Dispense: 30 tablet; Refill: 0  8. Cervical radiculitis Seen by Ortho and recently had steroid injections and is feeling better - DULoxetine (CYMBALTA) 30 MG capsule; Take 1  capsule (30 mg total) by mouth daily.  Dispense: 60 capsule; Refill: 0

## 2015-07-25 ENCOUNTER — Other Ambulatory Visit: Payer: Self-pay | Admitting: Family Medicine

## 2015-07-25 NOTE — Telephone Encounter (Signed)
Patient requesting refill. 

## 2015-08-01 ENCOUNTER — Ambulatory Visit: Payer: Federal, State, Local not specified - PPO | Admitting: Family Medicine

## 2015-08-24 ENCOUNTER — Encounter: Payer: Federal, State, Local not specified - PPO | Admitting: Family Medicine

## 2015-08-29 ENCOUNTER — Encounter: Payer: Self-pay | Admitting: Family Medicine

## 2015-08-29 DIAGNOSIS — M47812 Spondylosis without myelopathy or radiculopathy, cervical region: Secondary | ICD-10-CM | POA: Insufficient documentation

## 2015-09-14 LAB — COMPREHENSIVE METABOLIC PANEL
ALK PHOS: 88 IU/L (ref 39–117)
ALT: 23 IU/L (ref 0–32)
AST: 15 IU/L (ref 0–40)
Albumin/Globulin Ratio: 1.7 (ref 1.1–2.5)
Albumin: 4.3 g/dL (ref 3.5–5.5)
BILIRUBIN TOTAL: 0.4 mg/dL (ref 0.0–1.2)
BUN/Creatinine Ratio: 16 (ref 9–23)
BUN: 10 mg/dL (ref 6–24)
CHLORIDE: 101 mmol/L (ref 97–106)
CO2: 24 mmol/L (ref 18–29)
CREATININE: 0.64 mg/dL (ref 0.57–1.00)
Calcium: 9 mg/dL (ref 8.7–10.2)
GFR calc Af Amer: 120 mL/min/{1.73_m2} (ref >59)
GFR calc non Af Amer: 104 mL/min/{1.73_m2} (ref >59)
GLUCOSE: 116 mg/dL — AB (ref 65–99)
Globulin, Total: 2.6 g/dL (ref 1.5–4.5)
Potassium: 4.6 mmol/L (ref 3.5–5.2)
Sodium: 140 mmol/L (ref 136–144)
Total Protein: 6.9 g/dL (ref 6.0–8.5)

## 2015-09-14 LAB — CBC WITH DIFFERENTIAL/PLATELET
BASOS ABS: 0 10*3/uL (ref 0.0–0.2)
BASOS: 0 %
EOS (ABSOLUTE): 0.2 10*3/uL (ref 0.0–0.4)
Eos: 2 %
Hematocrit: 41.6 % (ref 34.0–46.6)
Hemoglobin: 14.1 g/dL (ref 11.1–15.9)
IMMATURE GRANULOCYTES: 0 %
Immature Grans (Abs): 0 10*3/uL (ref 0.0–0.1)
Lymphocytes Absolute: 2.3 10*3/uL (ref 0.7–3.1)
Lymphs: 24 %
MCH: 29.3 pg (ref 26.6–33.0)
MCHC: 33.9 g/dL (ref 31.5–35.7)
MCV: 86 fL (ref 79–97)
MONOS ABS: 0.6 10*3/uL (ref 0.1–0.9)
Monocytes: 6 %
NEUTROS ABS: 6.6 10*3/uL (ref 1.4–7.0)
NEUTROS PCT: 68 %
PLATELETS: 254 10*3/uL (ref 150–379)
RBC: 4.82 x10E6/uL (ref 3.77–5.28)
RDW: 13.8 % (ref 12.3–15.4)
WBC: 9.6 10*3/uL (ref 3.4–10.8)

## 2015-09-14 LAB — TSH: TSH: 1.47 u[IU]/mL (ref 0.450–4.500)

## 2015-09-14 LAB — LIPID PANEL
CHOL/HDL RATIO: 5.7 ratio — AB (ref 0.0–4.4)
CHOLESTEROL TOTAL: 303 mg/dL — AB (ref 100–199)
HDL: 53 mg/dL (ref >39)
LDL CALC: 219 mg/dL — AB (ref 0–99)
Triglycerides: 156 mg/dL — ABNORMAL HIGH (ref 0–149)
VLDL CHOLESTEROL CAL: 31 mg/dL (ref 5–40)

## 2015-09-14 LAB — COMMENT

## 2015-09-15 ENCOUNTER — Encounter: Payer: Self-pay | Admitting: Family Medicine

## 2015-09-15 ENCOUNTER — Ambulatory Visit (INDEPENDENT_AMBULATORY_CARE_PROVIDER_SITE_OTHER): Payer: Federal, State, Local not specified - PPO | Admitting: Family Medicine

## 2015-09-15 VITALS — BP 116/80 | HR 88 | Temp 98.0°F | Resp 18 | Ht 66.0 in | Wt 172.2 lb

## 2015-09-15 DIAGNOSIS — R739 Hyperglycemia, unspecified: Secondary | ICD-10-CM | POA: Diagnosis not present

## 2015-09-15 DIAGNOSIS — H6983 Other specified disorders of Eustachian tube, bilateral: Secondary | ICD-10-CM | POA: Diagnosis not present

## 2015-09-15 DIAGNOSIS — H6980 Other specified disorders of Eustachian tube, unspecified ear: Secondary | ICD-10-CM | POA: Insufficient documentation

## 2015-09-15 DIAGNOSIS — J4520 Mild intermittent asthma, uncomplicated: Secondary | ICD-10-CM | POA: Diagnosis not present

## 2015-09-15 DIAGNOSIS — E785 Hyperlipidemia, unspecified: Secondary | ICD-10-CM

## 2015-09-15 MED ORDER — BENZONATATE 100 MG PO CAPS: 100.0000 mg | ORAL_CAPSULE | Freq: Three times a day (TID) | ORAL | Status: DC | PRN

## 2015-09-15 MED ORDER — BECLOMETHASONE DIPROPIONATE 40 MCG/ACT IN AERS: 2.0000 | INHALATION_SPRAY | Freq: Two times a day (BID) | RESPIRATORY_TRACT | Status: DC

## 2015-09-15 MED ORDER — LEVALBUTEROL TARTRATE 45 MCG/ACT IN AERO: 2.0000 | INHALATION_SPRAY | Freq: Four times a day (QID) | RESPIRATORY_TRACT | Status: DC | PRN

## 2015-09-15 MED ORDER — COQ10 100 MG PO CAPS: 1.0000 | ORAL_CAPSULE | Freq: Every day | ORAL | Status: DC

## 2015-09-15 MED ORDER — PRAVASTATIN SODIUM 40 MG PO TABS
40.0000 mg | ORAL_TABLET | Freq: Every day | ORAL | Status: DC
Start: 2015-09-15 — End: 2015-10-03

## 2015-09-15 NOTE — Patient Instructions (Signed)
La diabetes mellitus y los alimentos (Diabetes Mellitus and Food) Es importante que controle su nivel de azcar en la sangre (glucosa). El nivel de glucosa en sangre depende en gran medida de lo que usted come. Comer alimentos saludables en las cantidades [place] a lo largo del Training and development officer, aproximadamente a la misma hora [employer], lo ayudar a Chief Technology Officer su nivel de Multimedia programmer. Tambin puede ayudarlo a retrasar o Patent attorney de la diabetes mellitus. Comer de [employer] saludable incluso puede ayudarlo a Chartered loss adjuster de presin arterial y a Science writer o Theatre manager un peso saludable.  Entre las recomendaciones generales para alimentarse y Audiological scientist los alimentos de forma saludable, se incluyen las siguientes:  Respetar las comidas principales y comer colaciones con regularidad. Evitar pasar largos perodos sin comer con el fin de perder peso.  Seguir una dieta que consista principalmente en alimentos de origen vegetal, como frutas, vegetales, frutos secos, legumbres y cereales integrales.  Utilizar mtodos de coccin a baja temperatura, como hornear, en lugar de mtodos de coccin a alta temperatura, como frer en abundante aceite. Trabaje con el nutricionista para aprender a Financial planner nutricional de las etiquetas de los alimentos. CMO PUEDEN AFECTARME LOS ALIMENTOS? Carbohidratos Los carbohidratos afectan el nivel de glucosa en sangre ms que cualquier otro tipo de alimento. El nutricionista lo ayudar a Teacher, adult education cuntos carbohidratos puede consumir en cada comida y ensearle a contarlos. El recuento de carbohidratos es importante para mantener la glucosa en sangre en un nivel saludable, en especial si utiliza insulina o toma determinados medicamentos para la diabetes mellitus. Alcohol El alcohol puede provocar disminuciones sbitas de la glucosa en sangre (hipoglucemia), en especial si utiliza insulina o toma determinados medicamentos para la diabetes mellitus. La  hipoglucemia es una afeccin que puede poner en peligro la vida. Los sntomas de la hipoglucemia (somnolencia, mareos y Data processing manager) son similares a los sntomas de haber consumido mucho alcohol.  Si el mdico lo autoriza a beber alcohol, hgalo con moderacin y siga estas pautas:  Las mujeres no deben beber ms de un trago por da, y los hombres no deben beber ms de dos tragos por Training and development officer. Un trago es igual a:  12 onzas (355 ml) de cerveza  5 onzas de vino (150 ml) de vino  1,5onzas (23m) de bebidas espirituosas  No beba con el estmago vaco.  Mantngase hidratado. Beba agua, gaseosas dietticas o t helado sin azcar.  Las gaseosas comunes, los jugos y otros refrescos podran contener muchos carbohidratos y se dCivil Service fast streamer QU ALIMENTOS NO SE RECOMIENDAN? Cuando haga las elecciones de alimentos, es importante que recuerde que todos los alimentos son distintos. Algunos tienen menos nutrientes que otros por porcin, aunque podran tener la misma cantidad de caloras o carbohidratos. Es difcil darle al cuerpo lo que necesita cuando consume alimentos con menos nutrientes. Estos son algunos ejemplos de alimentos que debera evitar ya que contienen muchas caloras y carbohidratos, pero pocos nutrientes:  GPhysicist, medicaltrans (la mayora de los alimentos procesados incluyen grasas trans en la etiqueta de Informacin nutricional).  Gaseosas comunes.  Jugos.  Caramelos.  Dulces, como tortas, pasteles, rosquillas y [place]  Comidas fritas. QU ALIMENTOS PUEDO COMER? Consuma alimentos ricos en nutrientes, que nutrirn el cuerpo y lo mantendrn saludable. Los alimentos que debe comer tambin dependern de varios factores, como:  Las caloras que necesita.  Los medicamentos que toma.  Su peso.  El nivel de glucosa en sMarist College  El nArrow Rockde presin arterial.  El nivel de colesterol.  Debe consumir una amplia variedad de alimentos, por ejemplo:  Protenas.  Cortes de carne  magros.  Protenas con bajo contenido de grasas saturadas, como pescado, clara de huevo y frijoles. Evite las carnes procesadas.  Frutas y vegetales.  Frutas y vegetales que pueden ayudar a controlar los niveles sanguneos de glucosa, como manzanas, mangos y batatas.  Productos lcteos.  Elija productos lcteos sin grasa o con bajo contenido de grasa, como leche, yogur y queso.  Cereales, panes, pastas y arroz.  Elija cereales integrales, como panes multicereales, avena en grano y arroz integral. Estos alimentos pueden ayudar a controlar la presin arterial.  Grasas.  Alimentos que contengan grasas saludables, como frutos secos, aguacate, aceite de oliva, aceite de canola y pescado. TODOS LOS QUE PADECEN DIABETES MELLITUS TIENEN EL MISMO PLAN DE COMIDAS? Dado que todas las personas que padecen diabetes mellitus son distintas, no hay un solo plan de comidas que funcione para todos. Es muy importante que se rena con un nutricionista que lo ayudar a crear un plan de comidas adecuado para usted.   Esta informacin no tiene como fin reemplazar el consejo del mdico. Asegrese de hacerle al mdico cualquier pregunta que tenga.   Document Released: 01/21/2008 Document Revised: 11/04/2014 Elsevier Interactive Patient Education 2016 Elsevier Inc.  

## 2015-09-15 NOTE — Progress Notes (Signed)
Name: Lori [name]   MRN: [id number]    DOB: [birth year]   Date:09/15/2015       Progress Note  Subjective  Chief Complaint  Chief Complaint  Patient presents with  . URI    Onset 2 weeks, unchanged, sinus drainage, bilateral ear pressure/pain, sinus pressure, scratchy throat and has tried the nasal sprays and Theraflu with some relief.     HPI  URI she got sick with cough, ear pressure, scratchy throat and nasal congestion with post-nasal drainage 2 weeks ago. No fever.   Asthma exacerbation: states used inhalers as a child. She has been feeling chest tightness and dry cough, that is getting a little productive, white sputum, no wheezing.   Hyperlipidemia: she could not tolerate Crestor and is afraid of taking medication for it. Discussed risk and she is willing to try Pravastatin with Co-Q 10  Eustachian Tube Dysfunction: seen by ENT, was given nasal spray but she stopped using it, she did not like it.   Hyperglycemia: her diet is high in carbohydrates, she denies polyphagia, polyuria or polydipsia.    Patient Active Problem List   Diagnosis Date Noted  . Eustachian tube dysfunction 09/15/2015  . Asthma, mild intermittent, poorly controlled 09/15/2015  . Cervical spondylosis 08/29/2015  . Overweight (BMI 25.0-29.9) 06/29/2015  . IBS (irritable bowel syndrome) 06/29/2015  . Right flank pain 06/22/2015  . 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 Cervical radiculitis 07/19/2014  . 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 Dysautonomia 10/01/2012  . History of syncope 07/07/2012  . Depression with anxiety 06/11/2011  . Dyslipidemia 11/02/2010    Past Surgical History  Procedure Laterality Date  . Wrist surgery      broken wrist,left  . Cholecystectomy    . Cesarean section      x2  . Appendectomy    . Surgery on face      AFTER FALL  . Patent foramen ovale closure    . Neurocardiogenic syncope      Family History  Problem  Relation Age of Onset  . Hypertension Mother   . Heart disease Father 15    MI    Social History   Social History  . Marital Status: Married    Spouse Name: N/A  . Number of Children: N/A  . Years of Education: N/A   Occupational History  . Not on file.   Social History Main Topics  . Smoking status: Former Smoker -- 1.00 packs/day for 5 years    Types: Cigarettes    Quit date: 02/11/2006  . Smokeless tobacco: Never Used  . Alcohol Use: No  . Drug Use: No  . Sexual Activity:    Partners: Male   Other Topics Concern  . Not on file   Social History Narrative     Current outpatient prescriptions:  .  aspirin 81 MG tablet, Take 81 mg by mouth as needed for pain., Disp: , Rfl:  .  B Complex Vitamins (VITAMIN B COMPLEX PO), Take 1 tablet by mouth daily., Disp: , Rfl:  .  fluticasone (FLONASE) 50 MCG/ACT nasal spray, Place into the nose., Disp: , Rfl:  .  Multiple Vitamin (MULTI-VITAMINS) TABS, Take 1 tablet by mouth daily., Disp: , Rfl:  .  Probiotic Product (PROBIOTIC ADVANCED PO), Take 1 each by mouth 1 day or 1 dose., Disp: , Rfl:  .  beclomethasone (QVAR) 40 MCG/ACT inhaler, Inhale 2 puffs into the lungs 2 (two) times daily., Disp: 1 Inhaler,  Rfl: 1 .  benzonatate (TESSALON) 100 MG capsule, Take 1-2 capsules (100-200 mg total) by mouth 3 (three) times daily as needed for cough., Disp: 40 capsule, Rfl: 0 .  Coenzyme Q10 (COQ10) 100 MG CAPS, Take 1 tablet by mouth daily., Disp: 30 each, Rfl: 0 .  levalbuterol (XOPENEX HFA) 45 MCG/ACT inhaler, Inhale 2 puffs into the lungs every 6 (six) hours as needed for wheezing., Disp: 1 Inhaler, Rfl: 0 .  pravastatin (PRAVACHOL) 40 MG tablet, Take 1 tablet (40 mg total) by mouth daily., Disp: 30 tablet, Rfl: 2  Allergies  Allergen Reactions  . Ciprofloxacin Hcl Other (See Comments)    Heart condition  Lowers blood pressure  . Cyclobenzaprine Other (See Comments)    Angina; lowers BP  . Nortriptyline Other (See Comments)    Angina,  lowers BP  . Prednisone Other (See Comments)    Has heart condition causes BP to gt too low.  . Sulfamethizole Hives     ROS  Constitutional: Negative for fever or weight change.  Respiratory: Positive  for cough no shortness of breath.   Cardiovascular: Negative for chest pain or palpitations.  Gastrointestinal: Negative for abdominal pain, no bowel changes.  Musculoskeletal: Negative for gait problem or joint swelling.  Skin: Negative for rash.  Neurological: Negative for dizziness or headache.  No other specific complaints in a complete review of systems (except as listed in HPI above).  Objective  Filed Vitals:   09/15/15 0909  BP: 116/80  Pulse: 88  Temp: 98 F (36.7 C)  TempSrc: Oral  Resp: 18  Height: 5\' 6"  (1.676 m)  Weight: 172 lb 3.2 oz (78.109 kg)  SpO2: 98%    Body mass index is 27.81 kg/(m^2).  Physical Exam  Constitutional: Patient appears well-developed and well-nourished. ObeseNo distress.  HEENT: head atraumatic, normocephalic, pupils equal and reactive to light, ears normal TM's bilaterally, neck supple, throat within normal limits, tender facial sinus, boggy turbinates, clear rhinorrhea Cardiovascular: Normal rate, regular rhythm and normal heart sounds.  No murmur heard. No BLE edema. Pulmonary/Chest: Effort normal and breath sounds normal. No respiratory distress. Abdominal: Soft.  There is no tenderness. Psychiatric: Patient has a normal mood and affect. behavior is normal. Judgment and thought content normal.  Recent Results (from the past 2160 hour(s))  Comprehensive metabolic panel     Status: Abnormal   Collection Time: 09/13/15  8:11 AM  Result Value Ref Range   Glucose 116 (H) 65 - 99 mg/dL   BUN 10 6 - 24 mg/dL   Creatinine, Ser 0.64 0.57 - 1.00 mg/dL   GFR calc non Af Amer 104 >59 mL/min/1.73   GFR calc Af Amer 120 >59 mL/min/1.73   BUN/Creatinine Ratio 16 9 - 23   Sodium 140 136 - 144 mmol/L   Potassium 4.6 3.5 - 5.2 mmol/L    Chloride 101 97 - 106 mmol/L   CO2 24 18 - 29 mmol/L   Calcium 9.0 8.7 - 10.2 mg/dL   Total Protein 6.9 6.0 - 8.5 g/dL   Albumin 4.3 3.5 - 5.5 g/dL   Globulin, Total 2.6 1.5 - 4.5 g/dL   Albumin/Globulin Ratio 1.7 1.1 - 2.5   Bilirubin Total 0.4 0.0 - 1.2 mg/dL   Alkaline Phosphatase 88 39 - 117 IU/L   AST 15 0 - 40 IU/L   ALT 23 0 - 32 IU/L  TSH     Status: None   Collection Time: 09/13/15  8:11 AM  Result Value Ref Range  TSH 1.470 0.450 - 4.500 uIU/mL  Lipid panel     Status: Abnormal   Collection Time: 09/13/15  8:11 AM  Result Value Ref Range   Cholesterol, Total 303 (H) 100 - 199 mg/dL   Triglycerides 156 (H) 0 - 149 mg/dL   HDL 53 >39 mg/dL   VLDL Cholesterol Cal 31 5 - 40 mg/dL   LDL Calculated 219 (H) 0 - 99 mg/dL   Chol/HDL Ratio 5.7 (H) 0.0 - 4.4 ratio units    Comment:                                   T. Chol/HDL Ratio                                             Men  Women                               1/2 Avg.Risk  3.4    3.3                                   Avg.Risk  5.0    4.4                                2X Avg.Risk  9.6    7.1                                3X Avg.Risk 23.4   11.0   CBC with Differential/Platelet     Status: None   Collection Time: 09/13/15  8:11 AM  Result Value Ref Range   WBC 9.6 3.4 - 10.8 x10E3/uL   RBC 4.82 3.77 - 5.28 x10E6/uL   Hemoglobin 14.1 11.1 - 15.9 g/dL   Hematocrit 41.6 34.0 - 46.6 %   MCV 86 79 - 97 fL   MCH 29.3 26.6 - 33.0 pg   MCHC 33.9 31.5 - 35.7 g/dL   RDW 13.8 12.3 - 15.4 %   Platelets 254 150 - 379 x10E3/uL   Neutrophils 68 %   Lymphs 24 %   Monocytes 6 %   Eos 2 %   Basos 0 %   Neutrophils Absolute 6.6 1.4 - 7.0 x10E3/uL   Lymphocytes Absolute 2.3 0.7 - 3.1 x10E3/uL   Monocytes Absolute 0.6 0.1 - 0.9 x10E3/uL   EOS (ABSOLUTE) 0.2 0.0 - 0.4 x10E3/uL   Basophils Absolute 0.0 0.0 - 0.2 x10E3/uL   Immature Granulocytes 0 %   Immature Grans (Abs) 0.0 0.0 - 0.1 x10E3/uL  Comment     Status: None    Collection Time: 09/13/15  8:11 AM  Result Value Ref Range   Comment Comment     Comment: According to ATP-III Guidelines, HDL-C >59 mg/dL is considered a negative risk factor for CHD.      PHQ2/9: Depression screen [facility] 2/9 09/15/2015 06/29/2015  Decreased Interest 0 0  Down, Depressed, Hopeless 0 0  PHQ - 2 Score 0 0    Fall Risk: Fall Risk  09/15/2015 06/29/2015  Falls in the past  year? No Yes  Number falls in past yr: - 1  Injury with Fall? - Yes     Functional Status Survey: Is the patient deaf or have difficulty hearing?: No Does the patient have difficulty seeing, even when wearing glasses/contacts?: Yes (glasses) Does the patient have difficulty concentrating, remembering, or making decisions?: No Does the patient have difficulty walking or climbing stairs?: No Does the patient have difficulty dressing or bathing?: No Does the patient have difficulty doing errands alone such as visiting a doctor's office or shopping?: No    Assessment & Plan  1. Asthma, mild intermittent, poorly controlled  Call back for antibiotics if worsening of symptoms over the weekend. She states not allergic to prednisone, told by cardiologist because of her bp  - levalbuterol (XOPENEX HFA) 45 MCG/ACT inhaler; Inhale 2 puffs into the lungs every 6 (six) hours as needed for wheezing.  Dispense: 1 Inhaler; Refill: 0 - beclomethasone (QVAR) 40 MCG/ACT inhaler; Inhale 2 puffs into the lungs 2 (two) times daily.  Dispense: 1 Inhaler; Refill: 1 - benzonatate (TESSALON) 100 MG capsule; Take 1-2 capsules (100-200 mg total) by mouth 3 (three) times daily as needed for cough.  Dispense: 40 capsule; Refill: 0  2. Eustachian tube dysfunction, bilateral  Discussed nasal spray and how to use it properly   3. Hyperglycemia  - Hemoglobin A1c  4. Dyslipidemia  We will try Pravastatin  - pravastatin (PRAVACHOL) 40 MG tablet; Take 1 tablet (40 mg total) by mouth daily.  Dispense: 30 tablet; Refill: 2 -  Coenzyme Q10 (COQ10) 100 MG CAPS; Take 1 tablet by mouth daily.  Dispense: 30 each; Refill: 0

## 2015-09-16 LAB — SPECIMEN STATUS REPORT

## 2015-09-16 LAB — HGB A1C W/O EAG: Hgb A1c MFr Bld: 5.8 % — ABNORMAL HIGH (ref 4.8–5.6)

## 2015-09-18 ENCOUNTER — Encounter: Payer: Self-pay | Admitting: Family Medicine

## 2015-10-03 ENCOUNTER — Ambulatory Visit (INDEPENDENT_AMBULATORY_CARE_PROVIDER_SITE_OTHER): Payer: Federal, State, Local not specified - PPO | Admitting: Family Medicine

## 2015-10-03 ENCOUNTER — Encounter: Payer: Self-pay | Admitting: Family Medicine

## 2015-10-03 VITALS — BP 112/68 | HR 96 | Temp 98.0°F | Resp 16 | Ht 66.0 in | Wt 166.4 lb

## 2015-10-03 DIAGNOSIS — E663 Overweight: Secondary | ICD-10-CM | POA: Diagnosis not present

## 2015-10-03 DIAGNOSIS — E785 Hyperlipidemia, unspecified: Secondary | ICD-10-CM | POA: Diagnosis not present

## 2015-10-03 DIAGNOSIS — E8881 Metabolic syndrome: Secondary | ICD-10-CM | POA: Insufficient documentation

## 2015-10-03 NOTE — Progress Notes (Signed)
Name: Lori [name]   MRN: [id number]    DOB: [birth year]   Date:10/03/2015       Progress Note  Subjective  Chief Complaint  Chief Complaint  Patient presents with  . Hyperlipidemia    stopped chol. med due to side effects of swelling throat and lips, and bleeding gums.    HPI  Dyslipidemia: she stopped Pravastatin because it caused side effects, such as swollen and bleeding gum, also some swelling on her mouth, but not sure if it was angioedema. She has changed her diet and is losing weight. She is eating tree nuts, fish twice weekly, lots of vegetables and fruit.  She is excited about her weight loss.    Metabolic Syndrome: she has changed her diet - avoiding sugar, sweet beverages and carbohydrates. She needs to start exercising 30 minutes daily   Patient Active Problem List   Diagnosis Date Noted  . Eustachian tube dysfunction 09/15/2015  . Asthma, mild intermittent, poorly controlled 09/15/2015  . Cervical spondylosis 08/29/2015  . Overweight (BMI 25.0-29.9) 06/29/2015  . IBS (irritable bowel syndrome) 06/29/2015  . Right flank pain 06/22/2015  . 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 Cervical radiculitis 07/19/2014  . 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 Dysautonomia 10/01/2012  . History of syncope 07/07/2012  . Depression with anxiety 06/11/2011  . Dyslipidemia 11/02/2010    Past Surgical History  Procedure Laterality Date  . Wrist surgery      broken wrist,left  . Cholecystectomy    . Cesarean section      x2  . Appendectomy    . Surgery on face      AFTER FALL  . Patent foramen ovale closure    . Neurocardiogenic syncope      Family History  Problem Relation Age of Onset  . Hypertension Mother   . Heart disease Father 47    MI    Social History   Social History  . Marital Status: Married    Spouse Name: N/A  . Number of Children: N/A  . Years of Education: N/A   Occupational History  . Not on file.    Social History Main Topics  . Smoking status: Former Smoker -- 1.00 packs/day for 5 years    Types: Cigarettes    Quit date: 02/11/2006  . Smokeless tobacco: Never Used  . Alcohol Use: No  . Drug Use: No  . Sexual Activity:    Partners: Male   Other Topics Concern  . Not on file   Social History Narrative     Current outpatient prescriptions:  .  aspirin 81 MG tablet, Take 81 mg by mouth as needed for pain., Disp: , Rfl:  .  B Complex Vitamins (VITAMIN B COMPLEX PO), Take 1 tablet by mouth daily., Disp: , Rfl:  .  beclomethasone (QVAR) 40 MCG/ACT inhaler, Inhale 2 puffs into the lungs 2 (two) times daily., Disp: 1 Inhaler, Rfl: 1 .  Coenzyme Q10 (COQ10) 100 MG CAPS, Take 1 tablet by mouth daily., Disp: 30 each, Rfl: 0 .  fluticasone (FLONASE) 50 MCG/ACT nasal spray, Place into the nose., Disp: , Rfl:  .  Multiple Vitamin (MULTI-VITAMINS) TABS, Take 1 tablet by mouth daily., Disp: , Rfl:  .  Probiotic Product (PROBIOTIC ADVANCED PO), Take 1 each by mouth 1 day or 1 dose., Disp: , Rfl:   Allergies  Allergen Reactions  . Atorvastatin Swelling  . Ciprofloxacin Hcl Other (See Comments)    Heart condition  Lowers blood pressure  . Cyclobenzaprine Other (See Comments)    Angina; lowers BP  . Nortriptyline Other (See Comments)    Angina, lowers BP  . Pravastatin Other (See Comments)    gingivitis  . Prednisone Other (See Comments)    Has heart condition causes BP to gt too low.  . Sulfamethizole Hives     ROS  Ten systems reviewed and is negative except as mentioned in HPI   Objective  Filed Vitals:   10/03/15 1035  BP: 112/68  Pulse: 96  Temp: 98 F (36.7 C)  TempSrc: Oral  Resp: 16  Height: 5\' 6"  (1.676 m)  Weight: 166 lb 6.4 oz (75.479 kg)  SpO2: 98%    Body mass index is 26.87 kg/(m^2).  Physical Exam  Constitutional: Patient appears well-developed and well-nourished. Obese No distress.  HEENT: head atraumatic, normocephalic, pupils equal and  reactive to light, neck supple, throat within normal limits Cardiovascular: Normal rate, regular rhythm and normal heart sounds.  No murmur heard. No BLE edema. Pulmonary/Chest: Effort normal and breath sounds normal. No respiratory distress. Abdominal: Soft.  There is no tenderness. Psychiatric: Patient has a normal mood and affect. behavior is normal. Judgment and thought content normal.  Recent Results (from the past 2160 hour(s))  Comprehensive metabolic panel     Status: Abnormal   Collection Time: 09/13/15  8:11 AM  Result Value Ref Range   Glucose 116 (H) 65 - 99 mg/dL   BUN 10 6 - 24 mg/dL   Creatinine, Ser 0.64 0.57 - 1.00 mg/dL   GFR calc non Af Amer 104 >59 mL/min/1.73   GFR calc Af Amer 120 >59 mL/min/1.73   BUN/Creatinine Ratio 16 9 - 23   Sodium 140 136 - 144 mmol/L   Potassium 4.6 3.5 - 5.2 mmol/L   Chloride 101 97 - 106 mmol/L   CO2 24 18 - 29 mmol/L   Calcium 9.0 8.7 - 10.2 mg/dL   Total Protein 6.9 6.0 - 8.5 g/dL   Albumin 4.3 3.5 - 5.5 g/dL   Globulin, Total 2.6 1.5 - 4.5 g/dL   Albumin/Globulin Ratio 1.7 1.1 - 2.5   Bilirubin Total 0.4 0.0 - 1.2 mg/dL   Alkaline Phosphatase 88 39 - 117 IU/L   AST 15 0 - 40 IU/L   ALT 23 0 - 32 IU/L  TSH     Status: None   Collection Time: 09/13/15  8:11 AM  Result Value Ref Range   TSH 1.470 0.450 - 4.500 uIU/mL  Lipid panel     Status: Abnormal   Collection Time: 09/13/15  8:11 AM  Result Value Ref Range   Cholesterol, Total 303 (H) 100 - 199 mg/dL   Triglycerides 156 (H) 0 - 149 mg/dL   HDL 53 >39 mg/dL   VLDL Cholesterol Cal 31 5 - 40 mg/dL   LDL Calculated 219 (H) 0 - 99 mg/dL   Chol/HDL Ratio 5.7 (H) 0.0 - 4.4 ratio units    Comment:                                   T. Chol/HDL Ratio                                             Men  Women  1/2 Avg.Risk  3.4    3.3                                   Avg.Risk  5.0    4.4                                2X Avg.Risk  9.6    7.1                                 3X Avg.Risk 23.4   11.0   CBC with Differential/Platelet     Status: None   Collection Time: 09/13/15  8:11 AM  Result Value Ref Range   WBC 9.6 3.4 - 10.8 x10E3/uL   RBC 4.82 3.77 - 5.28 x10E6/uL   Hemoglobin 14.1 11.1 - 15.9 g/dL   Hematocrit 41.6 34.0 - 46.6 %   MCV 86 79 - 97 fL   MCH 29.3 26.6 - 33.0 pg   MCHC 33.9 31.5 - 35.7 g/dL   RDW 13.8 12.3 - 15.4 %   Platelets 254 150 - 379 x10E3/uL   Neutrophils 68 %   Lymphs 24 %   Monocytes 6 %   Eos 2 %   Basos 0 %   Neutrophils Absolute 6.6 1.4 - 7.0 x10E3/uL   Lymphocytes Absolute 2.3 0.7 - 3.1 x10E3/uL   Monocytes Absolute 0.6 0.1 - 0.9 x10E3/uL   EOS (ABSOLUTE) 0.2 0.0 - 0.4 x10E3/uL   Basophils Absolute 0.0 0.0 - 0.2 x10E3/uL   Immature Granulocytes 0 %   Immature Grans (Abs) 0.0 0.0 - 0.1 x10E3/uL  Comment     Status: None   Collection Time: 09/13/15  8:11 AM  Result Value Ref Range   Comment Comment     Comment: According to ATP-III Guidelines, HDL-C >59 mg/dL is considered a negative risk factor for CHD.   Hgb A1c w/o eAG     Status: Abnormal   Collection Time: 09/13/15  8:11 AM  Result Value Ref Range   Hgb A1c MFr Bld 5.8 (H) 4.8 - 5.6 %    Comment:          Pre-diabetes: 5.7 - 6.4          Diabetes: >6.4          Glycemic control for adults with diabetes: <7.0   Specimen status report     Status: None   Collection Time: 09/13/15  8:11 AM  Result Value Ref Range   specimen status report Comment     Comment: Written Authorization Written Authorization Written Authorization Received. Authorization received from [place] 09-16-2015 Logged by Lenice Llamas      PHQ2/9: Depression screen [facility] 2/9 10/03/2015 09/15/2015 06/29/2015  Decreased Interest 0 0 0  Down, Depressed, Hopeless 0 0 0  PHQ - 2 Score 0 0 0    Fall Risk: Fall Risk  10/03/2015 09/15/2015 06/29/2015  Falls in the past year? No No Yes  Number falls in past yr: - - 1  Injury with Fall? - - Yes    Functional  Status Survey: Is the patient deaf or have difficulty hearing?: No Does the patient have difficulty seeing, even when wearing glasses/contacts?: No Does the patient have difficulty concentrating, remembering, or making decisions?: No Does the patient have difficulty  walking or climbing stairs?: No Does the patient have difficulty dressing or bathing?: No Does the patient have difficulty doing errands alone such as visiting a doctor's office or shopping?: No    Assessment & Plan  1. Dyslipidemia  She is doing well with dietary modifications, she is afraid of trying something else at this time. We will recheck labs next visit, stop otc fish oil, having enough in her diet  2. Overweight (BMI 25.0-29.9)  Doing great, lost weight   3. Metabolic syndrome  Continue diet and exercise

## 2015-11-09 ENCOUNTER — Encounter: Payer: Self-pay | Admitting: Internal Medicine

## 2015-11-09 ENCOUNTER — Ambulatory Visit (INDEPENDENT_AMBULATORY_CARE_PROVIDER_SITE_OTHER): Payer: Federal, State, Local not specified - PPO | Admitting: Internal Medicine

## 2015-11-09 ENCOUNTER — Telehealth: Payer: Self-pay | Admitting: *Deleted

## 2015-11-09 VITALS — BP 124/76 | HR 69 | Ht 66.0 in | Wt 164.2 lb

## 2015-11-09 DIAGNOSIS — R079 Chest pain, unspecified: Secondary | ICD-10-CM | POA: Diagnosis not present

## 2015-11-09 DIAGNOSIS — Z9189 Other specified personal risk factors, not elsewhere classified: Secondary | ICD-10-CM

## 2015-11-09 DIAGNOSIS — Z87898 Personal history of other specified conditions: Secondary | ICD-10-CM

## 2015-11-09 NOTE — Telephone Encounter (Signed)
Pt is having shoulder surgery and wanted to know what Dr Caryl Comes thought about it. She is not able to tell a lot about surgery but wanted to know if she can do it.  Please advise   Request for surgical clearance:  1. What type of surgery is being performed? Shoulder Surgery.    When is this surgery scheduled? Not yet   2. Are there any medications that need to be held prior to surgery and how long? Not sure   3. Name of physician performing surgery? [facility]     4. What is your office phone and fax number?

## 2015-11-09 NOTE — Progress Notes (Signed)
Patient Care Team: Steele Sizer, MD as PCP - General (Family Medicine)   HPI  Lori [name] is a 50 y.o. female Seen in followup for palpitations in the context of what I thought on evaluation December 2013 was dysautonomia. She had accompanying lightheadedness residual fatigue and chest discomfort.prior evaluation had also suggested that she had a "hole in the heart". Recent echocardiogram did not support that diagnosis. Event recorder previously had demonstrated PVCs. She has had a cold and has been using stimulants. She is complaining of a flutter in her heart with them Overall however, from an autonomic perspective she is feeling much better than she has in a long time. Apparently the citalopram has been beneficial anxiety and depression are much better  No significant lightheadedness.   Past Medical History  Diagnosis Date  . Cancer, uterine ([place]) 1998  . Chest pain     normal LV function by echo 10/2010  . Palpitations   . 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 High cholesterol   . Kidney stone     Past Surgical History  Procedure Laterality Date  . Wrist surgery      broken wrist,left  . Cholecystectomy    . Cesarean section      x2  . Appendectomy    . Surgery on face      AFTER FALL  . Patent foramen ovale closure    . Neurocardiogenic syncope      Current Outpatient Prescriptions  Medication Sig Dispense Refill  . aspirin 81 MG tablet Take 81 mg by mouth as needed for pain.    . B Complex Vitamins (VITAMIN B COMPLEX PO) Take 1 tablet by mouth daily.    . beclomethasone (QVAR) 40 MCG/ACT inhaler Inhale 2 puffs into the lungs 2 (two) times daily. 1 Inhaler 1  . Coenzyme Q10 (COQ10) 100 MG CAPS Take 1 tablet by mouth daily. 30 each 0  . fluticasone (FLONASE) 50 MCG/ACT nasal spray Place into the nose.    . Multiple Vitamin (MULTI-VITAMINS) TABS Take 1 tablet by mouth daily.    . Probiotic Product (PROBIOTIC ADVANCED PO) Take 1 each by mouth 1 day or 1 dose.       No current facility-administered medications for this visit.    Allergies  Allergen Reactions  . Atorvastatin Swelling  . Ciprofloxacin Hcl Other (See Comments)    Heart condition  Lowers blood pressure  . Cyclobenzaprine Other (See Comments)    Angina; lowers BP  . Nortriptyline Other (See Comments)    Angina, lowers BP  . Pravastatin Other (See Comments)    gingivitis  . Prednisone Other (See Comments)    Has heart condition causes BP to gt too low.  Francine Graven Hives    Review of Systems negative except from HPI and PMH  Physical Exam BP 124/76 mmHg  Pulse 69  Ht 5\' 6"  (1.676 m)  Wt 164 lb 4 oz (74.503 kg)  BMI 26.52 kg/m2 Well developed and well nourished in no acute distress HENT normal E scleral and icterus clear Neck Supple JVP flat; carotids brisk and full Clear to ausculation  *Regular rate and rhythm, no murmurs gallops or rub Soft with active bowel sounds No clubbing cyanosis  Edema Alert and oriented, grossly normal motor and sensory function Skin Warm and Dry  ECG demonstrates sinus rhythm at 74 Intervals 14/08/40  Assessment and  Plan  Dysautonomia  Palpitations  anxiety    She is doing as well as  I have seen her for some time. We will continue her current medications.  We continue to stress the importance of exercise and hydration  We did review the potential impact of her cold medicines on the palpitations. She is taking some stimulants. We've outlined that which she can actually she should not take

## 2015-11-09 NOTE — Telephone Encounter (Signed)
Forward to [name] and Nira Conn, RN

## 2015-11-09 NOTE — Patient Instructions (Signed)
Medication Instructions: - no changes  Labwork: - none  Procedures/Testing: - none  Follow-Up: - Your physician wants you to follow-up in: 6 months with Dr. Klein. You will receive a reminder letter in the mail two months in advance. If you don't receive a letter, please call our office to schedule the follow-up appointment.  Any Additional Special Instructions Will Be Listed Below (If Applicable).   

## 2015-11-13 NOTE — Telephone Encounter (Signed)
No reason that i know of that she cannot have surgery; it would be important for the anesthesiologist to know that she  Has dysautonomia

## 2015-11-14 NOTE — Telephone Encounter (Signed)
Spoke with pt and she is aware of Dr. Olin Pia recommendations. She is not scheduled for any surgery at this time and has not decided to have any surgery at this point. She appreciates his recommendations.

## 2015-11-14 NOTE — Telephone Encounter (Signed)
I attempted to call the patient back. No answer.  Will call back later.

## 2015-11-21 ENCOUNTER — Encounter: Payer: Self-pay | Admitting: Family Medicine

## 2015-11-21 ENCOUNTER — Ambulatory Visit (INDEPENDENT_AMBULATORY_CARE_PROVIDER_SITE_OTHER): Payer: Federal, State, Local not specified - PPO | Admitting: Family Medicine

## 2015-11-21 VITALS — BP 128/74 | HR 100 | Temp 97.9°F | Resp 14 | Ht 66.0 in | Wt 164.8 lb

## 2015-11-21 DIAGNOSIS — J020 Streptococcal pharyngitis: Secondary | ICD-10-CM | POA: Diagnosis not present

## 2015-11-21 LAB — POCT RAPID STREP A (OFFICE): RAPID STREP A SCREEN: POSITIVE — AB

## 2015-11-21 MED ORDER — FIRST-DUKES MOUTHWASH MT SUSP: 15.0000 mL | Freq: Three times a day (TID) | OROMUCOSAL | Status: DC

## 2015-11-21 MED ORDER — AMOXICILLIN 500 MG PO CAPS: 500.0000 mg | ORAL_CAPSULE | Freq: Three times a day (TID) | ORAL | Status: DC

## 2015-11-21 NOTE — Progress Notes (Signed)
Name: Lori [name]   MRN: [id number]    DOB: Apr 04, 1966   Date:11/21/2015       Progress Note  Subjective  Chief Complaint  Chief Complaint  Patient presents with  . URI    Onset-Saturday, bilateral ear pressure/pain, sinus pressure/pain, fatigue, chills, dry cough, dysphagia, sore throat. Has not taken anything at for symptoms at home.     HPI  Sore throat: she states that symptoms started gradually, five days ago. Initially with a sore throat, followed by dysphagia, chills, dry cough and pain radiates to both ears. No fever, no rashes or  headache. She has tried otc cold and flu medication with some improvement of symptoms. She denies rhinorrhea but she has some nasal congestion. She has body aches and mild nausea. She has been exposed to children with same symptoms.    Patient Active Problem List   Diagnosis Date Noted  . Metabolic syndrome 0000000  . Eustachian tube dysfunction 09/15/2015  . Asthma, mild intermittent, poorly controlled 09/15/2015  . Cervical spondylosis 08/29/2015  . Overweight (BMI 25.0-29.9) 06/29/2015  . IBS (irritable bowel syndrome) 06/29/2015  . Right flank pain 06/22/2015  . 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 Cervical radiculitis 07/19/2014  . 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 Dysautonomia 10/01/2012  . History of syncope 07/07/2012  . Depression with anxiety 06/11/2011  . Dyslipidemia 11/02/2010    Past Surgical History  Procedure Laterality Date  . Wrist surgery      broken wrist,left  . Cholecystectomy    . Cesarean section      x2  . Appendectomy    . Surgery on face      AFTER FALL  . Patent foramen ovale closure    . Neurocardiogenic syncope      Family History  Problem Relation Age of Onset  . Hypertension Mother   . Heart disease Father 54    MI    Social History   Social History  . Marital Status: Married    Spouse Name: N/A  . Number of Children: N/A  . Years of  Education: N/A   Occupational History  . Not on file.   Social History Main Topics  . Smoking status: Former Smoker -- 1.00 packs/day for 5 years    Types: Cigarettes    Quit date: 02/11/2006  . Smokeless tobacco: Never Used  . Alcohol Use: No  . Drug Use: No  . Sexual Activity:    Partners: Male   Other Topics Concern  . Not on file   Social History Narrative     Current outpatient prescriptions:  .  aspirin 81 MG tablet, Take 81 mg by mouth as needed for pain., Disp: , Rfl:  .  B Complex Vitamins (VITAMIN B COMPLEX PO), Take 1 tablet by mouth daily., Disp: , Rfl:  .  Coenzyme Q10 (COQ10) 100 MG CAPS, Take 1 tablet by mouth daily., Disp: 30 each, Rfl: 0 .  fluticasone (FLONASE) 50 MCG/ACT nasal spray, Place into the nose., Disp: , Rfl:  .  Multiple Vitamin (MULTI-VITAMINS) TABS, Take 1 tablet by mouth daily., Disp: , Rfl:  .  Probiotic Product (PROBIOTIC ADVANCED PO), Take 1 each by mouth 1 day or 1 dose., Disp: , Rfl:  .  amoxicillin (AMOXIL) 500 MG capsule, Take 1 capsule (500 mg total) by mouth 3 (three) times daily., Disp: 30 capsule, Rfl: 0 .  Diphenhyd-Hydrocort-Nystatin (FIRST-DUKES MOUTHWASH) SUSP, Use as directed 15 mLs in the mouth or  throat 4 (four) times daily -  before meals and at bedtime., Disp: 237 mL, Rfl: 0  Allergies  Allergen Reactions  . Atorvastatin Swelling  . Ciprofloxacin Hcl Other (See Comments)    Heart condition  Lowers blood pressure  . Cyclobenzaprine Other (See Comments)    Angina; lowers BP  . Nortriptyline Other (See Comments)    Angina, lowers BP  . Pravastatin Other (See Comments)    gingivitis  . Prednisone Other (See Comments)    Has heart condition causes BP to gt too low.  . Sulfamethizole Hives     ROS  Ten systems reviewed and is negative except as mentioned in HPI   Objective  Filed Vitals:   11/21/15 1541  BP: 128/74  Pulse: 100  Temp: 97.9 F (36.6 C)  TempSrc: Oral  Resp: 14  Height: 5\' 6"  (1.676 m)  Weight:  164 lb 12.8 oz (74.753 kg)  SpO2: 97%    Body mass index is 26.61 kg/(m^2).  Physical Exam  Constitutional: Patient appears well-developed and well-nourished. No distress.  HEENT: head atraumatic, normocephalic, pupils equal and reactive to light, ears normal bilaterally, neck supple, throat shows erythema, no exudate, no postnasal drainage, tender anterior lymphadenopathy Cardiovascular: Normal rate, regular rhythm and normal heart sounds.  No murmur heard. No BLE edema. Pulmonary/Chest: Effort normal and breath sounds normal. No respiratory distress. Abdominal: Soft.  There is no tenderness. Psychiatric: Patient has a normal mood and affect. behavior is normal. Judgment and thought content normal.  Recent Results (from the past 2160 hour(s))  Comprehensive metabolic panel     Status: Abnormal   Collection Time: 09/13/15  8:11 AM  Result Value Ref Range   Glucose 116 (H) 65 - 99 mg/dL   BUN 10 6 - 24 mg/dL   Creatinine, Ser 0.64 0.57 - 1.00 mg/dL   GFR calc non Af Amer 104 >59 mL/min/1.73   GFR calc Af Amer 120 >59 mL/min/1.73   BUN/Creatinine Ratio 16 9 - 23   Sodium 140 136 - 144 mmol/L   Potassium 4.6 3.5 - 5.2 mmol/L   Chloride 101 97 - 106 mmol/L   CO2 24 18 - 29 mmol/L   Calcium 9.0 8.7 - 10.2 mg/dL   Total Protein 6.9 6.0 - 8.5 g/dL   Albumin 4.3 3.5 - 5.5 g/dL   Globulin, Total 2.6 1.5 - 4.5 g/dL   Albumin/Globulin Ratio 1.7 1.1 - 2.5   Bilirubin Total 0.4 0.0 - 1.2 mg/dL   Alkaline Phosphatase 88 39 - 117 IU/L   AST 15 0 - 40 IU/L   ALT 23 0 - 32 IU/L  TSH     Status: None   Collection Time: 09/13/15  8:11 AM  Result Value Ref Range   TSH 1.470 0.450 - 4.500 uIU/mL  Lipid panel     Status: Abnormal   Collection Time: 09/13/15  8:11 AM  Result Value Ref Range   Cholesterol, Total 303 (H) 100 - 199 mg/dL   Triglycerides 156 (H) 0 - 149 mg/dL   HDL 53 >39 mg/dL   VLDL Cholesterol Cal 31 5 - 40 mg/dL   LDL Calculated 219 (H) 0 - 99 mg/dL   Chol/HDL Ratio 5.7  (H) 0.0 - 4.4 ratio units    Comment:                                   T. Chol/HDL Ratio  Men  Women                               1/2 Avg.Risk  3.4    3.3                                   Avg.Risk  5.0    4.4                                2X Avg.Risk  9.6    7.1                                3X Avg.Risk 23.4   11.0   CBC with Differential/Platelet     Status: None   Collection Time: 09/13/15  8:11 AM  Result Value Ref Range   WBC 9.6 3.4 - 10.8 x10E3/uL   RBC 4.82 3.77 - 5.28 x10E6/uL   Hemoglobin 14.1 11.1 - 15.9 g/dL   Hematocrit 41.6 34.0 - 46.6 %   MCV 86 79 - 97 fL   MCH 29.3 26.6 - 33.0 pg   MCHC 33.9 31.5 - 35.7 g/dL   RDW 13.8 12.3 - 15.4 %   Platelets 254 150 - 379 x10E3/uL   Neutrophils 68 %   Lymphs 24 %   Monocytes 6 %   Eos 2 %   Basos 0 %   Neutrophils Absolute 6.6 1.4 - 7.0 x10E3/uL   Lymphocytes Absolute 2.3 0.7 - 3.1 x10E3/uL   Monocytes Absolute 0.6 0.1 - 0.9 x10E3/uL   EOS (ABSOLUTE) 0.2 0.0 - 0.4 x10E3/uL   Basophils Absolute 0.0 0.0 - 0.2 x10E3/uL   Immature Granulocytes 0 %   Immature Grans (Abs) 0.0 0.0 - 0.1 x10E3/uL  Comment     Status: None   Collection Time: 09/13/15  8:11 AM  Result Value Ref Range   Comment Comment     Comment: According to ATP-III Guidelines, HDL-C >59 mg/dL is considered a negative risk factor for CHD.   Hgb A1c w/o eAG     Status: Abnormal   Collection Time: 09/13/15  8:11 AM  Result Value Ref Range   Hgb A1c MFr Bld 5.8 (H) 4.8 - 5.6 %    Comment:          Pre-diabetes: 5.7 - 6.4          Diabetes: >6.4          Glycemic control for adults with diabetes: <7.0   Specimen status report     Status: None   Collection Time: 09/13/15  8:11 AM  Result Value Ref Range   specimen status report Comment     Comment: Written Authorization Written Authorization Written Authorization Received. Authorization received from [place] 09-16-2015 Logged by Lenice Llamas    POCT rapid strep A     Status: Abnormal   Collection Time: 11/21/15  4:30 PM  Result Value Ref Range   Rapid Strep A Screen Positive (A) Negative    PHQ2/9: Depression screen [facility] 2/9 10/03/2015 09/15/2015 06/29/2015  Decreased Interest 0 0 0  Down, Depressed, Hopeless 0 0 0  PHQ - 2 Score 0 0 0     Fall Risk: Fall Risk  10/03/2015 09/15/2015 06/29/2015  Falls  in the past year? No No Yes  Number falls in past yr: - - 1  Injury with Fall? - - Yes       Assessment & Plan   1. Sore throat  - POCT rapid strep A - positive - Diphenhyd-Hydrocort-Nystatin (FIRST-DUKES MOUTHWASH) SUSP; Use as directed 15 mLs in the mouth or throat 4 (four) times daily -  before meals and at bedtime.  Dispense: 237 mL; Refill: 0 - amoxicillin (AMOXIL) 500 MG capsule; Take 1 capsule (500 mg total) by mouth 3 (three) times daily.  Dispense: 30 capsule; Refill: 0

## 2015-11-24 ENCOUNTER — Encounter: Payer: Federal, State, Local not specified - PPO | Admitting: Family Medicine

## 2015-11-28 ENCOUNTER — Other Ambulatory Visit: Payer: Self-pay | Admitting: Family Medicine

## 2015-11-28 NOTE — Telephone Encounter (Signed)
Patient requesting refill. 

## 2015-12-05 ENCOUNTER — Encounter: Payer: Self-pay | Admitting: Family Medicine

## 2015-12-13 ENCOUNTER — Encounter: Payer: Self-pay | Admitting: *Deleted

## 2015-12-13 ENCOUNTER — Emergency Department
Admission: EM | Admit: 2015-12-13 | Discharge: 2015-12-13 | Disposition: A | Discharge status: Home / Self Care | Payer: Federal, State, Local not specified - PPO | Attending: Emergency Medicine | Admitting: Emergency Medicine

## 2015-12-13 DIAGNOSIS — Y9289 Other specified places as the place of occurrence of the external cause: Secondary | ICD-10-CM | POA: Insufficient documentation

## 2015-12-13 DIAGNOSIS — R110 Nausea: Secondary | ICD-10-CM | POA: Diagnosis not present

## 2015-12-13 DIAGNOSIS — Y9389 Activity, other specified: Secondary | ICD-10-CM | POA: Diagnosis not present

## 2015-12-13 DIAGNOSIS — T370X1A Poisoning by sulfonamides, accidental (unintentional), initial encounter: Secondary | ICD-10-CM | POA: Insufficient documentation

## 2015-12-13 DIAGNOSIS — Y998 Other external cause status: Secondary | ICD-10-CM | POA: Diagnosis not present

## 2015-12-13 DIAGNOSIS — F419 Anxiety disorder, unspecified: Secondary | ICD-10-CM | POA: Insufficient documentation

## 2015-12-13 DIAGNOSIS — R531 Weakness: Secondary | ICD-10-CM | POA: Diagnosis not present

## 2015-12-13 DIAGNOSIS — T461X1A Poisoning by calcium-channel blockers, accidental (unintentional), initial encounter: Secondary | ICD-10-CM | POA: Diagnosis not present

## 2015-12-13 DIAGNOSIS — Z87891 Personal history of nicotine dependence: Secondary | ICD-10-CM | POA: Insufficient documentation

## 2015-12-13 DIAGNOSIS — R42 Dizziness and giddiness: Secondary | ICD-10-CM | POA: Diagnosis not present

## 2015-12-13 LAB — CBC WITH DIFFERENTIAL/PLATELET
Basophils Absolute: 0 10*3/uL (ref 0–0.1)
Basophils Relative: 0 %
EOS PCT: 2 %
Eosinophils Absolute: 0.1 10*3/uL (ref 0–0.7)
HEMATOCRIT: 40.6 % (ref 35.0–47.0)
Hemoglobin: 13.5 g/dL (ref 12.0–16.0)
LYMPHS ABS: 2 10*3/uL (ref 1.0–3.6)
LYMPHS PCT: 38 %
MCH: 28.6 pg (ref 26.0–34.0)
MCHC: 33.2 g/dL (ref 32.0–36.0)
MCV: 86.3 fL (ref 80.0–100.0)
MONO ABS: 0.4 10*3/uL (ref 0.2–0.9)
MONOS PCT: 7 %
NEUTROS ABS: 2.8 10*3/uL (ref 1.4–6.5)
Neutrophils Relative %: 53 %
PLATELETS: 184 10*3/uL (ref 150–440)
RBC: 4.71 MIL/uL (ref 3.80–5.20)
RDW: 13.5 % (ref 11.5–14.5)
WBC: 5.3 10*3/uL (ref 3.6–11.0)

## 2015-12-13 LAB — BASIC METABOLIC PANEL
Anion gap: 6 (ref 5–15)
BUN: 18 mg/dL (ref 6–20)
CALCIUM: 8.9 mg/dL (ref 8.9–10.3)
CO2: 29 mmol/L (ref 22–32)
Chloride: 107 mmol/L (ref 101–111)
Creatinine, Ser: 0.67 mg/dL (ref 0.44–1.00)
GFR calc Af Amer: >60 mL/min (ref >60)
GLUCOSE: 106 mg/dL — AB (ref 65–99)
Potassium: 4.4 mmol/L (ref 3.5–5.1)
Sodium: 142 mmol/L (ref 135–145)

## 2015-12-13 MED ORDER — ONDANSETRON HCL 4 MG/2ML IJ SOLN
4.0000 mg | Freq: Once | INTRAMUSCULAR | Status: DC
  Filled 2015-12-13: qty 2

## 2015-12-13 MED ORDER — SODIUM CHLORIDE 0.9 % IV SOLN
Freq: Once | INTRAVENOUS | Status: AC
Start: 2015-12-13 — End: 2015-12-13
  Administered 2015-12-13: 08:00:00 via INTRAVENOUS

## 2015-12-13 MED ORDER — ONDANSETRON HCL 4 MG/2ML IJ SOLN
4.0000 mg | Freq: Once | INTRAMUSCULAR | Status: AC
  Administered 2015-12-13: 4 mg via INTRAVENOUS

## 2015-12-13 NOTE — ED Provider Notes (Signed)
[facility] Emergency Department Provider Note     Time seen: ----------------------------------------- 7:44 AM on 12/13/2015 -----------------------------------------    I have reviewed the triage vital signs and the nursing notes.   HISTORY  Chief Complaint Allergic Reaction    HPI Lori [name] is a 50 y.o. female ER for possible adverse medication reaction. Patient states she got her medications mixed up with her husband and she took 40 mg of Norvasc and one Bactrim DS. Patient states she is allergic to sulfa drugs she has a history of hypotension and syncope. States she feels dizzy currently and nauseous. She denies other complaints this time. Patient ingested these medications proximal to 30 minutes ago.   Past Medical History  Diagnosis Date  . Cancer, uterine ([place]) 1998  . Chest pain     normal LV function by echo 10/2010  . Palpitations   . 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 High cholesterol   . Kidney stone     Patient Active Problem List   Diagnosis Date Noted  . Metabolic syndrome 0000000  . Eustachian tube dysfunction 09/15/2015  . Asthma, mild intermittent, poorly controlled 09/15/2015  . Cervical spondylosis 08/29/2015  . Overweight (BMI 25.0-29.9) 06/29/2015  . IBS (irritable bowel syndrome) 06/29/2015  . Right flank pain 06/22/2015  . 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 Cervical radiculitis 07/19/2014  . 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 Dysautonomia 10/01/2012  . History of syncope 07/07/2012  . Depression with anxiety 06/11/2011  . Dyslipidemia 11/02/2010    Past Surgical History  Procedure Laterality Date  . Wrist surgery      broken wrist,left  . Cholecystectomy    . Cesarean section      x2  . Appendectomy    . Surgery on face      AFTER FALL  . Patent foramen ovale closure    . Neurocardiogenic syncope      Allergies Atorvastatin; Ciprofloxacin hcl;  Cyclobenzaprine; Nortriptyline; Pravastatin; Prednisone; and Sulfamethizole  Social History Social History  Substance Use Topics  . Smoking status: Former Smoker -- 1.00 packs/day for 5 years    Types: Cigarettes    Quit date: 02/11/2006  . Smokeless tobacco: Never Used  . Alcohol Use: No    Review of Systems Constitutional: Negative for fever. Eyes: Negative for visual changes. ENT: Negative for sore throat. Cardiovascular: Negative for chest pain. Respiratory: Negative for shortness of breath. Gastrointestinal: Negative for abdominal pain, positive for nausea Genitourinary: Negative for dysuria. Musculoskeletal: Negative for back pain. Skin: Negative for rash. Neurological: Negative for headaches, positive for weakness  10-point ROS otherwise negative.  ____________________________________________   PHYSICAL EXAM:  VITAL SIGNS: ED Triage Vitals  Enc Vitals Group     BP 12/13/15 0736 141/73 mmHg     Pulse Rate 12/13/15 0736 71     Resp 12/13/15 0736 18     Temp 12/13/15 0736 97.6 F (36.4 C)     Temp Source 12/13/15 0736 Oral     SpO2 12/13/15 0736 97 %     Weight 12/13/15 0736 164 lb (74.39 kg)     Height 12/13/15 0736 5\' 6"  (1.676 m)     Head Cir --      Peak Flow --      Pain Score --      Pain Loc --      Pain Edu? --      Excl. in [place]? --     Constitutional: Alert and  oriented. Anxious but in no distress Eyes: Conjunctivae are normal. PERRL. Normal extraocular movements. ENT   Head: Normocephalic and atraumatic.   Nose: No congestion/rhinnorhea.   Mouth/Throat: Mucous membranes are moist.   Neck: No stridor. Cardiovascular: Normal rate, regular rhythm. Normal and symmetric distal pulses are present in all extremities. No murmurs, rubs, or gallops. Respiratory: Normal respiratory effort without tachypnea nor retractions. Breath sounds are clear and equal bilaterally. No wheezes/rales/rhonchi. Gastrointestinal: Soft and nontender. No  distention. No abdominal bruits.  Musculoskeletal: Nontender with normal range of motion in all extremities. No joint effusions.  No lower extremity tenderness nor edema. Neurologic:  Normal speech and language. No gross focal neurologic deficits are appreciated. Speech is normal. No gait instability. Skin:  Skin is warm, dry and intact. No rash noted. Psychiatric: Mood and affect are normal. Speech and behavior are normal. Patient exhibits appropriate insight and judgment. ____________________________________________  EKG: Interpreted by me. Normal sinus rhythm with a rate of 58 bpm, normal PR interval, normal QRS, normal QT interval. Normal axis.  ____________________________________________  ED COURSE:  Pertinent labs & imaging results that were available during my care of the patient were reviewed by me and considered in my medical decision making (see chart for details). Patient is no acute distress, appears anxious currently. We will verify the doses of medications that she took. ____________________________________________    LABS (pertinent positives/negatives)  Labs Reviewed  BASIC METABOLIC PANEL - Abnormal; Notable for the following:    Glucose, Bld 106 (*)    All other components within normal limits  CBC WITH DIFFERENTIAL/PLATELET   ____________________________________________  FINAL ASSESSMENT AND PLAN  Dizziness, nausea  Plan: Patient with labs and imaging as dictated above. Patient's labs are unremarkable. She is given a liter saline as well as Ativan and Zofran. Currently blood pressure is unremarkable even with standing. She is stable for outpatient follow-up with her doctor as needed.   Earleen Newport, MD   Earleen Newport, MD 12/13/15 1006

## 2015-12-13 NOTE — ED Notes (Signed)
States she got her medications mixed up with her husband and she took norvasc 40mg  and one sulfamethoxazole, states allergy to sulfa drugs and hx of hypotension and syncope, pt states she feels dizzy

## 2015-12-13 NOTE — Discharge Instructions (Signed)
Mareos (Dizziness) Los mareos son un problema muy frecuente. Se trata de una sensacin de inestabilidad o de desvanecimiento. Puede sentir que se va a desmayar. Un mareo puede provocarle una lesin si se tropieza o se cae. Las personas de todas las edades pueden sufrir mareos, pero es ms frecuente en los adultos mayores. Esta afeccin puede tener muchas causas, entre las que se pueden mencionar los medicamentos, la deshidratacin y las enfermedades. INSTRUCCIONES PARA EL CUIDADO EN EL HOGAR Estas indicaciones pueden ayudarlo con el trastorno: Comida y bebida  Beba suficiente lquido para mantener la orina clara o de color amarillo plido. Esto evita la deshidratacin. Trate de beber ms lquidos transparentes, como agua.  No beba alcohol.  Limite el consumo de cafena si el mdico se lo indica.  Limite el consumo de sal si el mdico se lo indica. Actividad  Evite los movimientos rpidos.  Levntese de las sillas con lentitud y apyese hasta sentirse bien.  Por la maAyza, sintese primero a un lado de la cama. Cuando se sienta bien, pngase lentamente de pie mientras se sostiene de algo, hasta que sepa que ha logrado el equilibrio.  Mueva las piernas con frecuencia si debe estar de pie en un lugar durante mucho tiempo. Mientras est de pie, contraiga y relaje los msculos de las piernas.  No conduzca vehculos ni opere maquinaria pesada si se siente mareado.  Evite agacharse si se siente mareado. En su casa, coloque los objetos de modo que le resulte fcil alcanzarlos sin agacharse. Estilo de vida  No consuma ningn producto que contenga tabaco, lo que incluye cigarrillos, tabaco de mascar o cigarrillos electrnicos. Si necesita ayuda para dejar de fumar, consulte al mdico.  Trate de reducir el nivel de estrs practicando actividades como el yoga o la meditacin. Hable con el mdico si necesita ayuda. Instrucciones generales  Controle sus mareos para ver si hay cambios.  Tome los  medicamentos solamente como se lo haya indicado el mdico. Hable con el mdico si cree que los medicamentos que est tomando son la causa de sus mareos.  Infrmele a un amigo o a un familiar si se siente mareado. Pdale a esta persona que llame al mdico si observa cambios en su comportamiento.  Concurra a todas las visitas de control como se lo haya indicado el mdico. Esto es importante. SOLICITE ATENCIN MDICA SI:  Los mareos persisten.  Los mareos o la sensacin de desvanecimiento empeoran.  Siente nuseas.  Ha perdido la audicin.  Aparecen nuevos sntomas.  Cuando est de pie se siente inestable o que la habitacin da vueltas. SOLICITE ATENCIN MDICA DE INMEDIATO SI:  Vomita o tiene diarrea y no puede comer ni beber nada.  Tiene dificultad para hablar, para caminar, para tragar o para usar los brazos, las manos o las piernas.  Siente una debilidad generalizada.  No piensa con claridad o tiene dificultades para armar oraciones. Es posible que un amigo o un familiar adviertan que esto ocurre.  Tiene dolor de pecho, dolor abdominal, sudoracin o le falta el aire.  Hay cambios en la visin.  Observa un sangrado.  Tiene dolores de cabeza.  Tiene dolor o rigidez en el cuello.  Tiene fiebre.   Esta informacin no tiene como fin reemplazar el consejo del mdico. Asegrese de hacerle al mdico cualquier pregunta que tenga.   Document Released: 10/14/2005 Document Revised: 02/28/2015 Elsevier Interactive Patient Education 2016 [employer]  

## 2015-12-18 ENCOUNTER — Ambulatory Visit (INDEPENDENT_AMBULATORY_CARE_PROVIDER_SITE_OTHER): Payer: Federal, State, Local not specified - PPO | Admitting: Family Medicine

## 2015-12-18 ENCOUNTER — Encounter: Payer: Self-pay | Admitting: Family Medicine

## 2015-12-18 VITALS — BP 118/70 | HR 74 | Temp 97.7°F | Resp 16 | Wt 160.5 lb

## 2015-12-18 DIAGNOSIS — I8393 Asymptomatic varicose veins of bilateral lower extremities: Secondary | ICD-10-CM

## 2015-12-18 DIAGNOSIS — J309 Allergic rhinitis, unspecified: Secondary | ICD-10-CM

## 2015-12-18 DIAGNOSIS — H6983 Other specified disorders of Eustachian tube, bilateral: Secondary | ICD-10-CM | POA: Diagnosis not present

## 2015-12-18 DIAGNOSIS — J3089 Other allergic rhinitis: Secondary | ICD-10-CM

## 2015-12-18 MED ORDER — MONTELUKAST SODIUM 10 MG PO TABS: 10.0000 mg | ORAL_TABLET | Freq: Every day | ORAL | Status: DC

## 2015-12-18 MED ORDER — LORATADINE 10 MG PO TABS: 10.0000 mg | ORAL_TABLET | Freq: Two times a day (BID) | ORAL | Status: DC

## 2015-12-18 NOTE — Progress Notes (Signed)
Name: Lori [name]   MRN: [id number]    DOB: 1966-03-16   Date:12/18/2015       Progress Note  Subjective  Chief Complaint  Chief Complaint  Patient presents with  . Follow-up    patient is here for her 52-monthf/u  . Allergic Rhinitis     patient stated for the past 10 days she has been bothered with lots of allergies  . Referral    ENT    HPI  Allergic Rhinitis: she has ear round symptoms, worse this time of the year. She has ear fullness, rhinorrhea, nasal congestion, post-nasal drainage, pruritus on both eyes and sometimes dryness, and sandy feeling in her eyes. No rashes. She is using her nasal steroid without improvement, seen by ENT in the past. We will start oral medications.   Varicose Veins: she states that for many years she has leg pains, worse when she gains weight , she has varicose veins, they swell at the end of the day. Seen by a vascular surgeon in the past and would like to go back for further evaluation.    Patient Active Problem List   Diagnosis Date Noted  . Metabolic syndrome 179/11/4095 . Eustachian tube dysfunction 09/15/2015  . Asthma, mild intermittent, poorly controlled 09/15/2015  . Cervical spondylosis 08/29/2015  . Overweight (BMI 25.0-29.9) 06/29/2015  . IBS (irritable bowel syndrome) 06/29/2015  . Right flank pain 06/22/2015  . 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 Cervical radiculitis 07/19/2014  . 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 Dysautonomia 10/01/2012  . History of syncope 07/07/2012  . Depression with anxiety 06/11/2011  . Dyslipidemia 11/02/2010    Past Surgical History  Procedure Laterality Date  . Wrist surgery      broken wrist,left  . Cholecystectomy    . Cesarean section      x2  . Appendectomy    . Surgery on face      AFTER FALL  . Patent foramen ovale closure    . Neurocardiogenic syncope      Family History  Problem Relation Age of Onset  . Hypertension Mother   .  Heart disease Father 635   MI    Social History   Social History  . Marital Status: Married    Spouse Name: N/A  . Number of Children: N/A  . Years of Education: N/A   Occupational History  . Not on file.   Social History Main Topics  . Smoking status: Former Smoker -- 1.00 packs/day for 5 years    Types: Cigarettes    Quit date: 02/11/2006  . Smokeless tobacco: Never Used  . Alcohol Use: No  . Drug Use: No  . Sexual Activity:    Partners: Male   Other Topics Concern  . Not on file   Social History Narrative     Current outpatient prescriptions:  .  aspirin 81 MG tablet, Take 81 mg by mouth as needed for pain., Disp: , Rfl:  .  B Complex Vitamins (VITAMIN B COMPLEX PO), Take 1 tablet by mouth daily., Disp: , Rfl:  .  Coenzyme Q10 (COQ10) 100 MG CAPS, Take 1 tablet by mouth daily., Disp: 30 each, Rfl: 0 .  fluticasone (FLONASE) 50 MCG/ACT nasal spray, Place into the nose., Disp: , Rfl:  .  loratadine (CLARITIN) 10 MG tablet, Take 1 tablet (10 mg total) by mouth 2 (two) times daily., Disp: 60 tablet, Rfl: 2 .  montelukast (SINGULAIR) 10 MG  tablet, Take 1 tablet (10 mg total) by mouth at bedtime., Disp: 30 tablet, Rfl: 2 .  Multiple Vitamin (MULTI-VITAMINS) TABS, Take 1 tablet by mouth daily., Disp: , Rfl:  .  omeprazole (PRILOSEC) 40 MG capsule, TAKE ONE PILL ONCE DAILY, 30 MINUTES PRIOR TO EVENING MEAL., Disp: , Rfl: 12 .  Probiotic Product (PROBIOTIC ADVANCED PO), Take 1 each by mouth 1 day or 1 dose., Disp: , Rfl:   Allergies  Allergen Reactions  . Atorvastatin Swelling  . Ciprofloxacin Hcl Other (See Comments)    Heart condition  Lowers blood pressure  . Cyclobenzaprine Other (See Comments)    Angina; lowers BP  . Nortriptyline Other (See Comments)    Angina, lowers BP  . Pravastatin Other (See Comments)    gingivitis  . Prednisone Other (See Comments)    Has heart condition causes BP to gt too low.  . Sulfamethizole Hives     ROS  Ten systems reviewed  and is negative except as mentioned in HPI   Objective  Filed Vitals:   12/18/15 0935  BP: 118/70  Pulse: 74  Temp: 97.7 F (36.5 C)  TempSrc: Oral  Resp: 16  Weight: 160 lb 8 oz (72.802 kg)  SpO2: 98%    Body mass index is 25.92 kg/(m^2).  Physical Exam  Constitutional: Patient appears well-developed and well-nourished. No distress.  HEENT: head atraumatic, normocephalic, pupils equal and reactive to light, ears TM normal bilateral, neck supple, throat within normal limits, boggy turbinates Cardiovascular: Normal rate, regular rhythm and normal heart sounds.  No murmur heard. No BLE edema.Spider veins and a large vein on left lower extremitiy Pulmonary/Chest: Effort normal and breath sounds normal. No respiratory distress. Abdominal: Soft.  There is no tenderness. Psychiatric: Patient has a normal mood and affect. behavior is normal. Judgment and thought content normal.  Recent Results (from the past 2160 hour(s))  POCT rapid strep A     Status: Abnormal   Collection Time: 11/21/15  4:30 PM  Result Value Ref Range   Rapid Strep A Screen Positive (A) Negative  CBC with Differential/Platelet     Status: None   Collection Time: 12/13/15  8:15 AM  Result Value Ref Range   WBC 5.3 3.6 - 11.0 K/uL   RBC 4.71 3.80 - 5.20 MIL/uL   Hemoglobin 13.5 12.0 - 16.0 g/dL   HCT 40.6 35.0 - 47.0 %   MCV 86.3 80.0 - 100.0 fL   MCH 28.6 26.0 - 34.0 pg   MCHC 33.2 32.0 - 36.0 g/dL   RDW 13.5 11.5 - 14.5 %   Platelets 184 150 - 440 K/uL   Neutrophils Relative % 53 %   Neutro Abs 2.8 1.4 - 6.5 K/uL   Lymphocytes Relative 38 %   Lymphs Abs 2.0 1.0 - 3.6 K/uL   Monocytes Relative 7 %   Monocytes Absolute 0.4 0.2 - 0.9 K/uL   Eosinophils Relative 2 %   Eosinophils Absolute 0.1 0 - 0.7 K/uL   Basophils Relative 0 %   Basophils Absolute 0.0 0 - 0.1 K/uL  Basic metabolic panel     Status: Abnormal   Collection Time: 12/13/15  8:15 AM  Result Value Ref Range   Sodium 142 135 - 145 mmol/L    Potassium 4.4 3.5 - 5.1 mmol/L   Chloride 107 101 - 111 mmol/L   CO2 29 22 - 32 mmol/L   Glucose, Bld 106 (H) 65 - 99 mg/dL   BUN 18 6 - 20  mg/dL   Creatinine, Ser 0.67 0.44 - 1.00 mg/dL   Calcium 8.9 8.9 - 10.3 mg/dL   GFR calc non Af Amer >60 >60 mL/min   GFR calc Af Amer >60 >60 mL/min    Comment: (NOTE) The eGFR has been calculated using the CKD EPI equation. This calculation has not been validated in all clinical situations. eGFR's persistently <60 mL/min signify possible Chronic Kidney Disease.    Anion gap 6 5 - 15     PHQ2/9: Depression screen [facility] 2/9 12/18/2015 10/03/2015 09/15/2015 06/29/2015  Decreased Interest 0 0 0 0  Down, Depressed, Hopeless 0 0 0 0  PHQ - 2 Score 0 0 0 0     Fall Risk: Fall Risk  12/18/2015 10/03/2015 09/15/2015 06/29/2015  Falls in the past year? No No No Yes  Number falls in past yr: - - - 1  Injury with Fall? - - - Yes     Functional Status Survey: Is the patient deaf or have difficulty hearing?: No Does the patient have difficulty seeing, even when wearing glasses/contacts?: No Does the patient have difficulty concentrating, remembering, or making decisions?: No Does the patient have difficulty walking or climbing stairs?: No Does the patient have difficulty dressing or bathing?: No Does the patient have difficulty doing errands alone such as visiting a doctor's office or shopping?: No   Assessment & Plan  1. Perennial allergic rhinitis  We will try adding oral medication  - loratadine (CLARITIN) 10 MG tablet; Take 1 tablet (10 mg total) by mouth 2 (two) times daily.  Dispense: 60 tablet; Refill: 2 - montelukast (SINGULAIR) 10 MG tablet; Take 1 tablet (10 mg total) by mouth at bedtime.  Dispense: 30 tablet; Refill: 2  2. Eustachian tube dysfunction, bilateral  Continue nasal spray    3. Varicose veins of both lower extremities  - Ambulatory referral to Vascular Surgery

## 2016-02-22 ENCOUNTER — Encounter: Payer: Self-pay | Admitting: Family Medicine

## 2016-02-22 DIAGNOSIS — H8110 Benign paroxysmal vertigo, unspecified ear: Secondary | ICD-10-CM | POA: Insufficient documentation

## 2016-02-22 DIAGNOSIS — J3089 Other allergic rhinitis: Secondary | ICD-10-CM | POA: Insufficient documentation

## 2016-03-26 ENCOUNTER — Other Ambulatory Visit: Payer: Self-pay | Admitting: Gynecology

## 2016-03-26 DIAGNOSIS — R102 Pelvic and perineal pain: Secondary | ICD-10-CM

## 2016-03-27 ENCOUNTER — Ambulatory Visit
Admission: RE | Admit: 2016-03-27 | Discharge: 2016-03-27 | Disposition: A | Discharge status: Home / Self Care | Payer: Federal, State, Local not specified - PPO | Source: Ambulatory Visit | Attending: Gynecology | Admitting: Gynecology

## 2016-03-27 DIAGNOSIS — N854 Malposition of uterus: Secondary | ICD-10-CM | POA: Diagnosis not present

## 2016-03-27 DIAGNOSIS — R102 Pelvic and perineal pain: Secondary | ICD-10-CM | POA: Insufficient documentation

## 2016-04-06 ENCOUNTER — Emergency Department: Payer: Federal, State, Local not specified - PPO

## 2016-04-06 ENCOUNTER — Observation Stay
Admission: EM | Admit: 2016-04-06 | Discharge: 2016-04-08 | Disposition: A | Discharge status: Home / Self Care | Payer: Federal, State, Local not specified - PPO | Attending: Internal Medicine | Admitting: Internal Medicine

## 2016-04-06 ENCOUNTER — Encounter: Payer: Self-pay | Admitting: Emergency Medicine

## 2016-04-06 ENCOUNTER — Emergency Department
Admission: EM | Admit: 2016-04-06 | Discharge: 2016-04-06 | Disposition: A | Discharge status: Home / Self Care | Payer: Federal, State, Local not specified - PPO | Source: Home / Self Care | Attending: Emergency Medicine | Admitting: Emergency Medicine

## 2016-04-06 DIAGNOSIS — Z7982 Long term (current) use of aspirin: Secondary | ICD-10-CM | POA: Diagnosis not present

## 2016-04-06 DIAGNOSIS — Z881 Allergy status to other antibiotic agents status: Secondary | ICD-10-CM | POA: Insufficient documentation

## 2016-04-06 DIAGNOSIS — Z9049 Acquired absence of other specified parts of digestive tract: Secondary | ICD-10-CM | POA: Insufficient documentation

## 2016-04-06 DIAGNOSIS — Z87442 Personal history of urinary calculi: Secondary | ICD-10-CM | POA: Insufficient documentation

## 2016-04-06 DIAGNOSIS — E7800 Pure hypercholesterolemia, unspecified: Secondary | ICD-10-CM | POA: Insufficient documentation

## 2016-04-06 DIAGNOSIS — R52 Pain, unspecified: Secondary | ICD-10-CM

## 2016-04-06 DIAGNOSIS — F329 Major depressive disorder, single episode, unspecified: Secondary | ICD-10-CM | POA: Diagnosis not present

## 2016-04-06 DIAGNOSIS — F418 Other specified anxiety disorders: Secondary | ICD-10-CM | POA: Diagnosis not present

## 2016-04-06 DIAGNOSIS — J45909 Unspecified asthma, uncomplicated: Secondary | ICD-10-CM

## 2016-04-06 DIAGNOSIS — Z8249 Family history of ischemic heart disease and other diseases of the circulatory system: Secondary | ICD-10-CM | POA: Diagnosis not present

## 2016-04-06 DIAGNOSIS — S022XXA Fracture of nasal bones, initial encounter for closed fracture: Secondary | ICD-10-CM | POA: Insufficient documentation

## 2016-04-06 DIAGNOSIS — X58XXXS Exposure to other specified factors, sequela: Secondary | ICD-10-CM | POA: Diagnosis not present

## 2016-04-06 DIAGNOSIS — H8110 Benign paroxysmal vertigo, unspecified ear: Secondary | ICD-10-CM | POA: Insufficient documentation

## 2016-04-06 DIAGNOSIS — Z79899 Other long term (current) drug therapy: Secondary | ICD-10-CM | POA: Insufficient documentation

## 2016-04-06 DIAGNOSIS — J4520 Mild intermittent asthma, uncomplicated: Secondary | ICD-10-CM | POA: Insufficient documentation

## 2016-04-06 DIAGNOSIS — Z8542 Personal history of malignant neoplasm of other parts of uterus: Secondary | ICD-10-CM | POA: Insufficient documentation

## 2016-04-06 DIAGNOSIS — Z882 Allergy status to sulfonamides status: Secondary | ICD-10-CM | POA: Diagnosis not present

## 2016-04-06 DIAGNOSIS — Z888 Allergy status to other drugs, medicaments and biological substances status: Secondary | ICD-10-CM | POA: Diagnosis not present

## 2016-04-06 DIAGNOSIS — L03211 Cellulitis of face: Principal | ICD-10-CM | POA: Insufficient documentation

## 2016-04-06 DIAGNOSIS — Z87891 Personal history of nicotine dependence: Secondary | ICD-10-CM

## 2016-04-06 DIAGNOSIS — N135 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: Secondary | ICD-10-CM | POA: Insufficient documentation

## 2016-04-06 DIAGNOSIS — R002 Palpitations: Secondary | ICD-10-CM | POA: Insufficient documentation

## 2016-04-06 DIAGNOSIS — R51 Headache: Secondary | ICD-10-CM | POA: Insufficient documentation

## 2016-04-06 DIAGNOSIS — K589 Irritable bowel syndrome without diarrhea: Secondary | ICD-10-CM | POA: Insufficient documentation

## 2016-04-06 DIAGNOSIS — E8881 Metabolic syndrome: Secondary | ICD-10-CM | POA: Diagnosis not present

## 2016-04-06 DIAGNOSIS — G901 Familial dysautonomia [Riley-Day]: Secondary | ICD-10-CM | POA: Diagnosis not present

## 2016-04-06 DIAGNOSIS — K047 Periapical abscess without sinus: Secondary | ICD-10-CM | POA: Insufficient documentation

## 2016-04-06 DIAGNOSIS — I442 Atrioventricular block, complete: Secondary | ICD-10-CM | POA: Diagnosis not present

## 2016-04-06 DIAGNOSIS — M542 Cervicalgia: Secondary | ICD-10-CM | POA: Diagnosis not present

## 2016-04-06 HISTORY — DX: Unspecified asthma, uncomplicated: J45.909

## 2016-04-06 LAB — COMPREHENSIVE METABOLIC PANEL
ALBUMIN: 4.3 g/dL (ref 3.5–5.0)
ALT: 75 U/L — ABNORMAL HIGH (ref 14–54)
ANION GAP: 9 (ref 5–15)
AST: 69 U/L — ABNORMAL HIGH (ref 15–41)
Alkaline Phosphatase: 100 U/L (ref 38–126)
BUN: 11 mg/dL (ref 6–20)
CALCIUM: 9.3 mg/dL (ref 8.9–10.3)
CHLORIDE: 103 mmol/L (ref 101–111)
CO2: 28 mmol/L (ref 22–32)
Creatinine, Ser: 0.64 mg/dL (ref 0.44–1.00)
GFR calc non Af Amer: >60 mL/min (ref >60)
Glucose, Bld: 91 mg/dL (ref 65–99)
POTASSIUM: 4 mmol/L (ref 3.5–5.1)
SODIUM: 140 mmol/L (ref 135–145)
Total Bilirubin: 1 mg/dL (ref 0.3–1.2)
Total Protein: 7.3 g/dL (ref 6.5–8.1)

## 2016-04-06 LAB — CBC WITH DIFFERENTIAL/PLATELET
BASOS PCT: 0 %
Basophils Absolute: 0 10*3/uL (ref 0–0.1)
Eosinophils Absolute: 0.1 10*3/uL (ref 0–0.7)
Eosinophils Relative: 1 %
HEMATOCRIT: 39.8 % (ref 35.0–47.0)
HEMOGLOBIN: 13.4 g/dL (ref 12.0–16.0)
LYMPHS ABS: 2 10*3/uL (ref 1.0–3.6)
LYMPHS PCT: 24 %
MCH: 28.5 pg (ref 26.0–34.0)
MCHC: 33.7 g/dL (ref 32.0–36.0)
MCV: 84.5 fL (ref 80.0–100.0)
MONOS PCT: 6 %
Monocytes Absolute: 0.5 10*3/uL (ref 0.2–0.9)
NEUTROS ABS: 5.8 10*3/uL (ref 1.4–6.5)
NEUTROS PCT: 69 %
Platelets: 200 10*3/uL (ref 150–440)
RBC: 4.71 MIL/uL (ref 3.80–5.20)
RDW: 13.6 % (ref 11.5–14.5)
WBC: 8.4 10*3/uL (ref 3.6–11.0)

## 2016-04-06 MED ORDER — CLINDAMYCIN HCL 150 MG PO CAPS
300.0000 mg | ORAL_CAPSULE | Freq: Once | ORAL | Status: AC
  Administered 2016-04-06: 300 mg via ORAL
  Filled 2016-04-06: qty 2

## 2016-04-06 MED ORDER — ACETAMINOPHEN 325 MG PO TABS: 650.0000 mg | ORAL_TABLET | Freq: Four times a day (QID) | ORAL | Status: DC | PRN

## 2016-04-06 MED ORDER — SODIUM CHLORIDE 0.45 % IV SOLN
INTRAVENOUS | Status: DC
  Administered 2016-04-06 – 2016-04-07 (×4): via INTRAVENOUS

## 2016-04-06 MED ORDER — PANTOPRAZOLE SODIUM 40 MG PO TBEC
40.0000 mg | DELAYED_RELEASE_TABLET | Freq: Every day | ORAL | Status: DC
  Administered 2016-04-06 – 2016-04-07 (×2): 40 mg via ORAL
  Filled 2016-04-06 (×2): qty 1

## 2016-04-06 MED ORDER — ADULT MULTIVITAMIN W/MINERALS CH
1.0000 | ORAL_TABLET | Freq: Every day | ORAL | Status: DC
  Administered 2016-04-07: 10:00:00 1 via ORAL
  Filled 2016-04-06 (×2): qty 1

## 2016-04-06 MED ORDER — ONDANSETRON HCL 4 MG/2ML IJ SOLN
4.0000 mg | Freq: Once | INTRAMUSCULAR | Status: AC
  Administered 2016-04-06: 4 mg via INTRAVENOUS
  Filled 2016-04-06: qty 2

## 2016-04-06 MED ORDER — FLUTICASONE PROPIONATE 50 MCG/ACT NA SUSP
2.0000 | Freq: Every day | NASAL | Status: DC
  Filled 2016-04-06: qty 16

## 2016-04-06 MED ORDER — LORATADINE 10 MG PO TABS
10.0000 mg | ORAL_TABLET | Freq: Two times a day (BID) | ORAL | Status: DC
  Administered 2016-04-06 – 2016-04-07 (×3): 10 mg via ORAL
  Filled 2016-04-06 (×3): qty 1

## 2016-04-06 MED ORDER — IOPAMIDOL (ISOVUE-300) INJECTION 61%
75.0000 mL | Freq: Once | INTRAVENOUS | Status: AC | PRN
  Administered 2016-04-06: 75 mL via INTRAVENOUS
  Filled 2016-04-06: qty 75

## 2016-04-06 MED ORDER — ONDANSETRON HCL 4 MG PO TABS: 4.0000 mg | ORAL_TABLET | Freq: Four times a day (QID) | ORAL | Status: DC | PRN

## 2016-04-06 MED ORDER — MONTELUKAST SODIUM 10 MG PO TABS
10.0000 mg | ORAL_TABLET | Freq: Every day | ORAL | Status: DC
  Administered 2016-04-06 – 2016-04-07 (×2): 10 mg via ORAL
  Filled 2016-04-06 (×2): qty 1

## 2016-04-06 MED ORDER — HEPARIN SODIUM (PORCINE) 5000 UNIT/ML IJ SOLN
5000.0000 [IU] | Freq: Three times a day (TID) | INTRAMUSCULAR | Status: DC
  Administered 2016-04-07: 21:00:00 5000 [IU] via SUBCUTANEOUS
  Filled 2016-04-06 (×3): qty 1

## 2016-04-06 MED ORDER — PIPERACILLIN-TAZOBACTAM 3.375 G IVPB
INTRAVENOUS | Status: AC
  Administered 2016-04-06: 3.375 g via INTRAVENOUS
  Filled 2016-04-06: qty 50

## 2016-04-06 MED ORDER — ACETAMINOPHEN 650 MG RE SUPP: 650.0000 mg | Freq: Four times a day (QID) | RECTAL | Status: DC | PRN

## 2016-04-06 MED ORDER — CLINDAMYCIN HCL 300 MG PO CAPS: 300.0000 mg | ORAL_CAPSULE | Freq: Four times a day (QID) | ORAL | Status: DC

## 2016-04-06 MED ORDER — ASPIRIN EC 81 MG PO TBEC: 81.0000 mg | DELAYED_RELEASE_TABLET | ORAL | Status: DC | PRN

## 2016-04-06 MED ORDER — CLINDAMYCIN PHOSPHATE 600 MG/50ML IV SOLN
600.0000 mg | Freq: Once | INTRAVENOUS | Status: AC
  Administered 2016-04-06: 600 mg via INTRAVENOUS
  Filled 2016-04-06: qty 50

## 2016-04-06 MED ORDER — OXYCODONE-ACETAMINOPHEN 5-325 MG PO TABS
1.0000 | ORAL_TABLET | Freq: Four times a day (QID) | ORAL | Status: DC | PRN
  Administered 2016-04-07 – 2016-04-08 (×3): 2 via ORAL
  Filled 2016-04-06 (×4): qty 2

## 2016-04-06 MED ORDER — DOCUSATE SODIUM 100 MG PO CAPS
100.0000 mg | ORAL_CAPSULE | Freq: Two times a day (BID) | ORAL | Status: DC
  Administered 2016-04-07: 100 mg via ORAL
  Filled 2016-04-06: qty 1

## 2016-04-06 MED ORDER — MORPHINE SULFATE (PF) 4 MG/ML IV SOLN
4.0000 mg | Freq: Once | INTRAVENOUS | Status: AC
  Administered 2016-04-06: 4 mg via INTRAVENOUS
  Filled 2016-04-06: qty 1

## 2016-04-06 MED ORDER — MORPHINE SULFATE (PF) 4 MG/ML IV SOLN
INTRAVENOUS | Status: AC
Start: 2016-04-06 — End: 2016-04-07
  Filled 2016-04-06: qty 1

## 2016-04-06 MED ORDER — SODIUM CHLORIDE 0.9 % IV BOLUS (SEPSIS)
1000.0000 mL | Freq: Once | INTRAVENOUS | Status: AC
  Administered 2016-04-06: 1000 mL via INTRAVENOUS

## 2016-04-06 MED ORDER — BISACODYL 10 MG RE SUPP: 10.0000 mg | Freq: Every day | RECTAL | Status: DC | PRN

## 2016-04-06 MED ORDER — OXYCODONE-ACETAMINOPHEN 5-325 MG PO TABS: 1.0000 | ORAL_TABLET | Freq: Four times a day (QID) | ORAL | Status: DC | PRN

## 2016-04-06 MED ORDER — LIDOCAINE VISCOUS 2 % MT SOLN
15.0000 mL | OROMUCOSAL | Status: DC | PRN
  Administered 2016-04-06 – 2016-04-08 (×5): 15 mL via OROMUCOSAL
  Filled 2016-04-06 (×6): qty 15

## 2016-04-06 MED ORDER — HYDROMORPHONE HCL 1 MG/ML IJ SOLN
1.0000 mg | INTRAMUSCULAR | Status: DC | PRN
  Administered 2016-04-06 – 2016-04-08 (×9): 1 mg via INTRAVENOUS
  Filled 2016-04-06 (×9): qty 1

## 2016-04-06 MED ORDER — MORPHINE SULFATE (PF) 4 MG/ML IV SOLN
4.0000 mg | Freq: Once | INTRAVENOUS | Status: AC
  Administered 2016-04-06: 4 mg via INTRAVENOUS

## 2016-04-06 MED ORDER — PIPERACILLIN-TAZOBACTAM 3.375 G IVPB
3.3750 g | Freq: Three times a day (TID) | INTRAVENOUS | Status: DC
  Administered 2016-04-06 – 2016-04-07 (×5): 3.375 g via INTRAVENOUS
  Filled 2016-04-06 (×6): qty 50

## 2016-04-06 MED ORDER — ONDANSETRON HCL 4 MG/2ML IJ SOLN
4.0000 mg | Freq: Four times a day (QID) | INTRAMUSCULAR | Status: DC | PRN
  Administered 2016-04-06 – 2016-04-08 (×3): 4 mg via INTRAVENOUS
  Filled 2016-04-06 (×3): qty 2

## 2016-04-06 NOTE — Discharge Instructions (Signed)
Please follow-up with your dentist as soon as possible for further evaluation. Take your antibiotic 4 times daily as prescribed. Take your pain medication as needed, as prescribed. Return to the emergency department for any increased swelling, or trouble swallowing.   Dolor dental (Dental Pain) Las causas del dolor dental pueden ser muchas, entre ellas, las siguientes:  Caries. La caries hace que el nervio del diente est expuesto al aire y a las temperaturas calientes o fras. Esto puede causar dolor o molestias.  Infeccin o absceso. Un absceso dental es un rea llena de pus infectado debido a una infeccin bacteriana en la parte interna del diente (pulpa). Por lo general, se produce en el extremo de la raz del diente.  Lesiones.  Una razn desconocida (idioptica). El dolor puede ser leve o intenso. El dolor quizs aparezca solamente en estas ocasiones:  Al masticar.  Al estar expuesto a una temperatura caliente o fra.  Al comer alimentos o tomar bebidas con azcar, por ejemplo, gaseosas o caramelos. El dolor tambin puede ser constante. INSTRUCCIONES PARA EL CUIDADO EN EL HOGAR Controle el dolor dental a fin de Recruitment consultant cambio. Las siguientes medidas pueden servir para Public house manager cualquier molestia que est sintiendo:  Tome los medicamentos solamente como se lo haya indicado el dentista.  Si le recetaron antibiticos, asegrese de terminarlos, incluso si comienza a Sports administrator.  Concurra a todas las visitas de control como se lo haya indicado el dentista. Esto es importante.  No se aplique calor en la parte externa de la cara.  Si el dentista se lo ha indicado, enjuguese la boca o haga grgaras con agua salada. Esto ayuda a Best boy y Forensic psychologist.  Puede preparar agua salada agregando  de cucharadita de sal en 1taza de agua templada.  Aplquese hielo sobre la zona dolorida de la cara:  Ponga el hielo en una bolsa plstica.  Coloque una toalla  entre la piel y la bolsa de hielo.  Coloque el hielo durante 106minutos, 2 a 3veces por Training and development officer.  Evite los alimentos o las bebidas que le causen [place], como los siguientes:  [place] o bebidas muy calientes o muy fros.  Alimentos o bebidas dulces o con azcar. SOLICITE ATENCIN MDICA SI:  El dolor no se alivia con los [employer].  Los sntomas empeoran.  Aparecen nuevos sntomas. SOLICITE ATENCIN MDICA DE INMEDIATO SI:  No puede abrir la boca.  Tiene problemas para respirar o tragar.  Tiene fiebre.  Tiene hinchazn en la cara, el cuello o la mandbula.   Esta informacin no tiene Marine scientist el consejo del mdico. Asegrese de hacerle al mdico cualquier pregunta que tenga.   Document Released: 10/14/2005 Document Revised: 02/28/2015 Elsevier Interactive Patient Education [employer].

## 2016-04-06 NOTE — ED Provider Notes (Signed)
[facility] Emergency Department Provider Note  Time seen: 7:30 AM  I have reviewed the triage vital signs and the nursing notes.   HISTORY  Chief Complaint Dental Pain    HPI Lori [name] is a 50 y.o. female who presents to the emergency department with right facial swelling and pain. According to the patient yesterday she began with pain in her right lower jaw, she went to her dentist however the dentist said he did not have any time to see her so he gave her one dose of pain medication and wrote her a prescription for amoxicillin. Patient states she took the amoxicillin yesterday and again this morning but states the swelling and pain is gotten worse so she came to the emergency department. Denies any fever, nausea or vomiting. Describes the pain as moderate dull aching pain with mild swelling to the right lower jaw. Worse with chewing. Patient had a crown placed on the molar that is painful several years ago.     Past Medical History  Diagnosis Date  . Cancer, uterine ([place]) 1998  . Chest pain     normal LV function by echo 10/2010  . Palpitations   . 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 High cholesterol   . Kidney stone     Patient Active Problem List   Diagnosis Date Noted  . BPPV (benign paroxysmal positional vertigo) 02/22/2016  . Perennial allergic rhinitis 02/22/2016  . Metabolic syndrome 0000000  . Eustachian tube dysfunction 09/15/2015  . Asthma, mild intermittent, poorly controlled 09/15/2015  . Cervical spondylosis 08/29/2015  . Overweight (BMI 25.0-29.9) 06/29/2015  . IBS (irritable bowel syndrome) 06/29/2015  . Right flank pain 06/22/2015  . 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 Cervical radiculitis 07/19/2014  . 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 Dysautonomia 10/01/2012  . History of syncope 07/07/2012  . Depression with anxiety 06/11/2011  . Dyslipidemia 11/02/2010    Past Surgical  History  Procedure Laterality Date  . Wrist surgery      broken wrist,left  . Cholecystectomy    . Cesarean section      x2  . Appendectomy    . Surgery on face      AFTER FALL  . Patent foramen ovale closure    . Neurocardiogenic syncope      Current Outpatient Rx  Name  Route  Sig  Dispense  Refill  . aspirin 81 MG tablet   Oral   Take 81 mg by mouth as needed for pain.         . B Complex Vitamins (VITAMIN B COMPLEX PO)   Oral   Take 1 tablet by mouth daily.         . Coenzyme Q10 (COQ10) 100 MG CAPS   Oral   Take 1 tablet by mouth daily.   30 each   0   . fluticasone (FLONASE) 50 MCG/ACT nasal spray   Nasal   Place into the nose.         . loratadine (CLARITIN) 10 MG tablet   Oral   Take 1 tablet (10 mg total) by mouth 2 (two) times daily.   60 tablet   2   . montelukast (SINGULAIR) 10 MG tablet   Oral   Take 1 tablet (10 mg total) by mouth at bedtime.   30 tablet   2   . Multiple Vitamin (MULTI-VITAMINS) TABS   Oral   Take 1 tablet by mouth daily.         Marland Kitchen  omeprazole (PRILOSEC) 40 MG capsule      TAKE ONE PILL ONCE DAILY, 30 MINUTES PRIOR TO EVENING MEAL.      12   . Probiotic Product (PROBIOTIC ADVANCED PO)   Oral   Take 1 each by mouth 1 day or 1 dose.           Allergies Atorvastatin; Ciprofloxacin hcl; Cyclobenzaprine; Nortriptyline; Pravastatin; Prednisone; and Sulfamethizole  Family History  Problem Relation Age of Onset  . Hypertension Mother   . Heart disease Father 31    MI    Social History Social History  Substance Use Topics  . Smoking status: Former Smoker -- 1.00 packs/day for 5 years    Types: Cigarettes    Quit date: 02/11/2006  . Smokeless tobacco: Never Used  . Alcohol Use: No    Review of Systems Constitutional: Negative for fever. Cardiovascular: Negative for chest pain. Respiratory: Negative for shortness of breath. Gastrointestinal: Negative for abdominal pain Genitourinary: Negative for  dysuria. Musculoskeletal: Negative for back pain Neurological: Negative for headache 10-point ROS otherwise negative.  ____________________________________________   PHYSICAL EXAM:  VITAL SIGNS: ED Triage Vitals  Enc Vitals Group     BP 04/06/16 0650 132/75 mmHg     Pulse Rate 04/06/16 0650 69     Resp 04/06/16 0650 20     Temp 04/06/16 0650 97.4 F (36.3 C)     Temp Source 04/06/16 0650 Oral     SpO2 04/06/16 0650 100 %     Weight 04/06/16 0650 156 lb (70.761 kg)     Height 04/06/16 0650 5\' 6"  (1.676 m)     Head Cir --      Peak Flow --      Pain Score 04/06/16 0652 10     Pain Loc --      Pain Edu? --      Excl. in [place]? --     Constitutional: Alert and oriented. Well appearing and in no distress. Eyes: Normal exam ENT   Head: Mild swelling/inflammation around the right lower teeth/jaw. Minimal erythema noted. Moderate tenderness to palpation of this area.   Mouth/Throat: Mucous membranes are moist.Patient has tenderness around the right lower molar, no abscess identified. Cardiovascular: Normal rate, regular rhythm. No murmur Respiratory: Normal respiratory effort without tachypnea nor retractions. Breath sounds are clear Gastrointestinal: Soft and nontender. No distention.  Musculoskeletal: Nontender with normal range of motion in all extremities. Neurologic:  Normal speech and language. No gross focal neurologic deficits Skin:  Skin is warm, dry and intact.  Psychiatric: Mood and affect are normal. Speech and behavior are normal.  ____________________________________________    INITIAL IMPRESSION / ASSESSMENT AND PLAN / ED COURSE  Pertinent labs & imaging results that were available during my care of the patient were reviewed by me and considered in my medical decision making (see chart for details).  Patient presents the emergency department with a likely infected right lower molar. Patient has a dentist appointment 7:30 in the morning on Monday, but was  worried that the tooth is worsening so she came to the emergency department. Patient has taken 2 doses of antibiotics this far. We will swap the patient to clindamycin 4 times daily, prescribed Percocet until the patient can be seen Monday morning by her dentist. I discussed strict return precautions for any increased swelling, or increased pain. At this time there is no sign of abscess or drainable fluid collection based on my examination.  ____________________________________________   FINAL CLINICAL IMPRESSION(S) /  ED DIAGNOSES  Dental infection   Harvest Dark, MD 04/06/16 [phone]

## 2016-04-06 NOTE — ED Notes (Signed)
Patient is resting with eyes closed. Even and non labored respirations noted. Ice pack on jaw noted.

## 2016-04-06 NOTE — ED Notes (Signed)
Patient reports right lower jaw pain for approximately 1 day.  Swelling noted.

## 2016-04-06 NOTE — ED Notes (Signed)
Pharmacy called and notified of need of viscous mouth solution. States they will send it to [facility]. Patient made aware.

## 2016-04-06 NOTE — ED Notes (Signed)
Ice pack given to patient to place on her R jaw.

## 2016-04-06 NOTE — ED Notes (Signed)
Toothache x 2 days

## 2016-04-06 NOTE — ED Notes (Signed)
Attempted to call report x 1  

## 2016-04-06 NOTE — H&P (Signed)
History and Physical    Lori [name] [id number] DOB: [birth year] DOA: 04/06/2016  Referring physician: Dr. Edd Fabian PCP: Loistine Chance, MD  Specialists: none  Chief Complaint: facial and neck pain  HPI: Lori [name] is a 50 y.o. female has a past medical history significant for asthma and uterine CA now with progressive right facial swelling and pain due to a dental infection not relieved by IV pain meds in the ER. Exam is c/w facial cellulitis. She is now admitted. Has nausea but no vomiting. Denies CP or SOB. Currently afebrile but pain is intractable.  Review of Systems: The patient denies anorexia, fever, weight loss,, vision loss, decreased hearing, hoarseness, chest pain, syncope, dyspnea on exertion, peripheral edema, balance deficits, hemoptysis, abdominal pain, melena, hematochezia, severe indigestion/heartburn, hematuria, incontinence, genital sores, muscle weakness, suspicious skin lesions, transient blindness, difficulty walking, depression, unusual weight change, abnormal bleeding, enlarged lymph nodes, angioedema, and breast masses.   Past Medical History  Diagnosis Date  . Cancer, uterine ([place]) 1998  . Chest pain     normal LV function by echo 10/2010  . Palpitations   . 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 High cholesterol   . Kidney stone   . Asthma    Past Surgical History  Procedure Laterality Date  . Wrist surgery      broken wrist,left  . Cholecystectomy    . Cesarean section      x2  . Appendectomy    . Surgery on face      AFTER FALL  . Patent foramen ovale closure    . Neurocardiogenic syncope     Social History:  reports that she quit smoking about 10 years ago. Her smoking use included Cigarettes. She has a 5 pack-year smoking history. She has never used smokeless tobacco. She reports that she does not drink alcohol or use illicit drugs.  Allergies  Allergen Reactions  . Atorvastatin Swelling  . Ciprofloxacin Hcl Other (See Comments)    Heart  condition  Lowers blood pressure  . Cyclobenzaprine Other (See Comments)    Angina; lowers BP  . Nortriptyline Other (See Comments)    Angina, lowers BP  . Pravastatin Other (See Comments)    gingivitis  . Prednisone Other (See Comments)    Has heart condition causes BP to gt too low.  Francine Graven Hives    Family History  Problem Relation Age of Onset  . Hypertension Mother   . Heart disease Father 83    MI    Prior to Admission medications   Medication Sig Start Date End Date Taking? Authorizing Provider  aspirin 81 MG tablet Take 81 mg by mouth as needed for pain.    Historical Provider, MD  B Complex Vitamins (VITAMIN B COMPLEX PO) Take 1 tablet by mouth daily.    Historical Provider, MD  clindamycin (CLEOCIN) 300 MG capsule Take 1 capsule (300 mg total) by mouth 4 (four) times daily. 04/06/16   Harvest Dark, MD  Coenzyme Q10 (COQ10) 100 MG CAPS Take 1 tablet by mouth daily. 09/15/15   Steele Sizer, MD  fluticasone (FLONASE) 50 MCG/ACT nasal spray Place into the nose. 08/18/15   Historical Provider, MD  loratadine (CLARITIN) 10 MG tablet Take 1 tablet (10 mg total) by mouth 2 (two) times daily. 12/18/15   Steele Sizer, MD  montelukast (SINGULAIR) 10 MG tablet Take 1 tablet (10 mg total) by mouth at bedtime. 12/18/15   Steele Sizer, MD  Multiple Vitamin (MULTI-VITAMINS) TABS Take 1  tablet by mouth daily.    Historical Provider, MD  omeprazole (PRILOSEC) 40 MG capsule TAKE ONE PILL ONCE DAILY, 30 MINUTES PRIOR TO EVENING MEAL. 11/28/15   Historical Provider, MD  oxyCODONE-acetaminophen (ROXICET) 5-325 MG tablet Take 1 tablet by mouth every 6 (six) hours as needed. 04/06/16   Harvest Dark, MD  Probiotic Product (PROBIOTIC ADVANCED PO) Take 1 each by mouth 1 day or 1 dose.    Historical Provider, MD   Physical Exam: Filed Vitals:   04/06/16 1306  BP: 150/85  Pulse: 72  Temp: 98.5 F (36.9 C)  TempSrc: Oral  Resp: 18  Height: 5\' 6"  (1.676 m)  Weight: 70.761 kg  (156 lb)  SpO2: 97%     General:  No apparent distress, WDWN, [place]/At  Eyes: PERRL, EOMI, no scleral icterus, conjunctiva clear  ENT: moist but red OP with swelling noted of right face with tenderness to palpation and mild erythema. TM's benign, dentition fair  Neck: supple, right-sided lymphadenopathy noted with tenderness. No bruits or thyromegaly  Cardiovascular: regular rate without MRG; 2+ peripheral pulses, no JVD, no peripheral edema  Respiratory: CTA biL, good air movement without wheezing, rhonchi or crackled, respiratory effort normal  Abdomen: soft, non tender to palpation, positive bowel sounds, no guarding, no rebound  Skin: no rashes or lesions  Musculoskeletal: normal bulk and tone, no joint swelling  Psychiatric: normal mood and affect, A&OX3  Neurologic: CN 2-12 grossly intact, Motor strength 5/5 in all 4 groups with non-focal sensory exam and symmetric DTR's  Labs on Admission:  Basic Metabolic Panel:  Recent Labs Lab 04/06/16 1356  NA 140  K 4.0  CL 103  CO2 28  GLUCOSE 91  BUN 11  CREATININE 0.64  CALCIUM 9.3   Liver Function Tests:  Recent Labs Lab 04/06/16 1356  AST 69*  ALT 75*  ALKPHOS 100  BILITOT 1.0  PROT 7.3  ALBUMIN 4.3   No results for input(s): LIPASE, AMYLASE in the last 168 hours. No results for input(s): AMMONIA in the last 168 hours. CBC:  Recent Labs Lab 04/06/16 1356  WBC 8.4  NEUTROABS 5.8  HGB 13.4  HCT 39.8  MCV 84.5  PLT 200   Cardiac Enzymes: No results for input(s): CKTOTAL, CKMB, CKMBINDEX, TROPONINI in the last 168 hours.  BNP (last 3 results) No results for input(s): BNP in the last 8760 hours.  ProBNP (last 3 results) No results for input(s): PROBNP in the last 8760 hours.  CBG: No results for input(s): GLUCAP in the last 168 hours.  Radiological Exams on Admission: Ct Maxillofacial W/cm  04/06/2016  CLINICAL DATA:  Facial swelling. EXAM: CT MAXILLOFACIAL WITH CONTRAST TECHNIQUE:  Multidetector CT imaging of the maxillofacial structures was performed with intravenous contrast. Multiplanar CT image reconstructions were also generated. A small metallic BB was placed on the right temple in order to reliably differentiate right from left. CONTRAST:  7mL ISOVUE-300 IOPAMIDOL (ISOVUE-300) INJECTION 61% COMPARISON:  None. FINDINGS: A tiny amount of fluid is seen in the left maxillary sinus. There is debris in the left maxillary sinus as well. Paranasal sinuses are otherwise unremarkable. Nasal bone fractures are seen, probably chronic given history. No overlying soft tissue swelling. A probable bone island is seen in C3, unchanged since the MRI from May 24, 2014. No other bony abnormalities. There is a periapical lucency adjacent to the second most posterior molar on the right best seen on series 3, image 61. This could be the source of the patient's right  facial symptoms. Limited views of the brain are normal. There is significant soft tissue swelling over the right side of the face, likely odontogenic in origin. There is also some increased enhancement in this region. However, no discrete abscess is seen. Visualized soft tissues are otherwise normal. IMPRESSION: 1. The second most posterior molar in the right mandible demonstrates a periapical lucency and is probably the source of right facial cellulitis. No abscess identified. Electronically Signed   By: Dorise Bullion III M.D   On: 04/06/2016 15:38    EKG: Independently reviewed.  Assessment/Plan Principal Problem:   Facial cellulitis Active Problems:   Depression with anxiety   Dental infection   Neck pain   Will observe on floor with IV ABX and IV fluids. Prn IV pain meds ordered. Ice packs. Labs in Am. Plan D/C to f/u with dental MD when stable.  Diet: clear liquids Fluids: 1/2 NS@100  DVT Prophylaxis: SQ heparin  Code Status: FULL  Family Communication: yes  Disposition Plan: home  Time spent: 50 min

## 2016-04-06 NOTE — ED Provider Notes (Signed)
[facility] Emergency Department Provider Note  ____________________________________________  Time seen: Approximately 1:16 PM  I have reviewed the triage vital signs and the nursing notes.   HISTORY  Chief Complaint Dental Pain    HPI Lori [name] is a 50 y.o. female who presents to emergency department complaining of worsening dental pain/infection. Patient was seen by her dentist yesterday and diagnosed with dental infection and placed on amoxicillin. Patient had 2 rounds of amoxicillin but reported to the emergency department this morning for increased pain and swelling. Patient was evaluated in this department and discharged with clindamycin and pain medication. Patient has taken one dose of oral clindamycin per reports that the swelling and pain has increased from this morning. Patient is stating that it is difficult to swallow. She is very upset and anxious.  Patient was seen by Dr. Kerman Passey this morning. Dr. Kerman Passey provides more history and is at bedside for interview and exam.   Past Medical History  Diagnosis Date  . Cancer, uterine ([place]) 1998  . Chest pain     normal LV function by echo 10/2010  . Palpitations   . Idioventricular rhythm ([place])   . High cholesterol   . Kidney stone   . Asthma     Patient Active Problem List   Diagnosis Date Noted  . Facial cellulitis 04/06/2016  . Dental infection 04/06/2016  . Neck pain 04/06/2016  . BPPV (benign paroxysmal positional vertigo) 02/22/2016  . Perennial allergic rhinitis 02/22/2016  . Metabolic syndrome 0000000  . Eustachian tube dysfunction 09/15/2015  . Asthma, mild intermittent, poorly controlled 09/15/2015  . Cervical spondylosis 08/29/2015  . Overweight (BMI 25.0-29.9) 06/29/2015  . IBS (irritable bowel syndrome) 06/29/2015  . Right flank pain 06/22/2015  . 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 Cervical radiculitis 07/19/2014  . Brain  syndrome, posttraumatic 05/25/2014  . Dysautonomia 10/01/2012  . History of syncope 07/07/2012  . Depression with anxiety 06/11/2011  . Dyslipidemia 11/02/2010    Past Surgical History  Procedure Laterality Date  . Wrist surgery      broken wrist,left  . Cholecystectomy    . Cesarean section      x2  . Appendectomy    . Surgery on face      AFTER FALL  . Patent foramen ovale closure    . Neurocardiogenic syncope      Current Outpatient Rx  Name  Route  Sig  Dispense  Refill  . aspirin 81 MG tablet   Oral   Take 81 mg by mouth as needed for pain.         . B Complex Vitamins (VITAMIN B COMPLEX PO)   Oral   Take 1 tablet by mouth daily.         . clindamycin (CLEOCIN) 300 MG capsule   Oral   Take 1 capsule (300 mg total) by mouth 4 (four) times daily.   40 capsule   0   . Coenzyme Q10 (COQ10) 100 MG CAPS   Oral   Take 1 tablet by mouth daily.   30 each   0   . fluticasone (FLONASE) 50 MCG/ACT nasal spray   Nasal   Place into the nose.         . loratadine (CLARITIN) 10 MG tablet   Oral   Take 1 tablet (10 mg total) by mouth 2 (two) times daily.   60 tablet   2   . montelukast (SINGULAIR) 10 MG tablet  Oral   Take 1 tablet (10 mg total) by mouth at bedtime.   30 tablet   2   . Multiple Vitamin (MULTI-VITAMINS) TABS   Oral   Take 1 tablet by mouth daily.         Marland Kitchen omeprazole (PRILOSEC) 40 MG capsule      TAKE ONE PILL ONCE DAILY, 30 MINUTES PRIOR TO EVENING MEAL.      12   . oxyCODONE-acetaminophen (ROXICET) 5-325 MG tablet   Oral   Take 1 tablet by mouth every 6 (six) hours as needed.   20 tablet   0   . Probiotic Product (PROBIOTIC ADVANCED PO)   Oral   Take 1 each by mouth 1 day or 1 dose.           Allergies Atorvastatin; Ciprofloxacin hcl; Cyclobenzaprine; Nortriptyline; Pravastatin; Prednisone; and Sulfamethizole  Family History  Problem Relation Age of Onset  . Hypertension Mother   . Heart disease Father 66    MI     Social History Social History  Substance Use Topics  . Smoking status: Former Smoker -- 1.00 packs/day for 5 years    Types: Cigarettes    Quit date: 02/11/2006  . Smokeless tobacco: Never Used  . Alcohol Use: No     Review of Systems  Constitutional: No fever/chills Eyes: No visual changes. ENT: No upper respiratory complaints.Patient reports severe right lower dental pain. Cardiovascular: no chest pain. Respiratory: no cough. No SOB. Gastrointestinal: No abdominal pain.  No nausea, no vomiting.   Musculoskeletal: Negative for musculoskeletal pain. Skin: Negative for rash, abrasions, lacerations, ecchymosis. Neurological: Negative for headaches, focal weakness or numbness. 10-point ROS otherwise negative.  ____________________________________________   PHYSICAL EXAM:  VITAL SIGNS: ED Triage Vitals  Enc Vitals Group     BP 04/06/16 1306 150/85 mmHg     Pulse Rate 04/06/16 1306 72     Resp 04/06/16 1306 18     Temp 04/06/16 1306 98.5 F (36.9 C)     Temp Source 04/06/16 1306 Oral     SpO2 04/06/16 1306 97 %     Weight 04/06/16 1306 156 lb (70.761 kg)     Height 04/06/16 1306 5\' 6"  (1.676 m)     Head Cir --      Peak Flow --      Pain Score 04/06/16 1309 10     Pain Loc --      Pain Edu? --      Excl. in [place]? --      Constitutional: Alert and oriented. Well appearing and in no acute distress. Eyes: Conjunctivae are normal. PERRL. EOMI. Head: Atraumatic.Edema is noted to the right mandible region and submandibular region. Area is very tender to palpation. No palpable fluctuance to palpation along the mandibular prominence. ENT:      Ears:       Nose: No congestion/rhinnorhea.      Mouth/Throat: Mucous membranes are moist. Erythema and edema is noted to the right buccal and gingival region. No oropharyngeal edema is noted. Uvula is midline. No loose dentition. No drainage into the oral cavity. Neck: No stridor.   Hematological/Lymphatic/Immunilogical: No  cervical lymphadenopathy. Cardiovascular: Normal rate, regular rhythm. Normal S1 and S2.  Good peripheral circulation. Respiratory: Normal respiratory effort without tachypnea or retractions. Lungs CTAB. Good air entry to the bases with no decreased or absent breath sounds. Musculoskeletal: Full range of motion to all extremities. No gross deformities appreciated. Neurologic:  Normal speech and language. No gross  focal neurologic deficits are appreciated.  Skin:  Skin is warm, dry and intact. No rash noted. Psychiatric: Mood and affect are normal. Speech and behavior are normal. Patient exhibits appropriate insight and judgement.   ____________________________________________   LABS (all labs ordered are listed, but only abnormal results are displayed)  Labs Reviewed  COMPREHENSIVE METABOLIC PANEL - Abnormal; Notable for the following:    AST 69 (*)    ALT 75 (*)    All other components within normal limits  CBC WITH DIFFERENTIAL/PLATELET  POC URINE PREG, ED   ____________________________________________  EKG   ____________________________________________  RADIOLOGY Diamantina [facility] Julieann Drummonds, personally viewed and evaluated these images as part of my medical decision making, as well as reviewing the written report by the radiologist.  Ct Maxillofacial W/cm  04/06/2016  CLINICAL DATA:  Facial swelling. EXAM: CT MAXILLOFACIAL WITH CONTRAST TECHNIQUE: Multidetector CT imaging of the maxillofacial structures was performed with intravenous contrast. Multiplanar CT image reconstructions were also generated. A small metallic BB was placed on the right temple in order to reliably differentiate right from left. CONTRAST:  37mL ISOVUE-300 IOPAMIDOL (ISOVUE-300) INJECTION 61% COMPARISON:  None. FINDINGS: A tiny amount of fluid is seen in the left maxillary sinus. There is debris in the left maxillary sinus as well. Paranasal sinuses are otherwise unremarkable. Nasal bone fractures are seen,  probably chronic given history. No overlying soft tissue swelling. A probable bone island is seen in C3, unchanged since the MRI from May 24, 2014. No other bony abnormalities. There is a periapical lucency adjacent to the second most posterior molar on the right best seen on series 3, image 61. This could be the source of the patient's right facial symptoms. Limited views of the brain are normal. There is significant soft tissue swelling over the right side of the face, likely odontogenic in origin. There is also some increased enhancement in this region. However, no discrete abscess is seen. Visualized soft tissues are otherwise normal. IMPRESSION: 1. The second most posterior molar in the right mandible demonstrates a periapical lucency and is probably the source of right facial cellulitis. No abscess identified. Electronically Signed   By: Dorise Bullion III M.D   On: 04/06/2016 15:38    ____________________________________________    PROCEDURES  Procedure(s) performed:       Medications  piperacillin-tazobactam (ZOSYN) IVPB 3.375 g (not administered)  0.45 % sodium chloride infusion (not administered)  lidocaine (XYLOCAINE) 2 % viscous mouth solution 15 mL (not administered)  piperacillin-tazobactam (ZOSYN) 3.375 GM/50ML IVPB (not administered)  sodium chloride 0.9 % bolus 1,000 mL (0 mLs Intravenous Stopped 04/06/16 1606)  clindamycin (CLEOCIN) IVPB 600 mg (0 mg Intravenous Stopped 04/06/16 1447)  morphine 4 MG/ML injection 4 mg (4 mg Intravenous Given 04/06/16 1352)  ondansetron (ZOFRAN) injection 4 mg (4 mg Intravenous Given 04/06/16 1352)  morphine 4 MG/ML injection 4 mg (4 mg Intravenous Given 04/06/16 1442)  iopamidol (ISOVUE-300) 61 % injection 75 mL (75 mLs Intravenous Contrast Given 04/06/16 1504)     ____________________________________________   INITIAL IMPRESSION / ASSESSMENT AND PLAN / ED COURSE  Pertinent labs & imaging results that were available during my care of  the patient were reviewed by me and considered in my medical decision making (see chart for details).  ----------------------------------------- 1:39 PM on 04/06/2016 -----------------------------------------  Patient returns to emergency department from a visit this morning with a complaint of increased swelling and pain to the right dentition/jaw region. Patient was seen by attending provider Dr. Kerman Passey this  morning. Patient was seen by her dentist yesterday diagnosed with dental infection and placed on amoxicillin. Patient reported increasing pain and swelling this morning. Dr. Kerman Passey change patient's antibiotics to oral clindamycin and prescribed pain medication. Patient took 1 dose of oral clindamycin and 1 prescription Percocet with no relief in symptoms. Patient reports that swelling has increased and now she feels like it is difficult to swallow. Upon patient's presentation back to the emergency department I discussed the case with Dr. Kerman Passey. He is present at bedside during interview and exam. He does report that there is increased edema when compared to this morning. At this time, it is felt the patient would be best served with imaging, labs, IV antibiotics, and IV pain medication.  Patient's diagnosis is consistent with dental abscess and uncontrolled pain. Patient's labs and imaging is reassuring. However, patient has no achieved any significant relief of pain with dosing of morphine. At this time, I have discussed the patient's care with the hospitalist, Dr. Doy Hutching. Dr. Doy Hutching recommends contacting [facility] to see if dental service will perform emergent root canal. After discussing the patient's case with the attending dental surgeon from [facility], they recommend continued IV antibiotics and IV pain medication as they would not perform any surgical dental procedures at this time. Dr. Doy Hutching is informed of this and verbalizes that the patient will be hospitalized for uncontrolled pain as  well as continued IV antibiotics for infection. Patient does have a appointment with her dentist on Monday morning and patient will be discharged in time for her to maintain this appointment for further definitive care. Patient care is turned over to Dr. Doy Hutching.     ____________________________________________  FINAL CLINICAL IMPRESSION(S) / ED DIAGNOSES  Final diagnoses:  Dental abscess  Uncontrolled pain      NEW MEDICATIONS STARTED DURING THIS VISIT:  New Prescriptions   No medications on file        This chart was dictated using voice recognition software/Dragon. Despite best efforts to proofread, errors can occur which can change the meaning. Any change was purely unintentional.    Darletta Moll, PA-C 04/06/16 1728  Harvest Dark, MD 04/07/16 1402

## 2016-04-06 NOTE — ED Notes (Signed)
States was seen earlier today and started antibiotics but pain medication not effective.

## 2016-04-06 NOTE — ED Notes (Signed)
Pt states that she has felt no pain relief from 1st dose of Morphine or from ice pack to R jaw.

## 2016-04-07 LAB — COMPREHENSIVE METABOLIC PANEL
ALBUMIN: 3.7 g/dL (ref 3.5–5.0)
ALK PHOS: 178 U/L — AB (ref 38–126)
ALT: 150 U/L — ABNORMAL HIGH (ref 14–54)
ANION GAP: 6 (ref 5–15)
AST: 112 U/L — ABNORMAL HIGH (ref 15–41)
BUN: 7 mg/dL (ref 6–20)
CALCIUM: 8.5 mg/dL — AB (ref 8.9–10.3)
CHLORIDE: 106 mmol/L (ref 101–111)
CO2: 27 mmol/L (ref 22–32)
Creatinine, Ser: 0.66 mg/dL (ref 0.44–1.00)
GFR calc non Af Amer: >60 mL/min (ref >60)
GLUCOSE: 89 mg/dL (ref 65–99)
POTASSIUM: 4.5 mmol/L (ref 3.5–5.1)
SODIUM: 139 mmol/L (ref 135–145)
Total Bilirubin: 0.8 mg/dL (ref 0.3–1.2)
Total Protein: 6.5 g/dL (ref 6.5–8.1)

## 2016-04-07 LAB — CBC
HCT: 37.3 % (ref 35.0–47.0)
HEMOGLOBIN: 12.5 g/dL (ref 12.0–16.0)
MCH: 29.2 pg (ref 26.0–34.0)
MCHC: 33.4 g/dL (ref 32.0–36.0)
MCV: 87.2 fL (ref 80.0–100.0)
PLATELETS: 170 10*3/uL (ref 150–440)
RBC: 4.28 MIL/uL (ref 3.80–5.20)
RDW: 13.3 % (ref 11.5–14.5)
WBC: 8 10*3/uL (ref 3.6–11.0)

## 2016-04-07 MED ORDER — LIDOCAINE VISCOUS 2 % MT SOLN: 15.0000 mL | OROMUCOSAL | Status: DC | PRN

## 2016-04-07 MED ORDER — IBUPROFEN 600 MG PO TABS: 600.0000 mg | ORAL_TABLET | Freq: Three times a day (TID) | ORAL | Status: DC

## 2016-04-07 MED ORDER — HYDROMORPHONE HCL 1 MG/ML IJ SOLN
1.0000 mg | Freq: Once | INTRAMUSCULAR | Status: AC
  Administered 2016-04-07: 22:00:00 1 mg via INTRAVENOUS
  Filled 2016-04-07: qty 1

## 2016-04-07 MED ORDER — CLINDAMYCIN HCL 300 MG PO CAPS: 300.0000 mg | ORAL_CAPSULE | Freq: Three times a day (TID) | ORAL | Status: DC

## 2016-04-07 MED ORDER — IBUPROFEN 600 MG PO TABS
600.0000 mg | ORAL_TABLET | Freq: Three times a day (TID) | ORAL | Status: DC
  Administered 2016-04-07 (×3): 600 mg via ORAL
  Filled 2016-04-07 (×4): qty 1

## 2016-04-07 NOTE — Discharge Summary (Signed)
[facility] NAME: Lori [name]    MR#:  [id number]  DATE OF BIRTH:  1966-01-01  DATE OF ADMISSION:  04/06/2016 ADMITTING PHYSICIAN: Idelle Crouch, MD  DATE OF DISCHARGE: *04/07/2016  PRIMARY CARE PHYSICIAN: Loistine Chance, MD    ADMISSION DIAGNOSIS:  Dental abscess [K04.7] Uncontrolled pain [R52]  DISCHARGE DIAGNOSIS:  Principal Problem:   Facial cellulitis Active Problems:   Depression with anxiety   Dental infection   Neck pain   SECONDARY DIAGNOSIS:   Past Medical History  Diagnosis Date  . Cancer, uterine ([place]) 1998  . Chest pain     normal LV function by echo 10/2010  . Palpitations   . Idioventricular rhythm ([place])   . High cholesterol   . Kidney stone   . Asthma     HOSPITAL COURSE:   50 y/o female with uterine cancer history Presented with right-sided facial pain. For further details please refer to H&P.  1. Facial cellulitis: This was due to second most posterior molar in the right mandible. She was started on Zosyn and will be discharged on clindamycin. She will need to see a dentist which is scheduled for tomorrow. She will continue pain control using anti-inflammatories and narcotics as needed.     DISCHARGE CONDITIONS AND DIET:   home regular diet  CONSULTS OBTAINED:     DRUG ALLERGIES:   Allergies  Allergen Reactions  . Atorvastatin Swelling  . Ciprofloxacin Hcl Other (See Comments)    Heart condition  Lowers blood pressure  . Cyclobenzaprine Other (See Comments)    Angina; lowers BP  . Nortriptyline Other (See Comments)    Angina, lowers BP  . Pravastatin Other (See Comments)    gingivitis  . Prednisone Other (See Comments)    Has heart condition causes BP to gt too low.  Francine Graven Hives    DISCHARGE MEDICATIONS:   Current Discharge Medication List    START taking these medications   Details  ibuprofen (ADVIL,MOTRIN) 600 MG tablet Take 1 tablet (600 mg total) by  mouth 3 (three) times daily. Qty: 30 tablet, Refills: 0    lidocaine (XYLOCAINE) 2 % solution Use as directed 15 mLs in the mouth or throat every 4 (four) hours as needed for mouth pain. Qty: 100 mL, Refills: 0      CONTINUE these medications which have CHANGED   Details  clindamycin (CLEOCIN) 300 MG capsule Take 1 capsule (300 mg total) by mouth 3 (three) times daily. Qty: 33 capsule, Refills: 0      CONTINUE these medications which have NOT CHANGED   Details  azelastine (ASTELIN) 0.1 % nasal spray Place 1-2 sprays into both nostrils 2 (two) times daily. Refills: 12    B Complex Vitamins (VITAMIN B COMPLEX PO) Take 1 tablet by mouth daily.    Milk Thistle 500 MG CAPS Take 1,000 mg by mouth every morning.    Multiple Vitamin (MULTI-VITAMINS) TABS Take 1 tablet by mouth daily.    oxyCODONE-acetaminophen (ROXICET) 5-325 MG tablet Take 1 tablet by mouth every 6 (six) hours as needed. Qty: 20 tablet, Refills: 0    Probiotic Product (PROBIOTIC ADVANCED PO) Take 1 capsule by mouth at bedtime.       STOP taking these medications     aspirin 81 MG tablet      fluticasone (FLONASE) 50 MCG/ACT nasal spray      loratadine (CLARITIN) 10 MG tablet      montelukast (  SINGULAIR) 10 MG tablet      omeprazole (PRILOSEC) 40 MG capsule               Today   CHIEF COMPLAINT:  Patient with pain right cheek   VITAL SIGNS:  Blood pressure 106/51, pulse 68, temperature 98.6 F (37 C), temperature source Oral, resp. rate 16, height 5\' 6"  (1.676 m), weight 77.973 kg (171 lb 14.4 oz), SpO2 95 %.   REVIEW OF SYSTEMS:  Review of Systems  Constitutional: Negative for fever, chills and malaise/fatigue.  HENT: Negative for ear discharge, ear pain, hearing loss, nosebleeds and sore throat.        Right molar edema  Eyes: Negative for blurred vision and pain.  Respiratory: Negative for cough, hemoptysis, shortness of breath and wheezing.   Cardiovascular: Negative for chest pain,  palpitations and leg swelling.  Gastrointestinal: Negative for nausea, vomiting, abdominal pain, diarrhea and blood in stool.  Genitourinary: Negative for dysuria.  Musculoskeletal: Negative for back pain.  Skin:       Facial edema from tooth  Neurological: Negative for dizziness, tremors, speech change, focal weakness, seizures and headaches.  Endo/Heme/Allergies: Does not bruise/bleed easily.  Psychiatric/Behavioral: Negative for depression, suicidal ideas and hallucinations.     PHYSICAL EXAMINATION:  GENERAL:  50 y.o.-year-old patient lying in the bed with no acute distress.  NECK:  Supple, no jugular venous distention. No thyroid enlargement, no tenderness.  Right mandible pain and swelling no erythema, molar right side with edema LUNGS: Normal breath sounds bilaterally, no wheezing, rales,rhonchi  No use of accessory muscles of respiration.  CARDIOVASCULAR: S1, S2 normal. No murmurs, rubs, or gallops.  ABDOMEN: Soft, non-tender, non-distended. Bowel sounds present. No organomegaly or mass.  EXTREMITIES: No pedal edema, cyanosis, or clubbing.  PSYCHIATRIC: The patient is alert and oriented x 3.  SKIN: No obvious rash, lesion, or ulcer.   DATA REVIEW:   CBC  Recent Labs Lab 04/07/16 0505  WBC 8.0  HGB 12.5  HCT 37.3  PLT 170    Chemistries   Recent Labs Lab 04/07/16 0505  NA 139  K 4.5  CL 106  CO2 27  GLUCOSE 89  BUN 7  CREATININE 0.66  CALCIUM 8.5*  AST 112*  ALT 150*  ALKPHOS 178*  BILITOT 0.8    Cardiac Enzymes No results for input(s): TROPONINI in the last 168 hours.  Microbiology Results  @MICRORSLT48 @  RADIOLOGY:  Ct Maxillofacial W/cm  04/06/2016  CLINICAL DATA:  Facial swelling. EXAM: CT MAXILLOFACIAL WITH CONTRAST TECHNIQUE: Multidetector CT imaging of the maxillofacial structures was performed with intravenous contrast. Multiplanar CT image reconstructions were also generated. A small metallic BB was placed on the right temple in order to  reliably differentiate right from left. CONTRAST:  63mL ISOVUE-300 IOPAMIDOL (ISOVUE-300) INJECTION 61% COMPARISON:  None. FINDINGS: A tiny amount of fluid is seen in the left maxillary sinus. There is debris in the left maxillary sinus as well. Paranasal sinuses are otherwise unremarkable. Nasal bone fractures are seen, probably chronic given history. No overlying soft tissue swelling. A probable bone island is seen in C3, unchanged since the MRI from May 24, 2014. No other bony abnormalities. There is a periapical lucency adjacent to the second most posterior molar on the right best seen on series 3, image 61. This could be the source of the patient's right facial symptoms. Limited views of the brain are normal. There is significant soft tissue swelling over the right side of the face, likely odontogenic in  origin. There is also some increased enhancement in this region. However, no discrete abscess is seen. Visualized soft tissues are otherwise normal. IMPRESSION: 1. The second most posterior molar in the right mandible demonstrates a periapical lucency and is probably the source of right facial cellulitis. No abscess identified. Electronically Signed   By: Dorise Bullion III M.D   On: 04/06/2016 15:38      Management plans discussed with the patient and she is in agreement. Stable for discharge home  Patient should follow up with dentist monday  CODE STATUS:     Code Status Orders        Start     Ordered   04/06/16 1956  Full code   Continuous     04/06/16 1955    Code Status History    Date Active Date Inactive Code Status Order ID Comments User Context   This patient has a current code status but no historical code status.      TOTAL TIME TAKING CARE OF THIS PATIENT: 36 minutes.    Note: This dictation was prepared with Dragon dictation along with smaller phrase technology. Any transcriptional errors that result from this process are unintentional.  Baley Lorimer M.D on  04/07/2016 at 10:26 AM  Between 7am to 6pm - Pager - [phone] After 6pm go to [email or web address] - password [email or web address] [facility]  Office  [phone]  CC: Primary care physician; Loistine Chance, MD

## 2016-04-07 NOTE — Plan of Care (Signed)
Problem: Pain Managment: Goal: General experience of comfort will improve Outcome: Not Progressing Increased swelling to face and lip.  Pt with pain of 10/10.  Encouraged pt to ask for pain medication more frequently before pain gets to 10.

## 2016-04-08 NOTE — Progress Notes (Signed)
Pt prepared for discharge with pain medication and nausea medication.  Removed IV.  Pt given discharge instructions in Spanish and reviewed in [place] with husband.  Pt escorted in wheelchair to front door in company of husband with plans to go to dentist this morning for further evaluation. Dorna Bloom RN

## 2016-04-08 NOTE — Plan of Care (Signed)
Problem: Pain Managment: Goal: General experience of comfort will improve Outcome: Not Progressing Pt moaning and crying from pain.  Using ice pack all shift and pt received additional 1 mg dose of Dilaudid.  Pt able to sleep for very short periods.

## 2016-04-11 ENCOUNTER — Ambulatory Visit (INDEPENDENT_AMBULATORY_CARE_PROVIDER_SITE_OTHER): Payer: Federal, State, Local not specified - PPO | Admitting: Family Medicine

## 2016-04-11 ENCOUNTER — Encounter: Payer: Self-pay | Admitting: Family Medicine

## 2016-04-11 VITALS — BP 128/74 | HR 71 | Temp 98.1°F | Resp 16 | Ht 66.0 in | Wt 159.4 lb

## 2016-04-11 DIAGNOSIS — L03211 Cellulitis of face: Secondary | ICD-10-CM

## 2016-04-11 MED ORDER — ACETAMINOPHEN-CODEINE 300-30 MG PO TABS: 1.0000 | ORAL_TABLET | ORAL | Status: DC | PRN

## 2016-04-11 NOTE — Progress Notes (Signed)
Name: Lori [name]   MRN: [id number]    DOB: [birth year]   Date:04/11/2016       Progress Note  Subjective  Chief Complaint  Chief Complaint  Patient presents with  . Hospitalization Follow-up    HPI  Facial cellulitis: she had a crown placed on 2nd molar Summer of 2016, and had pain intermittent right ear pain, she states that last week she developed right jaw swelling and severe pain, she saw her dentist and MRI showed tooth abscess with facial cellulitis, right side of face got very large and also right side of neck, she went to [facility] and was given antibiotics. She has seen oral surgeon this week and he drained the tooth abscess. The facial cellulitis is down, the pain is not as intense, and also able to control pain with medication. She does not like side effects of Percocet or Vicoprofen, causes nausea and also gets sleepy. She would like to try something else. She will go back for a root canal.    Patient Active Problem List   Diagnosis Date Noted  . Facial cellulitis 04/06/2016  . Dental infection 04/06/2016  . Neck pain 04/06/2016  . BPPV (benign paroxysmal positional vertigo) 02/22/2016  . Perennial allergic rhinitis 02/22/2016  . Metabolic syndrome 81/82/9937  . Eustachian tube dysfunction 09/15/2015  . Asthma, mild intermittent, poorly controlled 09/15/2015  . Cervical spondylosis 08/29/2015  . Overweight (BMI 25.0-29.9) 06/29/2015  . IBS (irritable bowel syndrome) 06/29/2015  . Right flank pain 06/22/2015  . 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 Cervical radiculitis 07/19/2014  . 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 Dysautonomia 10/01/2012  . History of syncope 07/07/2012  . Depression with anxiety 06/11/2011  . Dyslipidemia 11/02/2010    Past Surgical History  Procedure Laterality Date  . Wrist surgery      broken wrist,left  . Cholecystectomy    . Cesarean section      x2  . Appendectomy    . Surgery on face     AFTER FALL  . Patent foramen ovale closure    . Neurocardiogenic syncope      Family History  Problem Relation Age of Onset  . Hypertension Mother   . Heart disease Father 23    MI    Social History   Social History  . Marital Status: Married    Spouse Name: N/A  . Number of Children: N/A  . Years of Education: N/A   Occupational History  . Not on file.   Social History Main Topics  . Smoking status: Former Smoker -- 1.00 packs/day for 5 years    Types: Cigarettes    Quit date: 02/11/2006  . Smokeless tobacco: Never Used  . Alcohol Use: No  . Drug Use: No  . Sexual Activity:    Partners: Male   Other Topics Concern  . Not on file   Social History Narrative     Current outpatient prescriptions:  .  clindamycin (CLEOCIN) 300 MG capsule, Take 1 capsule (300 mg total) by mouth 3 (three) times daily., Disp: 33 capsule, Rfl: 0 .  lidocaine (XYLOCAINE) 2 % solution, Use as directed 15 mLs in the mouth or throat every 4 (four) hours as needed for mouth pain., Disp: 100 mL, Rfl: 0 .  Acetaminophen-Codeine (TYLENOL/CODEINE #3) 300-30 MG tablet, Take 1 tablet by mouth every 4 (four) hours as needed for pain., Disp: 30 tablet, Rfl: 0 .  acyclovir (ZOVIRAX) 800 MG tablet, TAKE 1 TABLET  BY ORAL ROUTE 2 TIMES A DAY FOR 5 DAYS, Disp: , Rfl: 5 .  azelastine (ASTELIN) 0.1 % nasal spray, Place 1-2 sprays into both nostrils 2 (two) times daily., Disp: , Rfl: 12 .  B Complex Vitamins (VITAMIN B COMPLEX PO), Take 1 tablet by mouth daily., Disp: , Rfl:  .  fluticasone (FLONASE) 50 MCG/ACT nasal spray, USE 2 SPRAYS IN EACH NOSTRL DAILY, Disp: , Rfl: 12 .  ibuprofen (ADVIL,MOTRIN) 600 MG tablet, Take 1 tablet (600 mg total) by mouth 3 (three) times daily., Disp: 30 tablet, Rfl: 0 .  Milk Thistle 500 MG CAPS, Take 1,000 mg by mouth every morning., Disp: , Rfl:  .  montelukast (SINGULAIR) 10 MG tablet, TAKE 1 TABLET (10 MG TOTAL) BY MOUTH AT BEDTIME., Disp: , Rfl: 2 .  Multiple Vitamin  (MULTI-VITAMINS) TABS, Take 1 tablet by mouth daily., Disp: , Rfl:  .  Probiotic Product (PROBIOTIC ADVANCED PO), Take 1 capsule by mouth at bedtime. , Disp: , Rfl:   Allergies  Allergen Reactions  . Atorvastatin Swelling  . Ciprofloxacin Hcl Other (See Comments)    Heart condition  Lowers blood pressure  . Cyclobenzaprine Other (See Comments)    Angina; lowers BP  . Nortriptyline Other (See Comments)    Angina, lowers BP  . Pravastatin Other (See Comments)    gingivitis  . Prednisone Other (See Comments)    Has heart condition causes BP to gt too low.  . Sulfamethizole Hives     ROS  Ten systems reviewed and is negative except as mentioned in HPI   Objective  Filed Vitals:   04/11/16 1221  BP: 128/74  Pulse: 71  Temp: 98.1 F (36.7 C)  TempSrc: Oral  Resp: 16  Height: '5\' 6"'$  (1.676 m)  Weight: 159 lb 6.4 oz (72.303 kg)  SpO2: 96%    Body mass index is 25.74 kg/(m^2).  Physical Exam  Constitutional: Patient appears well-developed and well-nourished. O No distress.  HEENT: head atraumatic, normocephalic, pupils equal and reactive to light, some irritation on gum, swelling on right lower jaw and tenderness to touch,  neck supple, throat within normal limits Cardiovascular: Normal rate, regular rhythm and normal heart sounds.  No murmur heard. No BLE edema. Pulmonary/Chest: Effort normal and breath sounds normal. No respiratory distress. Abdominal: Soft.  There is no tenderness. Psychiatric: Patient has a normal mood and affect. behavior is normal. Judgment and thought content normal.  Recent Results (from the past 2160 hour(s))  Comprehensive metabolic panel     Status: Abnormal   Collection Time: 04/06/16  1:56 PM  Result Value Ref Range   Sodium 140 135 - 145 mmol/L   Potassium 4.0 3.5 - 5.1 mmol/L   Chloride 103 101 - 111 mmol/L   CO2 28 22 - 32 mmol/L   Glucose, Bld 91 65 - 99 mg/dL   BUN 11 6 - 20 mg/dL   Creatinine, Ser 0.64 0.44 - 1.00 mg/dL   Calcium  9.3 8.9 - 10.3 mg/dL   Total Protein 7.3 6.5 - 8.1 g/dL   Albumin 4.3 3.5 - 5.0 g/dL   AST 69 (H) 15 - 41 U/L   ALT 75 (H) 14 - 54 U/L   Alkaline Phosphatase 100 38 - 126 U/L   Total Bilirubin 1.0 0.3 - 1.2 mg/dL   GFR calc non Af Amer >60 >60 mL/min   GFR calc Af Amer >60 >60 mL/min    Comment: (NOTE) The eGFR has been calculated using the  CKD EPI equation. This calculation has not been validated in all clinical situations. eGFR's persistently <60 mL/min signify possible Chronic Kidney Disease.    Anion gap 9 5 - 15  CBC with Differential     Status: None   Collection Time: 04/06/16  1:56 PM  Result Value Ref Range   WBC 8.4 3.6 - 11.0 K/uL   RBC 4.71 3.80 - 5.20 MIL/uL   Hemoglobin 13.4 12.0 - 16.0 g/dL   HCT 39.8 35.0 - 47.0 %   MCV 84.5 80.0 - 100.0 fL   MCH 28.5 26.0 - 34.0 pg   MCHC 33.7 32.0 - 36.0 g/dL   RDW 13.6 11.5 - 14.5 %   Platelets 200 150 - 440 K/uL   Neutrophils Relative % 69 %   Neutro Abs 5.8 1.4 - 6.5 K/uL   Lymphocytes Relative 24 %   Lymphs Abs 2.0 1.0 - 3.6 K/uL   Monocytes Relative 6 %   Monocytes Absolute 0.5 0.2 - 0.9 K/uL   Eosinophils Relative 1 %   Eosinophils Absolute 0.1 0 - 0.7 K/uL   Basophils Relative 0 %   Basophils Absolute 0.0 0 - 0.1 K/uL  CBC     Status: None   Collection Time: 04/07/16  5:05 AM  Result Value Ref Range   WBC 8.0 3.6 - 11.0 K/uL   RBC 4.28 3.80 - 5.20 MIL/uL   Hemoglobin 12.5 12.0 - 16.0 g/dL   HCT 37.3 35.0 - 47.0 %   MCV 87.2 80.0 - 100.0 fL   MCH 29.2 26.0 - 34.0 pg   MCHC 33.4 32.0 - 36.0 g/dL   RDW 13.3 11.5 - 14.5 %   Platelets 170 150 - 440 K/uL  Comprehensive metabolic panel     Status: Abnormal   Collection Time: 04/07/16  5:05 AM  Result Value Ref Range   Sodium 139 135 - 145 mmol/L   Potassium 4.5 3.5 - 5.1 mmol/L   Chloride 106 101 - 111 mmol/L   CO2 27 22 - 32 mmol/L   Glucose, Bld 89 65 - 99 mg/dL   BUN 7 6 - 20 mg/dL   Creatinine, Ser 0.66 0.44 - 1.00 mg/dL   Calcium 8.5 (L) 8.9 - 10.3  mg/dL   Total Protein 6.5 6.5 - 8.1 g/dL   Albumin 3.7 3.5 - 5.0 g/dL   AST 112 (H) 15 - 41 U/L   ALT 150 (H) 14 - 54 U/L   Alkaline Phosphatase 178 (H) 38 - 126 U/L   Total Bilirubin 0.8 0.3 - 1.2 mg/dL   GFR calc non Af Amer >60 >60 mL/min   GFR calc Af Amer >60 >60 mL/min    Comment: (NOTE) The eGFR has been calculated using the CKD EPI equation. This calculation has not been validated in all clinical situations. eGFR's persistently <60 mL/min signify possible Chronic Kidney Disease.    Anion gap 6 5 - 15     PHQ2/9: Depression screen [facility] 2/9 04/11/2016 12/18/2015 10/03/2015 09/15/2015 06/29/2015  Decreased Interest 0 0 0 0 0  Down, Depressed, Hopeless 0 0 0 0 0  PHQ - 2 Score 0 0 0 0 0     Fall Risk: Fall Risk  04/11/2016 12/18/2015 10/03/2015 09/15/2015 06/29/2015  Falls in the past year? No No No No Yes  Number falls in past yr: - - - - 1  Injury with Fall? - - - - Yes    Functional Status Survey: Is the patient deaf or have  difficulty hearing?: No Does the patient have difficulty seeing, even when wearing glasses/contacts?: No Does the patient have difficulty concentrating, remembering, or making decisions?: No Does the patient have difficulty walking or climbing stairs?: No Does the patient have difficulty dressing or bathing?: No Does the patient have difficulty doing errands alone such as visiting a doctor's office or shopping?: No    Assessment & Plan  1. Facial cellulitis  Continue follow up with dentist, explained that she can also get a second opinion  - Acetaminophen-Codeine (TYLENOL/CODEINE #3) 300-30 MG tablet; Take 1 tablet by mouth every 4 (four) hours as needed for pain.  Dispense: 30 tablet; Refill: 0

## 2016-04-16 ENCOUNTER — Other Ambulatory Visit: Payer: Self-pay | Admitting: Family Medicine

## 2016-04-16 NOTE — Telephone Encounter (Signed)
Patient would like a cream sent to the [facility] on [address] for the facial cellulitis.  Patient would prefer not to have another pill for it.  Please call patient once complete at [phone].

## 2016-04-16 NOTE — Telephone Encounter (Signed)
Sorry there is no cream to treat that. How is she doing? Needs to finish all antibiotics

## 2016-04-16 NOTE — Telephone Encounter (Signed)
Patient stated that she is doing much better and will be finished with the antibiotic tomorrow.   She stated that she needs the Acyclovir cream for Herpes. She stated that she has been stressed out and had a flare.  [facility] to [facility] [address]

## 2016-04-17 MED ORDER — ACYCLOVIR 5 % EX OINT: 1.0000 "application " | TOPICAL_OINTMENT | CUTANEOUS | Status: DC

## 2016-04-17 NOTE — Addendum Note (Signed)
Addended by: Johnnette Litter A on: 04/17/2016 09:59 AM   Modules accepted: Orders

## 2016-04-17 NOTE — Addendum Note (Signed)
Addended by: Steele Sizer F on: 04/17/2016 09:33 AM   Modules accepted: Medications

## 2016-04-26 ENCOUNTER — Encounter: Payer: Self-pay | Admitting: Family Medicine

## 2016-04-26 ENCOUNTER — Telehealth: Payer: Self-pay

## 2016-04-26 MED ORDER — AMOXICILLIN-POT CLAVULANATE 875-125 MG PO TABS: 1.0000 | ORAL_TABLET | Freq: Two times a day (BID) | ORAL | Status: DC

## 2016-04-26 NOTE — Telephone Encounter (Signed)
Patient and husband came by today stating that the infection has returned and was told by her dentist to give him a call but he was not in, so they were instructed to contact her pcp. They were also told to try Amoxicillin instead. [facility]  Please advise.

## 2016-05-13 ENCOUNTER — Ambulatory Visit: Payer: Federal, State, Local not specified - PPO | Admitting: Family Medicine

## 2016-05-14 ENCOUNTER — Encounter: Payer: Self-pay | Admitting: Family Medicine

## 2016-05-14 ENCOUNTER — Ambulatory Visit (INDEPENDENT_AMBULATORY_CARE_PROVIDER_SITE_OTHER): Payer: Federal, State, Local not specified - PPO | Admitting: Family Medicine

## 2016-05-14 VITALS — BP 116/74 | HR 70 | Temp 97.4°F | Resp 16 | Ht 66.0 in | Wt 153.8 lb

## 2016-05-14 DIAGNOSIS — R109 Unspecified abdominal pain: Secondary | ICD-10-CM | POA: Diagnosis not present

## 2016-05-14 DIAGNOSIS — A09 Infectious gastroenteritis and colitis, unspecified: Secondary | ICD-10-CM

## 2016-05-14 DIAGNOSIS — L03211 Cellulitis of face: Secondary | ICD-10-CM

## 2016-05-14 DIAGNOSIS — R197 Diarrhea, unspecified: Secondary | ICD-10-CM

## 2016-05-14 MED ORDER — METRONIDAZOLE 500 MG PO TABS: 500.0000 mg | ORAL_TABLET | Freq: Three times a day (TID) | ORAL | Status: DC

## 2016-05-14 NOTE — Progress Notes (Signed)
Name: Lori [name]   MRN: [id number]    DOB: 1966/02/03   Date:05/14/2016       Progress Note  Subjective  Chief Complaint  Chief Complaint  Patient presents with  . Follow-up    patient is here for a 88-monthf/u for facial cellulitis  . Abdominal Pain    patient stated that every time she eats she has discomfort. some bowel sounds and pressure.    HPI  Facial Cellulitis: she was diagnosed with right facial cellulitis about one month ago by Urgent Care. She had an infected crown, and is following up with dentist also. She took a dose of Clindamycin and also Augmentin, she developed diarrhea shortly after she started on antibiotics. She states she has abdominal cramping followed by loose stools after she eats, no blood in stools or fever, appetite is back to normal . No longer having pain or swelling on her face.   Patient Active Problem List   Diagnosis Date Noted  . Facial cellulitis 04/06/2016  . Dental infection 04/06/2016  . Neck pain 04/06/2016  . BPPV (benign paroxysmal positional vertigo) 02/22/2016  . Perennial allergic rhinitis 02/22/2016  . Metabolic syndrome 173/53/2992 . Eustachian tube dysfunction 09/15/2015  . Asthma, mild intermittent, poorly controlled 09/15/2015  . Cervical spondylosis 08/29/2015  . Overweight (BMI 25.0-29.9) 06/29/2015  . IBS (irritable bowel syndrome) 06/29/2015  . Right flank pain 06/22/2015  . 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 Cervical radiculitis 07/19/2014  . 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 Dysautonomia 10/01/2012  . History of syncope 07/07/2012  . Depression with anxiety 06/11/2011  . Dyslipidemia 11/02/2010    Past Surgical History  Procedure Laterality Date  . Wrist surgery      broken wrist,left  . Cholecystectomy    . Cesarean section      x2  . Appendectomy    . Surgery on face      AFTER FALL  . Patent foramen ovale closure    . Neurocardiogenic syncope       Family History  Problem Relation Age of Onset  . Hypertension Mother   . Heart disease Father 69   MI    Social History   Social History  . Marital Status: Married    Spouse Name: N/A  . Number of Children: N/A  . Years of Education: N/A   Occupational History  . Not on file.   Social History Main Topics  . Smoking status: Former Smoker -- 1.00 packs/day for 5 years    Types: Cigarettes    Quit date: 02/11/2006  . Smokeless tobacco: Never Used  . Alcohol Use: No  . Drug Use: No  . Sexual Activity:    Partners: Male   Other Topics Concern  . Not on file   Social History Narrative     Current outpatient prescriptions:  .  Acetaminophen-Codeine (TYLENOL/CODEINE #3) 300-30 MG tablet, Take 1 tablet by mouth every 4 (four) hours as needed for pain., Disp: 30 tablet, Rfl: 0 .  acyclovir (ZOVIRAX) 800 MG tablet, TAKE 1 TABLET BY ORAL ROUTE 2 TIMES A DAY FOR 5 DAYS, Disp: , Rfl: 5 .  acyclovir ointment (ZOVIRAX) 5 %, Apply 1 application topically every 3 (three) hours., Disp: 30 g, Rfl: 0 .  azelastine (ASTELIN) 0.1 % nasal spray, Place 1-2 sprays into both nostrils 2 (two) times daily., Disp: , Rfl: 12 .  B Complex Vitamins (VITAMIN B COMPLEX PO), Take 1 tablet by  mouth daily., Disp: , Rfl:  .  etodolac (LODINE) 400 MG tablet, TAKE 1 TABLET BY MOUTH EVERY 4-6 HOURS AS NEEDED FOR PAIN. MAX 3/DAY, Disp: , Rfl: 0 .  fluticasone (FLONASE) 50 MCG/ACT nasal spray, USE 2 SPRAYS IN EACH NOSTRL DAILY, Disp: , Rfl: 12 .  ibuprofen (ADVIL,MOTRIN) 600 MG tablet, Take 1 tablet (600 mg total) by mouth 3 (three) times daily., Disp: 30 tablet, Rfl: 0 .  metroNIDAZOLE (FLAGYL) 500 MG tablet, Take 1 tablet (500 mg total) by mouth 3 (three) times daily., Disp: 30 tablet, Rfl: 0 .  Milk Thistle 500 MG CAPS, Take 1,000 mg by mouth every morning., Disp: , Rfl:  .  montelukast (SINGULAIR) 10 MG tablet, TAKE 1 TABLET (10 MG TOTAL) BY MOUTH AT BEDTIME., Disp: , Rfl: 2 .  Multiple Vitamin  (MULTI-VITAMINS) TABS, Take 1 tablet by mouth daily., Disp: , Rfl:  .  Probiotic Product (PROBIOTIC ADVANCED PO), Take 1 capsule by mouth at bedtime. , Disp: , Rfl:   Allergies  Allergen Reactions  . Atorvastatin Swelling  . Ciprofloxacin Hcl Other (See Comments)    Heart condition  Lowers blood pressure  . Cyclobenzaprine Other (See Comments)    Angina; lowers BP  . Nortriptyline Other (See Comments)    Angina, lowers BP  . Pravastatin Other (See Comments)    gingivitis  . Prednisone Other (See Comments)    Has heart condition causes BP to gt too low.  . Sulfamethizole Hives     ROS  Ten systems reviewed and is negative except as mentioned in HPI  Objective  Filed Vitals:   05/14/16 1434  BP: 116/74  Pulse: 70  Temp: 97.4 F (36.3 C)  TempSrc: Oral  Resp: 16  Height: '5\' 6"'$  (1.676 m)  Weight: 153 lb 12.8 oz (69.763 kg)  SpO2: 98%    Body mass index is 24.84 kg/(m^2).  Physical Exam  Constitutional: Patient appears well-developed and well-nourished. No distress.  HEENT: head atraumatic, normocephalic, pupils equal and reactive to light, normal facial exam,  neck supple, throat within normal limits Cardiovascular: Normal rate, regular rhythm and normal heart sounds.  No murmur heard. No BLE edema. Pulmonary/Chest: Effort normal and breath sounds normal. No respiratory distress. Abdominal: Soft.  There is mild diffuse tenderness.  Psychiatric: Patient has a normal mood and affect. behavior is normal. Judgment and thought content normal.  Recent Results (from the past 2160 hour(s))  Comprehensive metabolic panel     Status: Abnormal   Collection Time: 04/06/16  1:56 PM  Result Value Ref Range   Sodium 140 135 - 145 mmol/L   Potassium 4.0 3.5 - 5.1 mmol/L   Chloride 103 101 - 111 mmol/L   CO2 28 22 - 32 mmol/L   Glucose, Bld 91 65 - 99 mg/dL   BUN 11 6 - 20 mg/dL   Creatinine, Ser 0.64 0.44 - 1.00 mg/dL   Calcium 9.3 8.9 - 10.3 mg/dL   Total Protein 7.3 6.5 -  8.1 g/dL   Albumin 4.3 3.5 - 5.0 g/dL   AST 69 (H) 15 - 41 U/L   ALT 75 (H) 14 - 54 U/L   Alkaline Phosphatase 100 38 - 126 U/L   Total Bilirubin 1.0 0.3 - 1.2 mg/dL   GFR calc non Af Amer >60 >60 mL/min   GFR calc Af Amer >60 >60 mL/min    Comment: (NOTE) The eGFR has been calculated using the CKD EPI equation. This calculation has not been validated in  all clinical situations. eGFR's persistently <60 mL/min signify possible Chronic Kidney Disease.    Anion gap 9 5 - 15  CBC with Differential     Status: None   Collection Time: 04/06/16  1:56 PM  Result Value Ref Range   WBC 8.4 3.6 - 11.0 K/uL   RBC 4.71 3.80 - 5.20 MIL/uL   Hemoglobin 13.4 12.0 - 16.0 g/dL   HCT 39.8 35.0 - 47.0 %   MCV 84.5 80.0 - 100.0 fL   MCH 28.5 26.0 - 34.0 pg   MCHC 33.7 32.0 - 36.0 g/dL   RDW 13.6 11.5 - 14.5 %   Platelets 200 150 - 440 K/uL   Neutrophils Relative % 69 %   Neutro Abs 5.8 1.4 - 6.5 K/uL   Lymphocytes Relative 24 %   Lymphs Abs 2.0 1.0 - 3.6 K/uL   Monocytes Relative 6 %   Monocytes Absolute 0.5 0.2 - 0.9 K/uL   Eosinophils Relative 1 %   Eosinophils Absolute 0.1 0 - 0.7 K/uL   Basophils Relative 0 %   Basophils Absolute 0.0 0 - 0.1 K/uL  CBC     Status: None   Collection Time: 04/07/16  5:05 AM  Result Value Ref Range   WBC 8.0 3.6 - 11.0 K/uL   RBC 4.28 3.80 - 5.20 MIL/uL   Hemoglobin 12.5 12.0 - 16.0 g/dL   HCT 37.3 35.0 - 47.0 %   MCV 87.2 80.0 - 100.0 fL   MCH 29.2 26.0 - 34.0 pg   MCHC 33.4 32.0 - 36.0 g/dL   RDW 13.3 11.5 - 14.5 %   Platelets 170 150 - 440 K/uL  Comprehensive metabolic panel     Status: Abnormal   Collection Time: 04/07/16  5:05 AM  Result Value Ref Range   Sodium 139 135 - 145 mmol/L   Potassium 4.5 3.5 - 5.1 mmol/L   Chloride 106 101 - 111 mmol/L   CO2 27 22 - 32 mmol/L   Glucose, Bld 89 65 - 99 mg/dL   BUN 7 6 - 20 mg/dL   Creatinine, Ser 0.66 0.44 - 1.00 mg/dL   Calcium 8.5 (L) 8.9 - 10.3 mg/dL   Total Protein 6.5 6.5 - 8.1 g/dL    Albumin 3.7 3.5 - 5.0 g/dL   AST 112 (H) 15 - 41 U/L   ALT 150 (H) 14 - 54 U/L   Alkaline Phosphatase 178 (H) 38 - 126 U/L   Total Bilirubin 0.8 0.3 - 1.2 mg/dL   GFR calc non Af Amer >60 >60 mL/min   GFR calc Af Amer >60 >60 mL/min    Comment: (NOTE) The eGFR has been calculated using the CKD EPI equation. This calculation has not been validated in all clinical situations. eGFR's persistently <60 mL/min signify possible Chronic Kidney Disease.    Anion gap 6 5 - 15     PHQ2/9: Depression screen [employer] 2/9 05/14/2016 04/11/2016 12/18/2015 10/03/2015 09/15/2015  Decreased Interest 0 0 0 0 0  Down, Depressed, Hopeless 0 0 0 0 0  PHQ - 2 Score 0 0 0 0 0     Fall Risk: Fall Risk  05/14/2016 04/11/2016 12/18/2015 10/03/2015 09/15/2015  Falls in the past year? No No No No No  Number falls in past yr: - - - - -  Injury with Fall? - - - - -      Functional Status Survey: Is the patient deaf or have difficulty hearing?: No Does the patient have difficulty  seeing, even when wearing glasses/contacts?: No Does the patient have difficulty concentrating, remembering, or making decisions?: No Does the patient have difficulty walking or climbing stairs?: No Does the patient have difficulty dressing or bathing?: No Does the patient have difficulty doing errands alone such as visiting a doctor's office or shopping?: No    Assessment & Plan  1. Diarrhea of presumed infectious origin  - C. difficile GDH and Toxin A/B - metroNIDAZOLE (FLAGYL) 500 MG tablet; Take 1 tablet (500 mg total) by mouth 3 (three) times daily.  Dispense: 30 tablet; Refill: 0  2. Abdominal cramping  - C. difficile GDH and Toxin A/B - metroNIDAZOLE (FLAGYL) 500 MG tablet; Take 1 tablet (500 mg total) by mouth 3 (three) times daily.  Dispense: 30 tablet; Refill: 0  3. Facial cellulitis  resolved

## 2016-05-14 NOTE — Patient Instructions (Signed)
Clostridium Difficile Infection Clostridium difficile (C. difficile or C. diff) is a bacterium normally found in the intestinal tract or colon. C. difficile infection causes diarrhea and sometimes a severe disease called pseudomembranous colitis (C. difficile colitis). C. difficile colitis can damage the lining of the colon or cause the colon to become very large (toxic megacolon). Older adults and people with certain medical conditions have a greater risk of getting C. difficile infections. CAUSES The balance of bacteria in your colon can change when you are sick, especially when taking antibiotic medicine. Taking antibiotics may allow the C. difficile to grow, multiply, and make a toxin that causes C. difficile infection.  SYMPTOMS  Diarrhea.  Fever.  Fatigue.  Loss of appetite.  Nausea.  Abdominal swelling, pain, or tenderness.  Dehydration. DIAGNOSIS Your health care provider may suspect C. difficile infection based on your symptoms and if you have taken antibiotics recently. Your health care provider may also order:  A lab test that can detect the toxin in your stool.  A sigmoidoscopy or colonoscopy to look at the appearance of your colon. These procedures involve passing an instrument through your rectum to look at the inside of your colon. Your health care provider will help determine if these tests are necessary. TREATMENT Treatment may include:  Taking antibiotics that keep C. difficile from growing.  Stopping the antibiotics you were on before the C. difficile infection began. Only do this if instructed to do so by your health care provider.  IV fluids and correction of electrolyte imbalance.  Surgery to remove the infected part of the intestines. This is rare. HOME CARE INSTRUCTIONS  Drink enough fluids to keep your urine clear or pale yellow. Avoid milk, caffeine, and alcohol.  Ask your health care provider for specific rehydration instructions.  Eat small,  frequent meals rather than large meals.  Take your antibiotics as directed. Finish them even if you start to feel better.  Do not use medicines to slow diarrhea. This could delay healing or cause problems.  Wash your hands thoroughly after using the bathroom and before preparing food. Make sure people who live with you wash their hands often, too.  Clean all surfaces with a product that contains chlorine bleach. SEEK MEDICAL CARE IF:  Your diarrhea lasts longer than expected or comes back after you finish your antibiotic medicine for the C. difficile infection.  You have trouble staying hydrated.  You have a fever. SEEK IMMEDIATE MEDICAL CARE IF:  You have increasing abdominal pain or tenderness.  You have blood in your stools, or your stools look dark black and tarry.  You cannot eat or drink without vomiting.   This information is not intended to replace advice given to you by your health care provider. Make sure you discuss any questions you have with your health care provider.   Document Released: 07/24/2005 Document Revised: 11/04/2014 Document Reviewed: 04/17/2015 Elsevier Interactive Patient Education 2016 [employer]  

## 2016-05-18 LAB — C. DIFFICILE GDH AND TOXIN A/B
C. DIFF TOXIN A/B: DETECTED — AB
C. difficile GDH: DETECTED — AB

## 2016-05-20 ENCOUNTER — Encounter: Payer: Self-pay | Admitting: Family Medicine

## 2016-06-24 ENCOUNTER — Ambulatory Visit (INDEPENDENT_AMBULATORY_CARE_PROVIDER_SITE_OTHER): Payer: Federal, State, Local not specified - PPO | Admitting: Family Medicine

## 2016-06-24 ENCOUNTER — Encounter: Payer: Self-pay | Admitting: Family Medicine

## 2016-06-24 VITALS — BP 116/72 | HR 97 | Temp 97.7°F | Resp 16 | Ht 66.0 in | Wt 149.1 lb

## 2016-06-24 DIAGNOSIS — E7849 Other hyperlipidemia: Secondary | ICD-10-CM

## 2016-06-24 DIAGNOSIS — J4520 Mild intermittent asthma, uncomplicated: Secondary | ICD-10-CM

## 2016-06-24 DIAGNOSIS — Z8719 Personal history of other diseases of the digestive system: Secondary | ICD-10-CM

## 2016-06-24 DIAGNOSIS — J309 Allergic rhinitis, unspecified: Secondary | ICD-10-CM | POA: Diagnosis not present

## 2016-06-24 DIAGNOSIS — E663 Overweight: Secondary | ICD-10-CM

## 2016-06-24 DIAGNOSIS — IMO0001 Reserved for inherently not codable concepts without codable children: Secondary | ICD-10-CM

## 2016-06-24 DIAGNOSIS — E785 Hyperlipidemia, unspecified: Secondary | ICD-10-CM

## 2016-06-24 DIAGNOSIS — R748 Abnormal levels of other serum enzymes: Secondary | ICD-10-CM

## 2016-06-24 DIAGNOSIS — J069 Acute upper respiratory infection, unspecified: Secondary | ICD-10-CM

## 2016-06-24 DIAGNOSIS — Z1211 Encounter for screening for malignant neoplasm of colon: Secondary | ICD-10-CM | POA: Diagnosis not present

## 2016-06-24 DIAGNOSIS — J3089 Other allergic rhinitis: Secondary | ICD-10-CM

## 2016-06-24 DIAGNOSIS — K589 Irritable bowel syndrome without diarrhea: Secondary | ICD-10-CM

## 2016-06-24 DIAGNOSIS — Z23 Encounter for immunization: Secondary | ICD-10-CM | POA: Diagnosis not present

## 2016-06-24 DIAGNOSIS — E8881 Metabolic syndrome: Secondary | ICD-10-CM

## 2016-06-24 MED ORDER — ALBUTEROL SULFATE HFA 108 (90 BASE) MCG/ACT IN AERS: 2.0000 | INHALATION_SPRAY | Freq: Four times a day (QID) | RESPIRATORY_TRACT | 0 refills | Status: DC | PRN

## 2016-06-24 MED ORDER — FLUTICASONE FUROATE-VILANTEROL 100-25 MCG/INH IN AEPB: 1.0000 | INHALATION_SPRAY | Freq: Every day | RESPIRATORY_TRACT | 2 refills | Status: DC

## 2016-06-24 NOTE — Progress Notes (Signed)
Name: Lori [name]   MRN: [id number]    DOB: [birth year]   Date:06/24/2016       Progress Note  Subjective  Chief Complaint  Chief Complaint  Patient presents with  . Follow-up    1 month   . Diarrhea    C. Diff positive and was treated on last visit. Patient denies any symptoms and states she is doing well with her weight loss-losing 5 pounds. Patient states she did not eat any of the food in [place].   . Fever    Since coming back from [place] on 06/19/2016 patient has had fever, chills, and coughing up white and cream looking mucus.     HPI  URI: she went to [place] and returned with an URI, fever, chills, productive cough.. She is feeling better now, but because of her asthma she still has a dry cough and mild wheezing over the past couple of days.   C.difi colitis: diarrhea resolved with antibiotics, no abdominal pain , vomiting, no blood in stools. She has mild nausea since she developed the URI   IBS: stable at this time, no abdominal cramping  AR: seeing ENT, medication does not seem to help , explained likely worse because of symptoms of URI and returning to [place] from [place]  Family hyperlipidemia: she chose to only follow a healthy diet, not on statin therapy, LDL was 219  In December 2017. She has a family history of MI - father died age 95 from MI, her brother's all had MI' in their 44's  Elevated LFT"s : we will recheck labs, denies jaundice, mild nausea now  Patient Active Problem List   Diagnosis Date Noted  . Facial cellulitis 04/06/2016  . Dental infection 04/06/2016  . Neck pain 04/06/2016  . BPPV (benign paroxysmal positional vertigo) 02/22/2016  . Perennial allergic rhinitis 02/22/2016  . Metabolic syndrome 62/70/3500  . Eustachian tube dysfunction 09/15/2015  . Asthma, mild intermittent, poorly controlled 09/15/2015  . Cervical spondylosis 08/29/2015  . Overweight (BMI 25.0-29.9) 06/29/2015  . IBS (irritable bowel syndrome) 06/29/2015  . Right flank  pain 06/22/2015  . 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 Cervical radiculitis 07/19/2014  . 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 Dysautonomia 10/01/2012  . History of syncope 07/07/2012  . Depression with anxiety 06/11/2011  . Dyslipidemia 11/02/2010    Past Surgical History:  Procedure Laterality Date  . APPENDECTOMY    . CESAREAN SECTION     x2  . CHOLECYSTECTOMY    . neurocardiogenic syncope    . PATENT FORAMEN OVALE CLOSURE    . SURGERY ON FACE     AFTER FALL  . WRIST SURGERY     broken wrist,left    Family History  Problem Relation Age of Onset  . Hypertension Mother   . Heart disease Father 3    MI    Social History   Social History  . Marital status: Married    Spouse name: N/A  . Number of children: N/A  . Years of education: N/A   Occupational History  . Not on file.   Social History Main Topics  . Smoking status: Former Smoker    Packs/day: 1.00    Years: 5.00    Types: Cigarettes    Quit date: 02/11/2006  . Smokeless tobacco: Never Used  . Alcohol use No  . Drug use: No  . Sexual activity: Yes    Partners: Male   Other Topics Concern  . Not  on file   Social History Narrative  . No narrative on file     Current Outpatient Prescriptions:  .  Acetaminophen-Codeine (TYLENOL/CODEINE #3) 300-30 MG tablet, Take 1 tablet by mouth every 4 (four) hours as needed for pain., Disp: 30 tablet, Rfl: 0 .  acyclovir (ZOVIRAX) 800 MG tablet, TAKE 1 TABLET BY ORAL ROUTE 2 TIMES A DAY FOR 5 DAYS, Disp: , Rfl: 5 .  acyclovir ointment (ZOVIRAX) 5 %, Apply 1 application topically every 3 (three) hours., Disp: 30 g, Rfl: 0 .  azelastine (ASTELIN) 0.1 % nasal spray, Place 1-2 sprays into both nostrils 2 (two) times daily., Disp: , Rfl: 12 .  B Complex Vitamins (VITAMIN B COMPLEX PO), Take 1 tablet by mouth daily., Disp: , Rfl:  .  fluticasone (FLONASE) 50 MCG/ACT nasal spray, USE 2 SPRAYS IN EACH NOSTRL DAILY, Disp: ,  Rfl: 12 .  Milk Thistle 500 MG CAPS, Take 1,000 mg by mouth every morning., Disp: , Rfl:  .  montelukast (SINGULAIR) 10 MG tablet, TAKE 1 TABLET (10 MG TOTAL) BY MOUTH AT BEDTIME., Disp: , Rfl: 2 .  Multiple Vitamin (MULTI-VITAMINS) TABS, Take 1 tablet by mouth daily., Disp: , Rfl:  .  Probiotic Product (PROBIOTIC ADVANCED PO), Take 1 capsule by mouth at bedtime. , Disp: , Rfl:   Allergies  Allergen Reactions  . Atorvastatin Swelling  . Ciprofloxacin Hcl Other (See Comments)    Heart condition  Lowers blood pressure  . Cyclobenzaprine Other (See Comments)    Angina; lowers BP  . Nortriptyline Other (See Comments)    Angina, lowers BP  . Pravastatin Other (See Comments)    gingivitis  . Prednisone Other (See Comments)    Has heart condition causes BP to gt too low.  . Sulfamethizole Hives     ROS  Constitutional: Negative for fever, positive for  weight change - changed her diet.  Respiratory: Negative for cough and shortness of breath.   Cardiovascular: Negative for chest pain or palpitations.  Gastrointestinal: Negative for abdominal pain, no bowel changes.  Musculoskeletal: Negative for gait problem or joint swelling.  Skin: Negative for rash.  Neurological: Negative for dizziness or headache.  No other specific complaints in a complete review of systems (except as listed in HPI above).  Objective  Vitals:   06/24/16 1108  BP: 116/72  Pulse: 97  Resp: 16  Temp: 97.7 F (36.5 C)  TempSrc: Oral  SpO2: 97%  Weight: 149 lb 1.6 oz (67.6 kg)  Height: '5\' 6"'$  (1.676 m)    Body mass index is 24.07 kg/m.  Physical Exam  Constitutional: Patient appears well-developed and well-nourished. No distress.  HEENT: head atraumatic, normocephalic, pupils equal and reactive to light, neck supple, throat within normal limits Cardiovascular: Normal rate, regular rhythm and normal heart sounds.  No murmur heard. No BLE edema. Pulmonary/Chest: Effort normal and breath sounds normal.  No respiratory distress. Abdominal: Soft.  There is no tenderness. Psychiatric: Patient has a normal mood and affect. behavior is normal. Judgment and thought content normal.  Recent Results (from the past 2160 hour(s))  Comprehensive metabolic panel     Status: Abnormal   Collection Time: 04/06/16  1:56 PM  Result Value Ref Range   Sodium 140 135 - 145 mmol/L   Potassium 4.0 3.5 - 5.1 mmol/L   Chloride 103 101 - 111 mmol/L   CO2 28 22 - 32 mmol/L   Glucose, Bld 91 65 - 99 mg/dL   BUN 11 6 -  20 mg/dL   Creatinine, Ser 0.64 0.44 - 1.00 mg/dL   Calcium 9.3 8.9 - 10.3 mg/dL   Total Protein 7.3 6.5 - 8.1 g/dL   Albumin 4.3 3.5 - 5.0 g/dL   AST 69 (H) 15 - 41 U/L   ALT 75 (H) 14 - 54 U/L   Alkaline Phosphatase 100 38 - 126 U/L   Total Bilirubin 1.0 0.3 - 1.2 mg/dL   GFR calc non Af Amer >60 >60 mL/min   GFR calc Af Amer >60 >60 mL/min    Comment: (NOTE) The eGFR has been calculated using the CKD EPI equation. This calculation has not been validated in all clinical situations. eGFR's persistently <60 mL/min signify possible Chronic Kidney Disease.    Anion gap 9 5 - 15  CBC with Differential     Status: None   Collection Time: 04/06/16  1:56 PM  Result Value Ref Range   WBC 8.4 3.6 - 11.0 K/uL   RBC 4.71 3.80 - 5.20 MIL/uL   Hemoglobin 13.4 12.0 - 16.0 g/dL   HCT 39.8 35.0 - 47.0 %   MCV 84.5 80.0 - 100.0 fL   MCH 28.5 26.0 - 34.0 pg   MCHC 33.7 32.0 - 36.0 g/dL   RDW 13.6 11.5 - 14.5 %   Platelets 200 150 - 440 K/uL   Neutrophils Relative % 69 %   Neutro Abs 5.8 1.4 - 6.5 K/uL   Lymphocytes Relative 24 %   Lymphs Abs 2.0 1.0 - 3.6 K/uL   Monocytes Relative 6 %   Monocytes Absolute 0.5 0.2 - 0.9 K/uL   Eosinophils Relative 1 %   Eosinophils Absolute 0.1 0 - 0.7 K/uL   Basophils Relative 0 %   Basophils Absolute 0.0 0 - 0.1 K/uL  CBC     Status: None   Collection Time: 04/07/16  5:05 AM  Result Value Ref Range   WBC 8.0 3.6 - 11.0 K/uL   RBC 4.28 3.80 - 5.20 MIL/uL    Hemoglobin 12.5 12.0 - 16.0 g/dL   HCT 37.3 35.0 - 47.0 %   MCV 87.2 80.0 - 100.0 fL   MCH 29.2 26.0 - 34.0 pg   MCHC 33.4 32.0 - 36.0 g/dL   RDW 13.3 11.5 - 14.5 %   Platelets 170 150 - 440 K/uL  Comprehensive metabolic panel     Status: Abnormal   Collection Time: 04/07/16  5:05 AM  Result Value Ref Range   Sodium 139 135 - 145 mmol/L   Potassium 4.5 3.5 - 5.1 mmol/L   Chloride 106 101 - 111 mmol/L   CO2 27 22 - 32 mmol/L   Glucose, Bld 89 65 - 99 mg/dL   BUN 7 6 - 20 mg/dL   Creatinine, Ser 0.66 0.44 - 1.00 mg/dL   Calcium 8.5 (L) 8.9 - 10.3 mg/dL   Total Protein 6.5 6.5 - 8.1 g/dL   Albumin 3.7 3.5 - 5.0 g/dL   AST 112 (H) 15 - 41 U/L   ALT 150 (H) 14 - 54 U/L   Alkaline Phosphatase 178 (H) 38 - 126 U/L   Total Bilirubin 0.8 0.3 - 1.2 mg/dL   GFR calc non Af Amer >60 >60 mL/min   GFR calc Af Amer >60 >60 mL/min    Comment: (NOTE) The eGFR has been calculated using the CKD EPI equation. This calculation has not been validated in all clinical situations. eGFR's persistently <60 mL/min signify possible Chronic Kidney Disease.    Anion  gap 6 5 - 15  C. difficile GDH and Toxin A/B     Status: Abnormal   Collection Time: 05/17/16  1:55 PM  Result Value Ref Range   C. difficile GDH Detected (A)    C. difficile Toxin A/B Detected (A)     Comment: Toxigenic C. difficile Detected   Source Stool      PHQ2/9: Depression screen [facility] 2/9 05/14/2016 04/11/2016 12/18/2015 10/03/2015 09/15/2015  Decreased Interest 0 0 0 0 0  Down, Depressed, Hopeless 0 0 0 0 0  PHQ - 2 Score 0 0 0 0 0     Fall Risk: Fall Risk  05/14/2016 04/11/2016 12/18/2015 10/03/2015 09/15/2015  Falls in the past year? No No No No No  Number falls in past yr: - - - - -  Injury with Fall? - - - - -     Assessment & Plan  1. Asthma, mild intermittent, poorly controlled  - albuterol (PROAIR HFA) 108 (90 Base) MCG/ACT inhaler; Inhale 2 puffs into the lungs every 6 (six) hours as needed for wheezing or  shortness of breath.  Dispense: 1 Inhaler; Refill: 0 - fluticasone furoate-vilanterol (BREO ELLIPTA) 100-25 MCG/INH AEPB; Inhale 1 puff into the lungs daily.  Dispense: 60 each; Refill: 2  2. Metabolic syndrome  -[id number]  3. Overweight (BMI 25.0-29.9)  Doing well with life style modification   4. IBS (irritable bowel syndrome)  stable  5. Colon cancer screening  - Ambulatory referral to Gastroenterology  6. Perennial allergic rhinitis  Continue follow up ENT  7. Familial hyperlipidemia, high LDL  - Lipid panel  8. Elevated liver enzymes  - COMPLETE METABOLIC PANEL WITH GFR  9. Upper respiratory infection  Improving   10. History of Clostridium difficile  Resolved

## 2016-06-24 NOTE — Addendum Note (Signed)
Addended by: Inda Coke on: 06/24/2016 11:52 AM   Modules accepted: Orders

## 2016-06-26 ENCOUNTER — Telehealth: Payer: Self-pay

## 2016-06-26 NOTE — Telephone Encounter (Signed)
Patient has an appointment to see her cardiologist tomorrow to make sure that it is okay for her to be under anesthesia. Patient will call me back tomorrow.  She needs to be scheduled for a screening colonoscopy. Please update referral once the colonoscopy has been scheduled.

## 2016-06-27 ENCOUNTER — Encounter: Payer: Self-pay | Admitting: Internal Medicine

## 2016-06-27 ENCOUNTER — Ambulatory Visit (INDEPENDENT_AMBULATORY_CARE_PROVIDER_SITE_OTHER): Payer: Federal, State, Local not specified - PPO | Admitting: Internal Medicine

## 2016-06-27 VITALS — BP 110/80 | HR 66 | Ht 66.0 in | Wt 150.8 lb

## 2016-06-27 DIAGNOSIS — G909 Disorder of the autonomic nervous system, unspecified: Secondary | ICD-10-CM

## 2016-06-27 DIAGNOSIS — R002 Palpitations: Secondary | ICD-10-CM | POA: Diagnosis not present

## 2016-06-27 DIAGNOSIS — G901 Familial dysautonomia [Riley-Day]: Secondary | ICD-10-CM

## 2016-06-27 NOTE — Patient Instructions (Signed)
Medication Instructions: - Your physician recommends that you continue on your current medications as directed. Please refer to the Current Medication list given to you today.  Labwork: - none  Procedures/Testing: - none  Follow-Up: - Your physician wants you to follow-up in: June 2018 with [name]. You will receive a reminder letter in the mail two months in advance. If you don't receive a letter, please call our office to schedule the follow-up appointment.  Any Additional Special Instructions Will Be Listed Below (If Applicable).     If you need a refill on your cardiac medications before your next appointment, please call your pharmacy.

## 2016-06-27 NOTE — Progress Notes (Signed)
Patient Care Team: Steele Sizer, MD as PCP - General (Family Medicine)   HPI  Lori [name] is a 50 y.o. female Seen in followup for palpitations in the context of what I thought on evaluation December 2013 was dysautonomia. She had accompanying lightheadedness residual fatigue and chest discomfort.prior evaluation had also suggested that she had a "hole in the heart". Recent echocardiogram did not support that diagnosis. Event recorder previously had demonstrated PVCs.    She has begun her m enopause and is now without bleding   The summer was much easier, she has been able to exercise and has lost 25 lbs  She has had no lightheadedness  Overall however, from an autonomic perspective she is feeling much better than she has in a long time. Apparently the citalopram had been beneficial; anxiety and depression have been much better.  She is now off      Past Medical History:  Diagnosis Date  . Asthma   . Cancer, uterine ([place]) 1998  . Chest pain    normal LV function by echo 10/2010  . High cholesterol   . 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 Kidney stone   . Palpitations     Past Surgical History:  Procedure Laterality Date  . APPENDECTOMY    . CESAREAN SECTION     x2  . CHOLECYSTECTOMY    . neurocardiogenic syncope    . PATENT FORAMEN OVALE CLOSURE    . SURGERY ON FACE     AFTER FALL  . WRIST SURGERY     broken wrist,left    Current Outpatient Prescriptions  Medication Sig Dispense Refill  . Acetaminophen-Codeine (TYLENOL/CODEINE #3) 300-30 MG tablet Take 1 tablet by mouth every 4 (four) hours as needed for pain. 30 tablet 0  . acyclovir (ZOVIRAX) 800 MG tablet TAKE 1 TABLET BY ORAL ROUTE 2 TIMES A DAY FOR 5 DAYS  5  . acyclovir ointment (ZOVIRAX) 5 % Apply 1 application topically every 3 (three) hours. 30 g 0  . albuterol (PROAIR HFA) 108 (90 Base) MCG/ACT inhaler Inhale 2 puffs into the lungs every 6 (six) hours as needed for wheezing or shortness of breath. 1  Inhaler 0  . azelastine (ASTELIN) 0.1 % nasal spray Place 1-2 sprays into both nostrils 2 (two) times daily.  12  . B Complex Vitamins (VITAMIN B COMPLEX PO) Take 1 tablet by mouth daily.    . fluticasone (FLONASE) 50 MCG/ACT nasal spray USE 2 SPRAYS IN EACH NOSTRL DAILY  12  . fluticasone furoate-vilanterol (BREO ELLIPTA) 100-25 MCG/INH AEPB Inhale 1 puff into the lungs daily. 60 each 2  . Milk Thistle 500 MG CAPS Take 1,000 mg by mouth every morning.    . montelukast (SINGULAIR) 10 MG tablet TAKE 1 TABLET (10 MG TOTAL) BY MOUTH AT BEDTIME.  2  . Multiple Vitamin (MULTI-VITAMINS) TABS Take 1 tablet by mouth daily.    . Probiotic Product (PROBIOTIC ADVANCED PO) Take 1 capsule by mouth at bedtime.      No current facility-administered medications for this visit.     Allergies  Allergen Reactions  . Atorvastatin Swelling  . Ciprofloxacin Hcl Other (See Comments)    Heart condition  Lowers blood pressure  . Cyclobenzaprine Other (See Comments)    Angina; lowers BP  . Nortriptyline Other (See Comments)    Angina, lowers BP  . Pravastatin Other (See Comments)    gingivitis  . Prednisone Other (See Comments)    Has heart  condition causes BP to gt too low.  Francine Graven Hives    Review of Systems negative except from HPI and PMH  Physical Exam Ht 5\' 6"  (1.676 m)   Wt 150 lb 12 oz (68.4 kg)   BMI 24.33 kg/m  Well developed and well nourished in no acute distress HENT normal E scleral and icterus clear Neck Supple JVP flat; carotids brisk and full Clear to ausculation  *Regular rate and rhythm, no murmurs gallops or rub Soft with active bowel sounds No clubbing cyanosis  Edema Alert and oriented, grossly normal motor and sensory function Skin Warm and Dry  ECG demonstrates sinus rhythm   Assessment and  Plan  Dysautonomia  Palpitations  anxiety    She is doing as well as I have seen her for some time. We will continue her current medications.  We continue to  stress the importance of exercise and hydration   Her menopause has helped immensel

## 2016-08-13 ENCOUNTER — Emergency Department
Admission: EM | Admit: 2016-08-13 | Discharge: 2016-08-13 | Disposition: A | Discharge status: Home / Self Care | Payer: Federal, State, Local not specified - PPO | Attending: Emergency Medicine | Admitting: Emergency Medicine

## 2016-08-13 ENCOUNTER — Emergency Department: Payer: Federal, State, Local not specified - PPO

## 2016-08-13 DIAGNOSIS — Z87891 Personal history of nicotine dependence: Secondary | ICD-10-CM | POA: Insufficient documentation

## 2016-08-13 DIAGNOSIS — Z7951 Long term (current) use of inhaled steroids: Secondary | ICD-10-CM | POA: Diagnosis not present

## 2016-08-13 DIAGNOSIS — J4520 Mild intermittent asthma, uncomplicated: Secondary | ICD-10-CM | POA: Diagnosis not present

## 2016-08-13 DIAGNOSIS — Y999 Unspecified external cause status: Secondary | ICD-10-CM | POA: Insufficient documentation

## 2016-08-13 DIAGNOSIS — J45909 Unspecified asthma, uncomplicated: Secondary | ICD-10-CM | POA: Diagnosis not present

## 2016-08-13 DIAGNOSIS — Z79899 Other long term (current) drug therapy: Secondary | ICD-10-CM | POA: Insufficient documentation

## 2016-08-13 DIAGNOSIS — M545 Low back pain: Secondary | ICD-10-CM | POA: Insufficient documentation

## 2016-08-13 DIAGNOSIS — S161XXA Strain of muscle, fascia and tendon at neck level, initial encounter: Secondary | ICD-10-CM | POA: Diagnosis not present

## 2016-08-13 DIAGNOSIS — Y92410 Unspecified street and highway as the place of occurrence of the external cause: Secondary | ICD-10-CM | POA: Insufficient documentation

## 2016-08-13 DIAGNOSIS — Y9389 Activity, other specified: Secondary | ICD-10-CM | POA: Diagnosis not present

## 2016-08-13 DIAGNOSIS — Z8542 Personal history of malignant neoplasm of other parts of uterus: Secondary | ICD-10-CM | POA: Diagnosis not present

## 2016-08-13 DIAGNOSIS — S199XXA Unspecified injury of neck, initial encounter: Secondary | ICD-10-CM | POA: Diagnosis present

## 2016-08-13 MED ORDER — ACETAMINOPHEN 500 MG PO TABS
1000.0000 mg | ORAL_TABLET | Freq: Once | ORAL | Status: AC
  Administered 2016-08-13: 1000 mg via ORAL
  Filled 2016-08-13: qty 2

## 2016-08-13 MED ORDER — TRAMADOL HCL 50 MG PO TABS: 50.0000 mg | ORAL_TABLET | Freq: Two times a day (BID) | ORAL | 0 refills | Status: DC | PRN

## 2016-08-13 MED ORDER — DIAZEPAM 2 MG PO TABS
2.0000 mg | ORAL_TABLET | Freq: Once | ORAL | Status: AC
  Administered 2016-08-13: 2 mg via ORAL
  Filled 2016-08-13: qty 1

## 2016-08-13 MED ORDER — DIAZEPAM 2 MG PO TABS: 2.0000 mg | ORAL_TABLET | Freq: Three times a day (TID) | ORAL | 0 refills | Status: DC | PRN
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
## 2016-08-13 NOTE — ED Notes (Signed)

## 2016-08-13 NOTE — Discharge Instructions (Signed)
Your exam and x-rays are normal. Take the prescription meds as needed for pain and muscle spasm, Take OTC Tylenol for non-drowsy pain relief. Apply ice to any sore muscles as needed. Follow-up with Dr. Ancil Boozer for ongoing pain relief.  ############################################################# Sus examenes y Radio producer. Tome los medicamentos recetados segn sean necesarios para Conservation officer, historic buildings y espasmos musculares. Tome Tylenol OTC para Best boy. Aplique hielo en los msculos (partes) adoloridos segn sea necesario. Hacer un seguimiento con el Dr. Ancil Boozer si [facility].
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
## 2016-09-03 ENCOUNTER — Ambulatory Visit (INDEPENDENT_AMBULATORY_CARE_PROVIDER_SITE_OTHER): Payer: Federal, State, Local not specified - PPO | Admitting: Family Medicine

## 2016-09-25 ENCOUNTER — Encounter: Payer: Self-pay | Admitting: Family Medicine

## 2016-09-25 ENCOUNTER — Ambulatory Visit (INDEPENDENT_AMBULATORY_CARE_PROVIDER_SITE_OTHER): Payer: Federal, State, Local not specified - PPO | Admitting: Family Medicine

## 2016-09-25 VITALS — BP 110/60 | HR 80 | Temp 97.6°F | Resp 16 | Ht 66.0 in | Wt 146.7 lb

## 2016-09-25 DIAGNOSIS — R748 Abnormal levels of other serum enzymes: Secondary | ICD-10-CM | POA: Diagnosis not present

## 2016-09-25 DIAGNOSIS — E8881 Metabolic syndrome: Secondary | ICD-10-CM

## 2016-09-25 DIAGNOSIS — E7849 Other hyperlipidemia: Secondary | ICD-10-CM

## 2016-09-25 DIAGNOSIS — E784 Other hyperlipidemia: Secondary | ICD-10-CM

## 2016-09-25 DIAGNOSIS — R1011 Right upper quadrant pain: Secondary | ICD-10-CM

## 2016-09-25 DIAGNOSIS — R110 Nausea: Secondary | ICD-10-CM | POA: Diagnosis not present

## 2016-09-25 NOTE — Addendum Note (Signed)
Addended by: Steele Sizer F on: 09/25/2016 09:18 AM   Modules accepted: Orders

## 2016-09-25 NOTE — Progress Notes (Signed)
Name: Lori [name]   MRN: [id number]    DOB: 09-28-1966   Date:09/25/2016       Progress Note  Subjective  Chief Complaint  Chief Complaint  Patient presents with  . Abdominal Pain    Patient states the past week she has had URQ pain. No bowel movements for the past 2 days, and unable to eat due to being very painful.     HPI  RUQ pain: she had cholecystectomy around 2002, she had a similar episode of RUQ/flank pain back in 2015. She went to [facility] and had a normal CT scan abdominal and renal US only showed some small peripelvic cysts. She was given pain medication and symptoms resolved. She was hospitalized in June for tooth abscess and liver enzymes spiked during her stay, but never went back to recheck labs. She states this episode of RUQ pain started one week ago, suddenly after she ate, it has been a constant dull ache/feels like gas being trapped. Pain is worse after she eats. Pain is better when laying down, also improves with hot compresses. Pain radiates to her back. She states it is the same pain she had when she had acute cholecystitis. No fever or chills. She has decrease in appetite. She has noticed constipation over the past couple of days - but she is not eating. No hematuria, urinary frequency or dysuria.   Metabolic Syndrome: denies polyphagia, polydipsia or polyuria  Dyslipidemia: due for labs, not on statin therapy  Asthma: she stopped all her mediations, but denies SOB or wheezing or cough   Patient Active Problem List   Diagnosis Date Noted  . Facial cellulitis 04/06/2016  . Dental infection 04/06/2016  . Neck pain 04/06/2016  . BPPV (benign paroxysmal positional vertigo) 02/22/2016  . Perennial allergic rhinitis 02/22/2016  . Metabolic syndrome 0000000  . Eustachian tube dysfunction 09/15/2015  . Asthma, mild intermittent, poorly controlled 09/15/2015  . Cervical spondylosis 08/29/2015  . Overweight (BMI 25.0-29.9) 06/29/2015  . IBS (irritable bowel syndrome)  06/29/2015  . 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 Cervical radiculitis 07/19/2014  . 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 Dysautonomia 10/01/2012  . History of syncope 07/07/2012  . Depression with anxiety 06/11/2011  . Dyslipidemia 11/02/2010    Past Surgical History:  Procedure Laterality Date  . APPENDECTOMY    . CESAREAN SECTION     x2  . CHOLECYSTECTOMY    . neurocardiogenic syncope    . PATENT FORAMEN OVALE CLOSURE    . SURGERY ON FACE     AFTER FALL  . WRIST SURGERY     broken wrist,left    Family History  Problem Relation Age of Onset  . Hypertension Mother   . Heart disease Father 22    MI    Social History   Social History  . Marital status: Married    Spouse name: N/A  . Number of children: N/A  . Years of education: N/A   Occupational History  . Not on file.   Social History Main Topics  . Smoking status: Former Smoker    Packs/day: 1.00    Years: 5.00    Types: Cigarettes    Quit date: 02/11/2006  . Smokeless tobacco: Never Used  . Alcohol use No  . Drug use: No  . Sexual activity: Yes    Partners: Male   Other Topics Concern  . Not on file   Social History Narrative  . No narrative on file  Current Outpatient Prescriptions:  .  Azelastine-Fluticasone (DYMISTA) 137-50 MCG/ACT SUSP, Place 1 spray into the nose daily., Disp: , Rfl:  .  Cinnamon Bark POWD, Take by mouth., Disp: , Rfl:  .  acyclovir (ZOVIRAX) 800 MG tablet, TAKE 1 TABLET BY ORAL ROUTE 2 TIMES A DAY FOR 5 DAYS, Disp: , Rfl: 5 .  acyclovir ointment (ZOVIRAX) 5 %, Apply 1 application topically every 3 (three) hours. (Patient not taking: Reported on 09/25/2016), Disp: 30 g, Rfl: 0 .  albuterol (PROAIR HFA) 108 (90 Base) MCG/ACT inhaler, Inhale 2 puffs into the lungs every 6 (six) hours as needed for wheezing or shortness of breath. (Patient not taking: Reported on 09/25/2016), Disp: 1 Inhaler, Rfl: 0 .  B Complex Vitamins  (VITAMIN B COMPLEX PO), Take 1 tablet by mouth daily., Disp: , Rfl:  .  Milk Thistle 500 MG CAPS, Take 1,000 mg by mouth every morning., Disp: , Rfl:  .  Multiple Vitamin (MULTI-VITAMINS) TABS, Take 1 tablet by mouth daily., Disp: , Rfl:  .  Probiotic Product (PROBIOTIC ADVANCED PO), Take 1 capsule by mouth at bedtime. , Disp: , Rfl:   Allergies  Allergen Reactions  . Atorvastatin Swelling  . Ciprofloxacin Hcl Other (See Comments)    Heart condition  Lowers blood pressure  . Cyclobenzaprine Other (See Comments)    Angina; lowers BP  . Nortriptyline Other (See Comments)    Angina, lowers BP  . Pravastatin Other (See Comments)    gingivitis  . Prednisone Other (See Comments)    Has heart condition causes BP to gt too low.  . Sulfamethizole Hives     ROS  Constitutional: Negative for fever or weight change.  Respiratory: Negative for cough and shortness of breath.   Cardiovascular: Negative for chest pain or palpitations.  Gastrointestinal: positive for abdominal pain, no bowel changes.  Musculoskeletal: Negative for gait problem or joint swelling.  Skin: Negative for rash.  Neurological: Negative for dizziness or headache.  No other specific complaints in a complete review of systems (except as listed in HPI above).  Objective  Vitals:   09/25/16 0833  BP: 110/60  Pulse: 80  Resp: 16  Temp: 97.6 F (36.4 C)  TempSrc: Oral  SpO2: 98%  Weight: 146 lb 11.2 oz (66.5 kg)  Height: 5\' 6"  (1.676 m)    Body mass index is 23.68 kg/m.  Physical Exam  Constitutional: Patient appears well-developed and well-nourished.  No distress.  HEENT: head atraumatic, normocephalic, pupils equal and reactive to light,neck supple, throat within normal limits Cardiovascular: Normal rate, regular rhythm and normal heart sounds.  No murmur heard. No BLE edema. Pulmonary/Chest: Effort normal and breath sounds normal. No respiratory distress. Abdominal: Soft.  There is  Tenderness RUQ, no   guarding, no rebound, normal bowel sounds, mild pain during percussion of back also- radiates to the front. Psychiatric: Patient has a normal mood and affect. behavior is normal. Judgment and thought content normal.   PHQ2/9: Depression screen [facility] 2/9 09/25/2016 05/14/2016 04/11/2016 12/18/2015 10/03/2015  Decreased Interest 0 0 0 0 0  Down, Depressed, Hopeless 0 0 0 0 0  PHQ - 2 Score 0 0 0 0 0     Fall Risk: Fall Risk  09/25/2016 05/14/2016 04/11/2016 12/18/2015 10/03/2015  Falls in the past year? No No No No No  Number falls in past yr: - - - - -  Injury with Fall? - - - - -     Functional Status Survey: Is the patient  deaf or have difficulty hearing?: No Does the patient have difficulty seeing, even when wearing glasses/contacts?: No Does the patient have difficulty concentrating, remembering, or making decisions?: No Does the patient have difficulty walking or climbing stairs?: No Does the patient have difficulty dressing or bathing?: No Does the patient have difficulty doing errands alone such as visiting a doctor's office or shopping?: No    Assessment & Plan  1. Right upper quadrant abdominal pain  - Ambulatory referral to Gastroenterology - US Abdomen Limited RUQ; Future - CBC with Differential/Platelet  2. Nausea  She does not want any medication, including no nausea medication  - Ambulatory referral to Gastroenterology - US Abdomen Limited RUQ; Future - CBC with Differential/Platelet  3. Elevated liver enzymes  - Ambulatory referral to Gastroenterology - US Abdomen Limited RUQ; Future - COMPLETE METABOLIC PANEL WITH GFR - CBC with Differential/Platelet  4. Metabolic syndrome  - Hemoglobin A1c  5. Familial hyperlipidemia, high LDL  - Lipid panel

## 2016-09-26 ENCOUNTER — Other Ambulatory Visit: Payer: Self-pay | Admitting: Family Medicine

## 2016-09-27 ENCOUNTER — Other Ambulatory Visit: Payer: Self-pay | Admitting: Family Medicine

## 2016-09-27 ENCOUNTER — Ambulatory Visit
Admission: RE | Admit: 2016-09-27 | Discharge: 2016-09-27 | Disposition: A | Discharge status: Home / Self Care | Payer: Federal, State, Local not specified - PPO | Source: Ambulatory Visit | Attending: Family Medicine | Admitting: Family Medicine

## 2016-09-27 DIAGNOSIS — R1011 Right upper quadrant pain: Secondary | ICD-10-CM | POA: Insufficient documentation

## 2016-09-27 DIAGNOSIS — R110 Nausea: Secondary | ICD-10-CM | POA: Insufficient documentation

## 2016-09-27 DIAGNOSIS — Z9049 Acquired absence of other specified parts of digestive tract: Secondary | ICD-10-CM | POA: Insufficient documentation

## 2016-09-27 DIAGNOSIS — R748 Abnormal levels of other serum enzymes: Secondary | ICD-10-CM | POA: Diagnosis present

## 2016-09-27 LAB — COMPREHENSIVE METABOLIC PANEL
ALT: 91 IU/L — AB (ref 0–32)
AST: 40 IU/L (ref 0–40)
Albumin/Globulin Ratio: 2 (ref 1.2–2.2)
Albumin: 4.9 g/dL (ref 3.5–5.5)
Alkaline Phosphatase: 115 IU/L (ref 39–117)
BILIRUBIN TOTAL: 0.6 mg/dL (ref 0.0–1.2)
BUN/Creatinine Ratio: 20 (ref 9–23)
BUN: 15 mg/dL (ref 6–24)
CHLORIDE: 101 mmol/L (ref 96–106)
CO2: 26 mmol/L (ref 18–29)
CREATININE: 0.75 mg/dL (ref 0.57–1.00)
Calcium: 9.5 mg/dL (ref 8.7–10.2)
GFR calc non Af Amer: 93 mL/min/{1.73_m2} (ref >59)
GFR, EST AFRICAN AMERICAN: 107 mL/min/{1.73_m2} (ref >59)
GLUCOSE: 98 mg/dL (ref 65–99)
Globulin, Total: 2.4 g/dL (ref 1.5–4.5)
Potassium: 4.1 mmol/L (ref 3.5–5.2)
Sodium: 142 mmol/L (ref 134–144)
TOTAL PROTEIN: 7.3 g/dL (ref 6.0–8.5)

## 2016-09-27 LAB — CBC WITH DIFFERENTIAL/PLATELET
BASOS ABS: 0 10*3/uL (ref 0.0–0.2)
Basos: 0 %
EOS (ABSOLUTE): 0.1 10*3/uL (ref 0.0–0.4)
Eos: 2 %
Hematocrit: 45.6 % (ref 34.0–46.6)
Hemoglobin: 15.2 g/dL (ref 11.1–15.9)
IMMATURE GRANS (ABS): 0 10*3/uL (ref 0.0–0.1)
IMMATURE GRANULOCYTES: 0 %
LYMPHS: 35 %
Lymphocytes Absolute: 2.2 10*3/uL (ref 0.7–3.1)
MCH: 29 pg (ref 26.6–33.0)
MCHC: 33.3 g/dL (ref 31.5–35.7)
MCV: 87 fL (ref 79–97)
MONOCYTES: 6 %
Monocytes Absolute: 0.4 10*3/uL (ref 0.1–0.9)
NEUTROS ABS: 3.6 10*3/uL (ref 1.4–7.0)
NEUTROS PCT: 57 %
PLATELETS: 245 10*3/uL (ref 150–379)
RBC: 5.24 x10E6/uL (ref 3.77–5.28)
RDW: 13.3 % (ref 12.3–15.4)
WBC: 6.4 10*3/uL (ref 3.4–10.8)

## 2016-09-27 LAB — LIPID PANEL WITH LDL/HDL RATIO
CHOLESTEROL TOTAL: 256 mg/dL — AB (ref 100–199)
HDL: 53 mg/dL (ref >39)
LDL CALC: 180 mg/dL — AB (ref 0–99)
LDl/HDL Ratio: 3.4 ratio units — ABNORMAL HIGH (ref 0.0–3.2)
Triglycerides: 115 mg/dL (ref 0–149)
VLDL CHOLESTEROL CAL: 23 mg/dL (ref 5–40)

## 2016-09-27 LAB — INSULIN, RANDOM: INSULIN: 7.4 u[IU]/mL (ref 2.6–24.9)

## 2016-09-27 LAB — HGB A1C W/O EAG: Hgb A1c MFr Bld: 5.6 % (ref 4.8–5.6)

## 2016-10-22 ENCOUNTER — Encounter: Payer: Self-pay | Admitting: Gastroenterology

## 2016-10-22 ENCOUNTER — Ambulatory Visit (INDEPENDENT_AMBULATORY_CARE_PROVIDER_SITE_OTHER): Payer: Federal, State, Local not specified - PPO | Admitting: Gastroenterology

## 2016-10-22 VITALS — BP 124/73 | HR 62 | Temp 97.7°F | Ht 66.0 in | Wt 149.4 lb

## 2016-10-22 DIAGNOSIS — R7989 Other specified abnormal findings of blood chemistry: Secondary | ICD-10-CM

## 2016-10-22 DIAGNOSIS — Z1211 Encounter for screening for malignant neoplasm of colon: Secondary | ICD-10-CM

## 2016-10-22 DIAGNOSIS — R1011 Right upper quadrant pain: Secondary | ICD-10-CM | POA: Diagnosis not present

## 2016-10-22 DIAGNOSIS — R945 Abnormal results of liver function studies: Secondary | ICD-10-CM

## 2016-10-22 DIAGNOSIS — Z1212 Encounter for screening for malignant neoplasm of rectum: Secondary | ICD-10-CM | POA: Diagnosis not present

## 2016-10-22 NOTE — Progress Notes (Signed)
Gastroenterology Consultation  Referring Provider:     Steele Sizer, MD Primary Care Physician:  Loistine Chance, MD Primary Gastroenterologist:  Dr. Jonathon Bellows  Reason for Consultation:     RUQ pain and abnormal LFT's         HPI:   Lori [name] is a 50 y.o. y/o female referred for consultation & management  by Dr. Loistine Chance, MD.    She has been referred here for RUQ pain and elevated liver enzymes. She speaks spanish and visit is via an interpretor. She was seen by her doctor , Dr Ancil Boozer on 09/25/16 and mentioned that she has had a cholecystectomy in 2002 when she had RUQ pain.  6 months back had abnormal LFT's at that time she was admitted for facial cellulitis , treated with Zosyn and discharged on clindamycin. Prior to 03/2016 her LFT's have been norma Today she says that when she went to the hospital in 03/2016- she says the pain was similar in nature in 2001 prior to cholecystectomy.The pain lasted for a week . Similar episode 2 years back she had some RUQ pain .USG RUQ 09/27/16 shows CBD 5 mm , normal appearance of liver .   After discharge from hospital the pain returned a month back , constant for a while , presently no pain . Last episode was 2 weeks back. Lasted entire night, worse with food intake. Usually the pain comes in 30 minutes. Localized in nature. Worse with milk intake. Heat helps. No family history of gall stones. Denies use of any OTC meds. Denies use of any herbal medications. Denies any NSAID use.   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 Denies any illegal drug use.   Last colonoscopy in 2002, no family history of colon cancer or polys.   Hepatic Function Latest Ref Rng & Units 09/26/2016 04/07/2016 04/06/2016  Total Protein 6.0 - 8.5 g/dL 7.3 6.5 7.3  Albumin 3.5 - 5.5 g/dL 4.9 3.7 4.3  AST 0 - 40 IU/L 40 112(H) 69(H)  ALT 0 - 32 IU/L 91(H) 150(H) 75(H)  Alk Phosphatase 39 - 117 IU/L 115 178(H) 100  Total Bilirubin 0.0 - 1.2 mg/dL  0.6 0.8 1.0  Bilirubin, Direct 0.00 - 0.40 mg/dL - - -   Past Medical History:  Diagnosis Date  . Asthma   . Cancer, uterine ([place]) 1998  . Chest pain    normal LV function by echo 10/2010  . High cholesterol   . 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 Kidney stone   . Palpitations     Past Surgical History:  Procedure Laterality Date  . APPENDECTOMY    . CESAREAN SECTION     x2  . CHOLECYSTECTOMY    . neurocardiogenic syncope    . PATENT FORAMEN OVALE CLOSURE    . SURGERY ON FACE     AFTER FALL  . WRIST SURGERY     broken wrist,left    Prior to Admission medications   Not on File    Family History  Problem Relation Age of Onset  . Hypertension Mother   . Heart disease Father 61    MI     Social History  Substance Use Topics  . Smoking status: Former Smoker    Packs/day: 1.00    Years: 5.00    Types: Cigarettes    Quit date: 02/11/2006  . Smokeless tobacco: Never Used  . Alcohol use No    Allergies as of 10/22/2016 -  Review Complete 10/22/2016  Allergen Reaction Noted  . Atorvastatin Swelling 10/03/2015  . Ciprofloxacin hcl Other (See Comments) 01/31/2015  . Cyclobenzaprine Other (See Comments) 01/31/2015  . Nortriptyline Other (See Comments) 01/31/2015  . Pravastatin Other (See Comments) 10/03/2015  . Prednisone Other (See Comments) 01/31/2015  . Sulfamethizole Hives 04/26/2014    Review of Systems:    All systems reviewed and negative except where noted in HPI.   Physical Exam:  BP 124/73   Pulse 62   Temp 97.7 F (36.5 C) (Oral)   Ht '5\' 6"'$  (1.676 m)   Wt 149 lb 6.4 oz (67.8 kg)   BMI 24.11 kg/m  No LMP recorded. Patient is not currently having periods (Reason: Perimenopausal). Psych:  Alert and cooperative. Normal mood and affect. General:   Alert,  Well-developed, well-nourished, pleasant and cooperative in NAD Head:  Normocephalic and atraumatic. Eyes:  Sclera clear, no icterus.   Conjunctiva pink. Ears:  Normal auditory acuity. Nose:  No  deformity, discharge, or lesions. Mouth:  No deformity or lesions,oropharynx pink & moist. Neck:  Supple; no masses or thyromegaly. Lungs:  Respirations even and unlabored.  Clear throughout to auscultation.   No wheezes, crackles, or rhonchi. No acute distress. Heart:  Regular rate and rhythm; no murmurs, clicks, rubs, or gallops. Abdomen:  Normal bowel sounds.  No bruits.  Soft, non-tender and non-distended without masses, hepatosplenomegaly or hernias noted.  No guarding or rebound tenderness.    Neurologic:  Alert and oriented x3;  grossly normal neurologically. Psych:  Alert and cooperative. Normal mood and affect.  Imaging Studies: US Abdomen Limited Ruq  Result Date: 09/27/2016 CLINICAL DATA:  Right upper quadrant abdominal pain associated with nausea and elevated liver enzymes. History of previous cholecystectomy, irritable bowel disease, hyperlipidemia, and kidney stones. EXAM: US ABDOMEN LIMITED - RIGHT UPPER QUADRANT COMPARISON:  Abdominal and pelvic CT scan dated July 19, 2014. FINDINGS: Gallbladder: The gallbladder is surgically absent. Common bile duct: Diameter: 4 mm rising to 5 mm at the pancreatic head. Liver: The hepatic echotexture is normal. There is no focal mass or ductal dilation. IMPRESSION: Previous cholecystectomy. Normal appearance of the liver and common bile duct. Electronically Signed   By: David  Martinique M.D.   On: 09/27/2016 11:25    Assessment and Plan:   Lori [name] is a 50 y.o. y/o female has been referred for RUQ pain and abnormal LFT's. I plan to perform a MRCP to rule out stones in her CBD, rule out with labs autoimmune and viral hepatitis. Will repeat LFT's to check if returned to normal . Event in 03/2016 may have been secondary to infection vs antibiotics but also may have been due to a stone in her CBD. If tests are negative may have to consider an EGD. She is also due for colorectal cancer screening and is willing to proceed.  I have discussed  alternative options, risks & benefits,  which include, but are not limited to, bleeding, infection, perforation,respiratory complication & drug reaction.  The patient agrees with this plan & written consent will be obtained.     Follow up in 3-4 weeks.   Dr Jonathon Bellows MD

## 2016-10-22 NOTE — Patient Instructions (Signed)
You are scheduled for a MRCP of the liver at [facility] outpatient imaging, [facility] on Tuesday, November 05, 2016 @ 8:45am. You are to arrive at 8:15am. You cannot have anything to eat or drink after midnight on Monday night.   You have been give an order for labs today. Please go to the [facility] outpatient lab which is [facility] at the [facility] mall.

## 2016-11-05 ENCOUNTER — Ambulatory Visit: Admission: RE | Admit: 2016-11-05 | Payer: Federal, State, Local not specified - PPO | Source: Ambulatory Visit

## 2016-11-20 ENCOUNTER — Telehealth: Payer: Self-pay

## 2016-11-20 NOTE — Telephone Encounter (Signed)
Patient wants a referral to go to to [facility] for her radiology MRI. She wants it for Saturday and that's the only place who does them. I have printed out the MRI order and gave it to her. Please contact her to see if she needs a referral to go there or not. Patient speaks spanish but the husband understands english and can speak english as well. His number is [phone]

## 2016-11-23 ENCOUNTER — Encounter: Payer: Self-pay | Admitting: Family Medicine

## 2016-11-25 NOTE — Telephone Encounter (Signed)
LVM for pt to return my call.

## 2016-11-27 ENCOUNTER — Encounter: Payer: Federal, State, Local not specified - PPO | Admitting: Obstetrics and Gynecology

## 2016-11-27 NOTE — Telephone Encounter (Signed)
No return call from husband. Advised in voicemail, if they need anything else in order to go to [facility] for the MRI to let me know.

## 2016-11-29 ENCOUNTER — Telehealth: Payer: Self-pay | Admitting: Gastroenterology

## 2016-11-29 NOTE — Telephone Encounter (Signed)
Patients husband called and stated that the patient wants to go to [facility] or [place] for the MRI and needs for it to be on a Saturday.  [phone]

## 2016-12-03 NOTE — Telephone Encounter (Signed)
Order faxed to [facility]. They will contact pt to schedule MRI.

## 2016-12-06 ENCOUNTER — Encounter: Payer: Self-pay | Admitting: Family Medicine

## 2017-01-29 ENCOUNTER — Encounter: Payer: Self-pay | Admitting: *Deleted

## 2017-01-29 ENCOUNTER — Telehealth: Payer: Self-pay | Admitting: Internal Medicine

## 2017-01-29 ENCOUNTER — Emergency Department
Admission: EM | Admit: 2017-01-29 | Discharge: 2017-01-29 | Disposition: A | Discharge status: Home / Self Care | Payer: Federal, State, Local not specified - PPO | Attending: Emergency Medicine | Admitting: Emergency Medicine

## 2017-01-29 ENCOUNTER — Emergency Department: Payer: Federal, State, Local not specified - PPO

## 2017-01-29 DIAGNOSIS — Z79899 Other long term (current) drug therapy: Secondary | ICD-10-CM | POA: Insufficient documentation

## 2017-01-29 DIAGNOSIS — Z87891 Personal history of nicotine dependence: Secondary | ICD-10-CM | POA: Insufficient documentation

## 2017-01-29 DIAGNOSIS — R079 Chest pain, unspecified: Secondary | ICD-10-CM

## 2017-01-29 DIAGNOSIS — R202 Paresthesia of skin: Secondary | ICD-10-CM | POA: Diagnosis not present

## 2017-01-29 DIAGNOSIS — J45909 Unspecified asthma, uncomplicated: Secondary | ICD-10-CM | POA: Insufficient documentation

## 2017-01-29 DIAGNOSIS — Z8541 Personal history of malignant neoplasm of cervix uteri: Secondary | ICD-10-CM | POA: Diagnosis not present

## 2017-01-29 LAB — CBC
HCT: 43.5 % (ref 35.0–47.0)
HEMOGLOBIN: 14.8 g/dL (ref 12.0–16.0)
MCH: 29.1 pg (ref 26.0–34.0)
MCHC: 33.9 g/dL (ref 32.0–36.0)
MCV: 85.7 fL (ref 80.0–100.0)
Platelets: 223 10*3/uL (ref 150–440)
RBC: 5.08 MIL/uL (ref 3.80–5.20)
RDW: 13.5 % (ref 11.5–14.5)
WBC: 6.3 10*3/uL (ref 3.6–11.0)

## 2017-01-29 LAB — BASIC METABOLIC PANEL
ANION GAP: 8 (ref 5–15)
BUN: 20 mg/dL (ref 6–20)
CALCIUM: 9.1 mg/dL (ref 8.9–10.3)
CO2: 26 mmol/L (ref 22–32)
CREATININE: 0.7 mg/dL (ref 0.44–1.00)
Chloride: 108 mmol/L (ref 101–111)
GFR calc non Af Amer: >60 mL/min (ref >60)
Glucose, Bld: 111 mg/dL — ABNORMAL HIGH (ref 65–99)
Potassium: 4.3 mmol/L (ref 3.5–5.1)
SODIUM: 142 mmol/L (ref 135–145)

## 2017-01-29 LAB — TROPONIN I

## 2017-01-29 MED ORDER — SODIUM CHLORIDE 0.9 % IV SOLN
Freq: Once | INTRAVENOUS | Status: AC
  Administered 2017-01-29: 13:00:00 via INTRAVENOUS

## 2017-01-29 NOTE — ED Provider Notes (Signed)
[facility] Emergency Department Provider Note       Time seen: ----------------------------------------- 12:40 PM on 01/29/2017 -----------------------------------------     I have reviewed the triage vital signs and the nursing notes.   HISTORY   Chief Complaint Chest Pain    HPI Lori [name] is a 51 y.o. female who presents to the ED for chest pain that began last night. Patient had some tingling in her hands and pain on the left side from her breast into her back. She's had some nausea otherwise denies complaints. Patient states she's had similar symptoms in the past but her main complaint is dizziness. She does have a history of neurocardiogenic syncope for which she occasionally she takes salt tablet or salty food. Patient advised that is usually worse during the summertime. She does report increased recent stress due to a miscarriage in the family.   Past Medical History:  Diagnosis Date  . Asthma   . Cancer, uterine ([place]) 1998  . Chest pain    normal LV function by echo 10/2010  . High cholesterol   . 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 Kidney stone   . Palpitations     Patient Active Problem List   Diagnosis Date Noted  . Facial cellulitis 04/06/2016  . Dental infection 04/06/2016  . Neck pain 04/06/2016  . BPPV (benign paroxysmal positional vertigo) 02/22/2016  . Perennial allergic rhinitis 02/22/2016  . Metabolic syndrome 00/93/8182  . Eustachian tube dysfunction 09/15/2015  . Asthma, mild intermittent, poorly controlled 09/15/2015  . Cervical spondylosis 08/29/2015  . Overweight (BMI 25.0-29.9) 06/29/2015  . IBS (irritable bowel syndrome) 06/29/2015  . 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 Cervical radiculitis 07/19/2014  . 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 Dysautonomia 10/01/2012  . History of syncope 07/07/2012  . Depression with anxiety 06/11/2011  . Dyslipidemia 11/02/2010     Past Surgical History:  Procedure Laterality Date  . APPENDECTOMY    . CESAREAN SECTION     x2  . CHOLECYSTECTOMY    . neurocardiogenic syncope    . PATENT FORAMEN OVALE CLOSURE    . SURGERY ON FACE     AFTER FALL  . WRIST SURGERY     broken wrist,left    Allergies Atorvastatin; Ciprofloxacin hcl; Cyclobenzaprine; Nortriptyline; Pravastatin; Prednisone; and Sulfamethizole  Social History Social History  Substance Use Topics  . Smoking status: Former Smoker    Packs/day: 1.00    Years: 5.00    Types: Cigarettes    Quit date: 02/11/2006  . Smokeless tobacco: Never Used  . Alcohol use No    Review of Systems Constitutional: Negative for fever. Cardiovascular: Positive for recent chest pain Respiratory: Negative for shortness of breath. Gastrointestinal: Negative for abdominal pain, vomiting and diarrhea. Genitourinary: Negative for dysuria. Musculoskeletal: Negative for back pain. Skin: Negative for rash. Neurological: Negative for headaches, focal weakness or numbness. Positive for recent paresthesias  10-point ROS otherwise negative.  ____________________________________________   PHYSICAL EXAM:  VITAL SIGNS: ED Triage Vitals  Enc Vitals Group     BP 01/29/17 1043 (!) 107/57     Pulse Rate 01/29/17 1043 71     Resp 01/29/17 1043 18     Temp 01/29/17 1043 97.8 F (36.6 C)     Temp Source 01/29/17 1043 Oral     SpO2 01/29/17 1043 98 %     Weight 01/29/17 1043 146 lb (66.2 kg)     Height 01/29/17 1043 5\' 6"  (1.676 m)  Head Circumference --      Peak Flow --      Pain Score 01/29/17 1050 8     Pain Loc --      Pain Edu? --      Excl. in [place]? --     Constitutional: Alert and oriented. Well appearing and in no distress. Eyes: Conjunctivae are normal. PERRL. Normal extraocular movements. ENT   Head: Normocephalic and atraumatic.   Nose: No congestion/rhinnorhea.   Mouth/Throat: Mucous membranes are moist.   Neck: No  stridor. Cardiovascular: Normal rate, regular rhythm. No murmurs, rubs, or gallops. Respiratory: Normal respiratory effort without tachypnea nor retractions. Breath sounds are clear and equal bilaterally. No wheezes/rales/rhonchi. Gastrointestinal: Soft and nontender. Normal bowel sounds Musculoskeletal: Nontender with normal range of motion in extremities. No lower extremity tenderness nor edema. Neurologic:  Normal speech and language. No gross focal neurologic deficits are appreciated.  Skin:  Skin is warm, dry and intact. No rash noted. Psychiatric: Mood and affect are normal. Speech and behavior are normal.  ____________________________________________  EKG: Interpreted by me.Sinus rhythm with PACs, rate is 71 bpm, normal PR interval, normal QRS, normal QT.  ____________________________________________  ED COURSE:  Pertinent labs & imaging results that were available during my care of the patient were reviewed by me and considered in my medical decision making (see chart for details). Patient presents for chest pain and dizziness, we will assess with labs and imaging as indicated.   Procedures ____________________________________________   LABS (pertinent positives/negatives)  Labs Reviewed  BASIC METABOLIC PANEL - Abnormal; Notable for the following:       Result Value   Glucose, Bld 111 (*)    All other components within normal limits  CBC  TROPONIN I    RADIOLOGY Chest x-ray IMPRESSION: Negative chest.   ____________________________________________  FINAL ASSESSMENT AND PLAN  Chest pain, paresthesias  Plan: Patient's labs and imaging were dictated above. Patient had presented for chest pain which seems noncardiac. She is also had some dizziness which she has a history of. This appeared to be related to neurocardiogenic syncope which she has had a history of. She was given fluids and is currently asymptomatic. She also had some paresthesias which seemed to be  stress related. Overall she is stable for outpatient follow-up.   Earleen Newport, MD   Note: This note was generated in part or whole with voice recognition software. Voice recognition is usually quite accurate but there are transcription errors that can and very often do occur. I apologize for any typographical errors that were not detected and corrected.     Earleen Newport, MD 01/29/17 [phone]

## 2017-01-29 NOTE — ED Notes (Signed)
Patient denies pain and is resting comfortably.  

## 2017-01-29 NOTE — Telephone Encounter (Signed)
Used language line for patient Lori [name] ID# [phone]   Patient came to office with c/o low bp (90/63) Chest pain and pressure dizziness nausea and a weird numbness and tingles in hands and feet    Patient spoke with sharon rn and was excorted via wheelchair to [facility] ed.

## 2017-01-29 NOTE — Telephone Encounter (Signed)
s/w pt via interpreter Costella Hatcher [phone].  Pt reports hypotension, chest pain and pressure, dizziness, nausea, numbness and tingling in hands and feet since last evening. She ate some soup in hopes to bring up BP. BP reported to be 90/63. Advised pt to proceed to ED for evaluation. Pt and daughter agreeable. Escorted pt via wheelchair to [facility] ED.

## 2017-01-29 NOTE — ED Triage Notes (Signed)
Brought over from [facility], Per interpreter pt states chest pain that began last night, states tingling in her hands, pain left sided from her breast to her back, states nausea, awake and alert in no acute distress

## 2017-03-29 ENCOUNTER — Encounter: Payer: Self-pay | Admitting: Family Medicine

## 2017-07-02 ENCOUNTER — Encounter: Payer: Federal, State, Local not specified - PPO | Admitting: Obstetrics and Gynecology

## 2017-10-09 ENCOUNTER — Telehealth: Payer: Self-pay | Admitting: Family Medicine

## 2017-10-09 NOTE — Telephone Encounter (Signed)
I have not seen her since 08/2016, she needs to see someone over there

## 2017-10-09 NOTE — Telephone Encounter (Signed)
Copied from [place]. Topic: Quick Communication - See Telephone Encounter >> Oct 09, 2017  9:08 AM Cleaster Corin, NT wrote: CRM for notification. See Telephone encounter for:   10/09/17. Husband called wanting to speak with Dr. Ancil Boozer or nurse concerning pt. Health. Pt. Is in [place]. Pt. Husband can be reached at [phone] Dennard Schaumann)

## 2017-10-09 NOTE — Telephone Encounter (Signed)
Patient husband wanted to know what the name of a lab she was tested for in July. Looked it up and she was tested for C.diff. That is all the information patient wanted.

## 2017-10-25 ENCOUNTER — Emergency Department
Admission: EM | Admit: 2017-10-25 | Discharge: 2017-10-25 | Disposition: A | Discharge status: Home / Self Care | Payer: Federal, State, Local not specified - PPO | Attending: Emergency Medicine | Admitting: Emergency Medicine

## 2017-10-25 ENCOUNTER — Other Ambulatory Visit: Payer: Self-pay

## 2017-10-25 ENCOUNTER — Emergency Department: Payer: Federal, State, Local not specified - PPO

## 2017-10-25 ENCOUNTER — Encounter: Payer: Self-pay | Admitting: Emergency Medicine

## 2017-10-25 DIAGNOSIS — Z8541 Personal history of malignant neoplasm of cervix uteri: Secondary | ICD-10-CM | POA: Diagnosis not present

## 2017-10-25 DIAGNOSIS — M7989 Other specified soft tissue disorders: Secondary | ICD-10-CM | POA: Diagnosis not present

## 2017-10-25 DIAGNOSIS — Z87891 Personal history of nicotine dependence: Secondary | ICD-10-CM | POA: Insufficient documentation

## 2017-10-25 DIAGNOSIS — J4520 Mild intermittent asthma, uncomplicated: Secondary | ICD-10-CM | POA: Diagnosis not present

## 2017-10-25 DIAGNOSIS — Z79899 Other long term (current) drug therapy: Secondary | ICD-10-CM | POA: Diagnosis not present

## 2017-10-25 NOTE — ED Provider Notes (Signed)
[facility] Emergency Department Provider Note  ____________________________________________  Time seen: Approximately 3:01 PM  I have reviewed the triage vital signs and the nursing notes.   HISTORY  Chief Complaint Leg Pain    HPI [name] is a 51 y.o. female that presents to the emergency department for evaluation of left leg swelling after long car ride for 1 day.  Patient drove from [place] to here yesterday.  Swelling started after the drive.  She is having pain around her ankle. Medical history includes episodes of hypotensive syncope, for which she sees a cardiologist every 3 months.  She had surgery done on her ankle 10 years ago.  No trauma to ankle.  She took a dose of aspirin this morning. No cough, SOB, CP, erythema, numbness, tingling.   Past Medical History:  Diagnosis Date  . Asthma   . Cancer, uterine ([place]) 1998  . Chest pain    normal LV function by echo 10/2010  . High cholesterol   . 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 Kidney stone   . Palpitations     Patient Active Problem List   Diagnosis Date Noted  . Facial cellulitis 04/06/2016  . Dental infection 04/06/2016  . Neck pain 04/06/2016  . BPPV (benign paroxysmal positional vertigo) 02/22/2016  . Perennial allergic rhinitis 02/22/2016  . Metabolic syndrome 04/26/1600  . Eustachian tube dysfunction 09/15/2015  . Asthma, mild intermittent, poorly controlled 09/15/2015  . Cervical spondylosis 08/29/2015  . Overweight (BMI 25.0-29.9) 06/29/2015  . IBS (irritable bowel syndrome) 06/29/2015  . 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 Cervical radiculitis 07/19/2014  . 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 Dysautonomia ([place]) 10/01/2012  . History of syncope 07/07/2012  . Depression with anxiety 06/11/2011  . Dyslipidemia 11/02/2010    Past Surgical History:  Procedure Laterality Date  . APPENDECTOMY    . CESAREAN SECTION     x2  .  CHOLECYSTECTOMY    . neurocardiogenic syncope    . PATENT FORAMEN OVALE CLOSURE    . SURGERY ON FACE     AFTER FALL  . WRIST SURGERY     broken wrist,left    Prior to Admission medications   Medication Sig Start Date End Date Taking? Authorizing Provider  BIOTIN PO Take 1 tablet by mouth daily.    [provider]  Multiple Vitamin (MULTIVITAMIN) tablet Take 1 tablet by mouth daily.    [provider]    Allergies Atorvastatin; Ciprofloxacin hcl; Cyclobenzaprine; Nortriptyline; Pravastatin; Prednisone; and Sulfamethizole  Family History  Problem Relation Age of Onset  . Hypertension Mother   . Heart disease Father 66       MI    Social History Social History   Tobacco Use  . Smoking status: Former Smoker    Packs/day: 1.00    Years: 5.00    Pack years: 5.00    Types: Cigarettes    Last attempt to quit: 02/11/2006    Years since quitting: 11.7  . Smokeless tobacco: Never Used  Substance Use Topics  . Alcohol use: No    Alcohol/week: 0.0 oz  . Drug use: No     Review of Systems  Cardiovascular: No chest pain. Respiratory: No SOB. Gastrointestinal: No abdominal pain.  No nausea, no vomiting.  Skin: Negative for rash, abrasions, lacerations, ecchymosis. Neurological: Negative for headaches, numbness or tingling   ____________________________________________   PHYSICAL EXAM:  VITAL SIGNS: ED Triage Vitals  Enc Vitals Group  BP 10/25/17 1212 121/72     Pulse Rate 10/25/17 1212 64     Resp 10/25/17 1212 20     Temp 10/25/17 1212 98.2 F (36.8 C)     Temp Source 10/25/17 1212 Oral     SpO2 10/25/17 1212 98 %     Weight 10/25/17 1213 155 lb (70.3 kg)     Height 10/25/17 1213 5\' 6"  (1.676 m)     Head Circumference --      Peak Flow --      Pain Score 10/25/17 1212 10     Pain Loc --      Pain Edu? --      Excl. in [place]? --      Constitutional: Alert and oriented. Well appearing and in no acute distress. Eyes: Conjunctivae are  normal. PERRL. EOMI. Head: Atraumatic. ENT:      Ears:      Nose: No congestion/rhinnorhea.      Mouth/Throat: Mucous membranes are moist.  Neck: No stridor.  Cardiovascular: Normal rate, regular rhythm.  Good peripheral circulation. Respiratory: Normal respiratory effort without tachypnea or retractions. Lungs CTAB. Good air entry to the bases with no decreased or absent breath sounds. Gastrointestinal: Bowel sounds 4 quadrants. Soft and nontender to palpation. No guarding or rigidity. No palpable masses. No distention.  Musculoskeletal: Full range of motion to all extremities. No gross deformities appreciated. Full range of motion of ankle.  1+ nonpitting edema to left ankle.  Tenderness to palpation over distal left shin.  No tenderness to palpation over calf.  No erythema or warmth.   Neurologic:  Normal speech and language. No gross focal neurologic deficits are appreciated.  Skin:  Skin is warm, dry and intact. No rash noted.   ____________________________________________   LABS (all labs ordered are listed, but only abnormal results are displayed)  Labs Reviewed - No data to display ____________________________________________  EKG   ____________________________________________  RADIOLOGY Robinette Haines, personally viewed and evaluated these images (plain radiographs) as part of my medical decision making, as well as reviewing the written report by the radiologist.  US Venous Img Lower Unilateral Left  Result Date: 10/25/2017 CLINICAL DATA:  51 year old female with a history of calf pain EXAM: LEFT LOWER EXTREMITY VENOUS DOPPLER ULTRASOUND TECHNIQUE: Gray-scale sonography with graded compression, as well as color Doppler and duplex ultrasound were performed to evaluate the lower extremity deep venous systems from the level of the common femoral vein and including the common femoral, femoral, profunda femoral, popliteal and calf veins including the posterior tibial,  peroneal and gastrocnemius veins when visible. The superficial great saphenous vein was also interrogated. Spectral Doppler was utilized to evaluate flow at rest and with distal augmentation maneuvers in the common femoral, femoral and popliteal veins. COMPARISON:  None. FINDINGS: Contralateral Common Femoral Vein: Respiratory phasicity is normal and symmetric with the symptomatic side. No evidence of thrombus. Normal compressibility. Common Femoral Vein: No evidence of thrombus. Normal compressibility, respiratory phasicity and response to augmentation. Saphenofemoral Junction: No evidence of thrombus. Normal compressibility and flow on color Doppler imaging. Profunda Femoral Vein: No evidence of thrombus. Normal compressibility and flow on color Doppler imaging. Femoral Vein: No evidence of thrombus. Normal compressibility, respiratory phasicity and response to augmentation. Popliteal Vein: No evidence of thrombus. Normal compressibility, respiratory phasicity and response to augmentation. Calf Veins: No evidence of thrombus. Normal compressibility and flow on color Doppler imaging. Superficial Great Saphenous Vein: No evidence of thrombus. Normal compressibility and flow on color Doppler  imaging. Other Findings:  None. IMPRESSION: Sonographic survey of the left lower extremity negative for DVT Electronically Signed   By: Corrie Mckusick D.O.   On: 10/25/2017 13:06    ____________________________________________    PROCEDURES  Procedure(s) performed:    Procedures    Medications - No data to display   ____________________________________________   INITIAL IMPRESSION / ASSESSMENT AND PLAN / ED COURSE  Pertinent labs & imaging results that were available during my care of the patient were reviewed by me and considered in my medical decision making (see chart for details).  Review of the [place] CSRS was performed in accordance of the [place] prior to dispensing any controlled drugs.   Patient  presented to the emergency department for evaluation of left leg swelling for 1 day after a 2 day drive from [place].  Vital signs and exam are reassuring.  Ultrasound negative for DVT. Swelling is likely due to drive. Patient does not want any blood work completed while in ED.  Leg was ace bandaged.  She will elevate leg at home.  Patient does not want any medications and states that he will not take them. Patient is to follow up with PCP as directed. Patient is given ED precautions to return to the ED for any worsening or new symptoms.     ____________________________________________  FINAL CLINICAL IMPRESSION(S) / ED DIAGNOSES  Final diagnoses:  Leg swelling      NEW MEDICATIONS STARTED DURING THIS VISIT:  ED Discharge Orders    None          This chart was dictated using voice recognition software/Dragon. Despite best efforts to proofread, errors can occur which can change the meaning. Any change was purely unintentional.    Laban Emperor, PA-C 10/25/17 1630    Burlene Arnt Gerda Diss, MD 10/25/17 [phone]

## 2017-10-25 NOTE — ED Triage Notes (Signed)
L lower leg and foot pain noted yesterday. Denies injury.

## 2017-10-26 DIAGNOSIS — M7662 Achilles tendinitis, left leg: Secondary | ICD-10-CM | POA: Insufficient documentation

## 2018-01-09 IMAGING — US US PELVIS COMPLETE
1 series · 14 of 25 positions shown · non-contrast
Comparison: 08/07/2012

CLINICAL DATA: Pelvic pain in female for 2 weeks. Perimenopausal
with LMP 12/12/2015.

EXAM:
TRANSABDOMINAL AND TRANSVAGINAL ULTRASOUND OF PELVIS
TECHNIQUE: Both transabdominal and transvaginal ultrasound examinations of the
pelvis were performed. Transabdominal technique was performed for
global imaging of the pelvis including uterus, ovaries, adnexal
regions, and pelvic cul-de-sac. It was necessary to proceed with
endovaginal exam following the transabdominal exam to visualize the
retroverted uterus and ovaries.

[Series 1: us pelvis complete · 0.27mm/px · 14 of 111 slices shown]
[im 1/111]
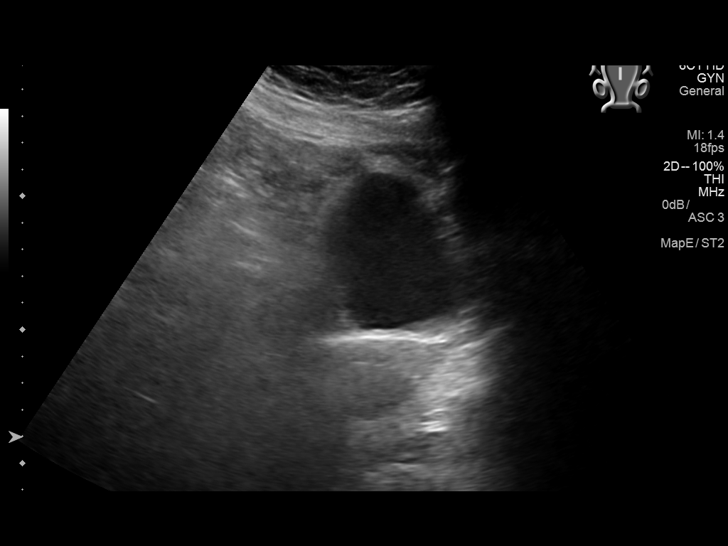
[im 10/111]
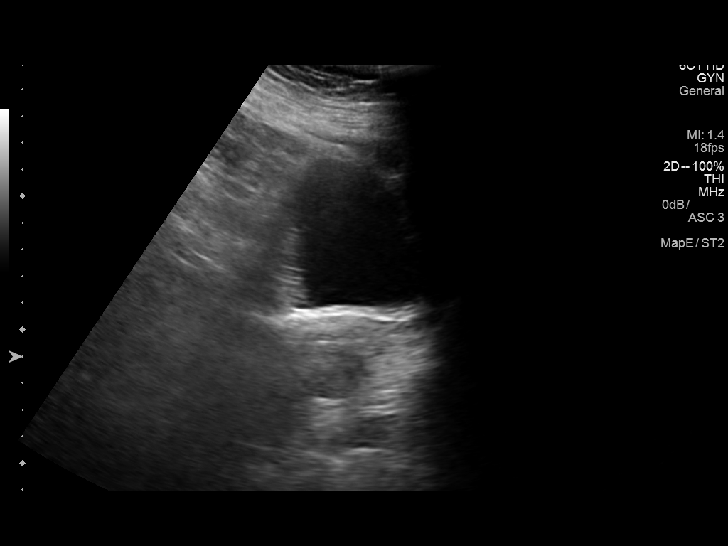
[im 19/111]
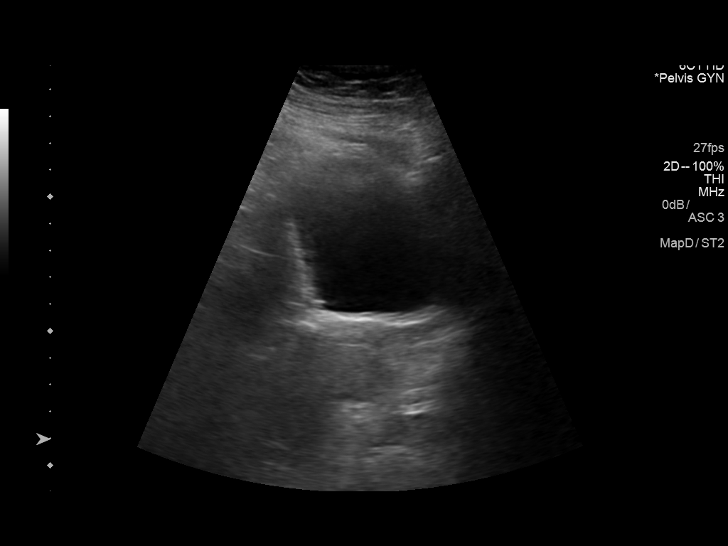
[im 28/111]
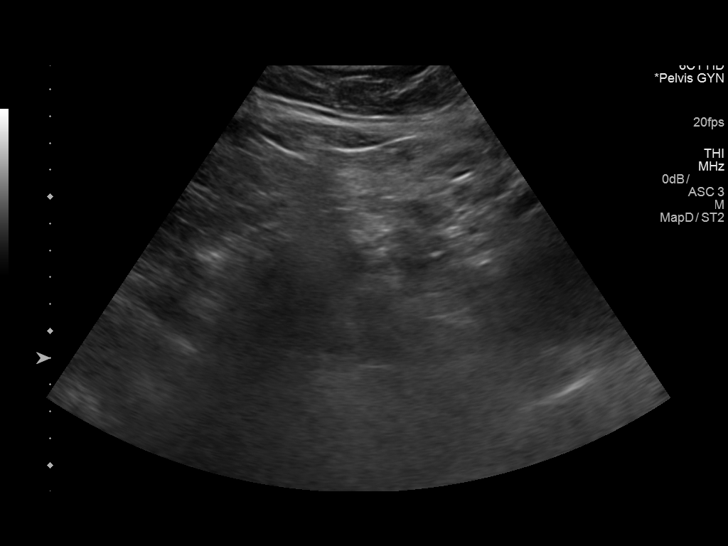
[im 37/111]
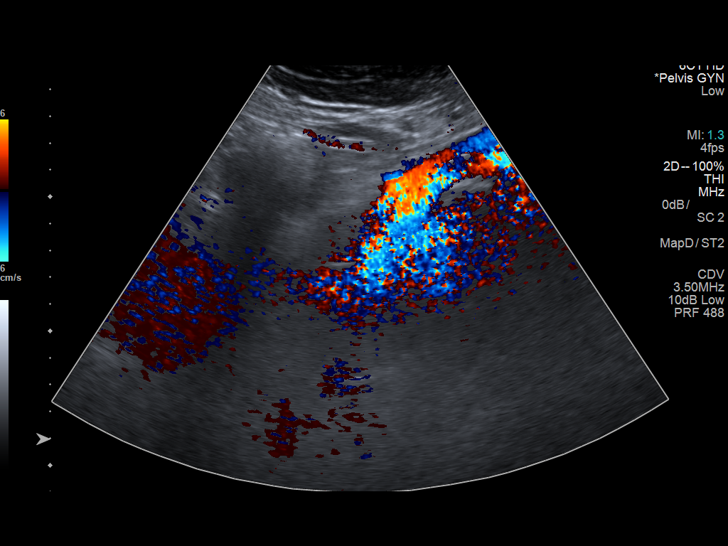
[im 42/111]
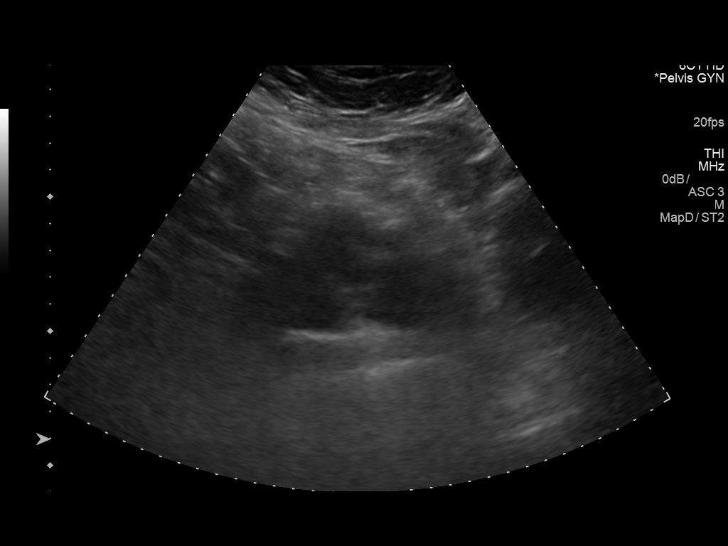
[im 51/111]
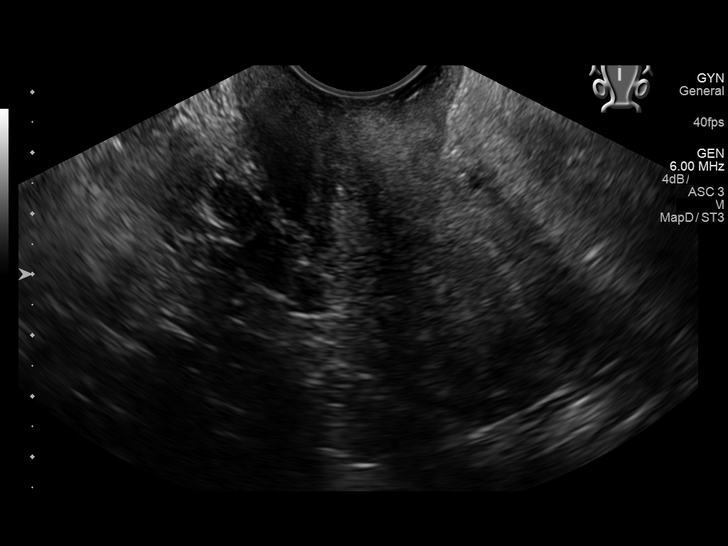
[im 60/111]
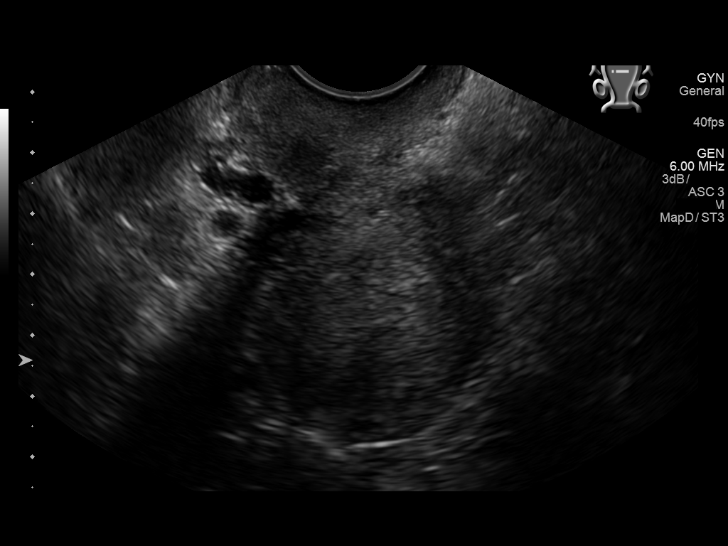
[im 69/111]
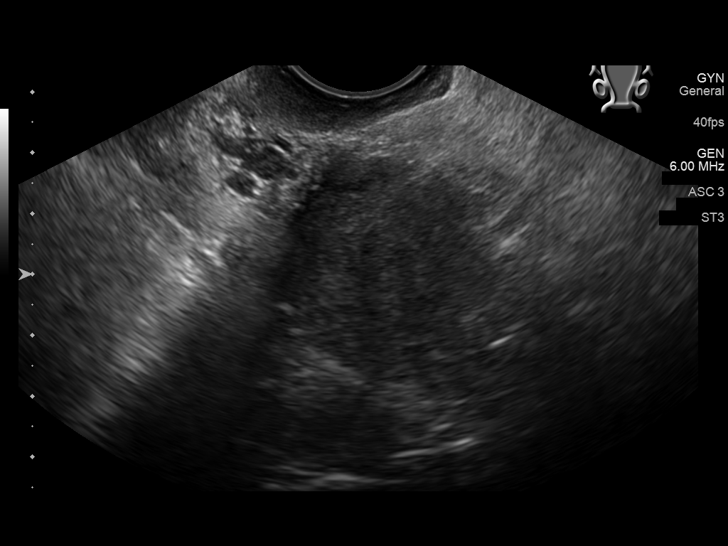
[im 74/111]
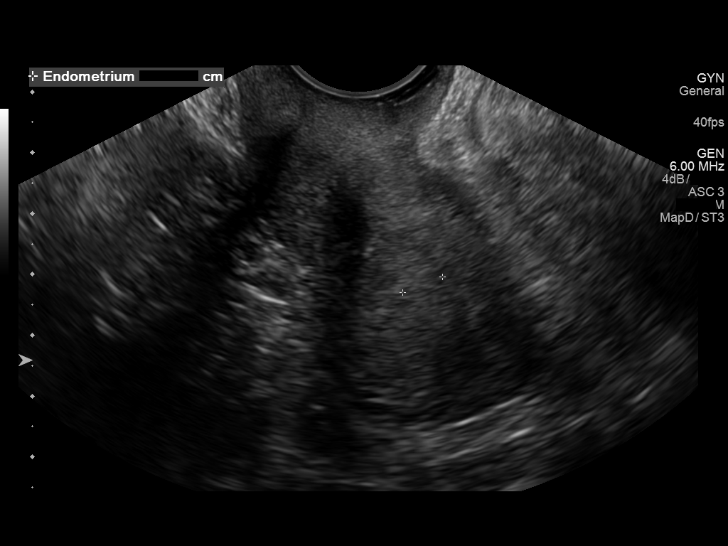
[im 83/111]
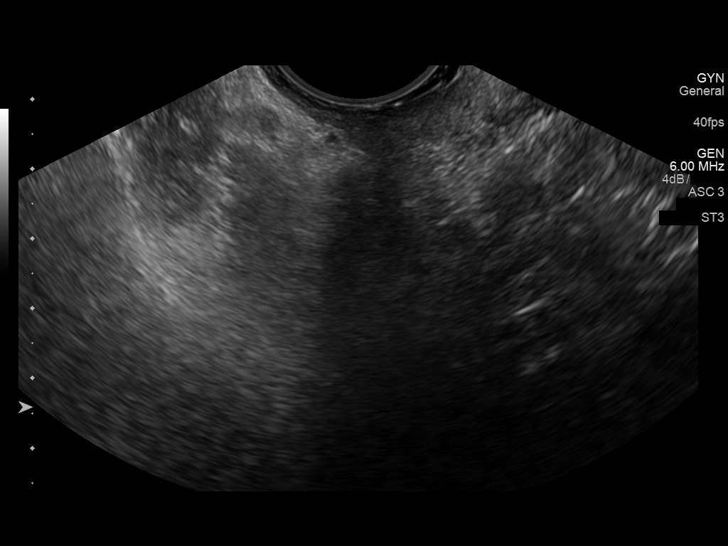
[im 92/111]
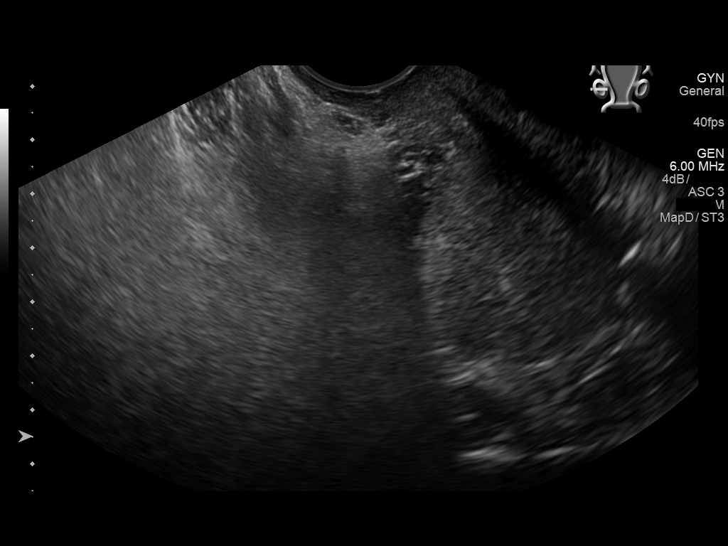
[im 101/111]
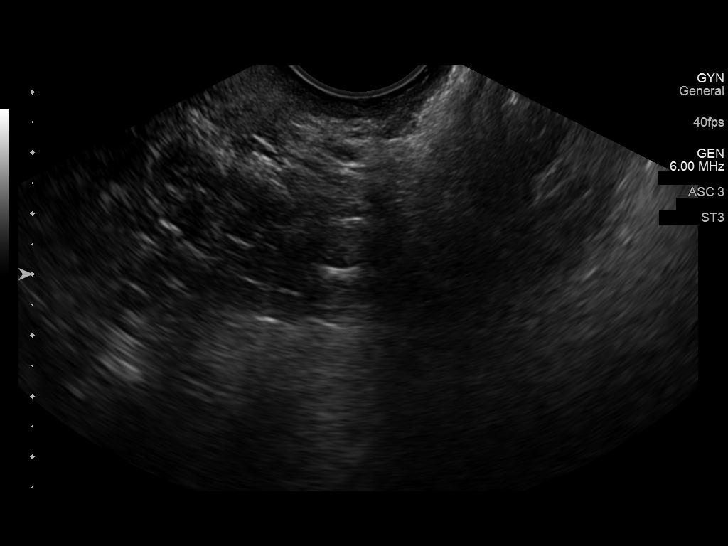
[im 111/111]
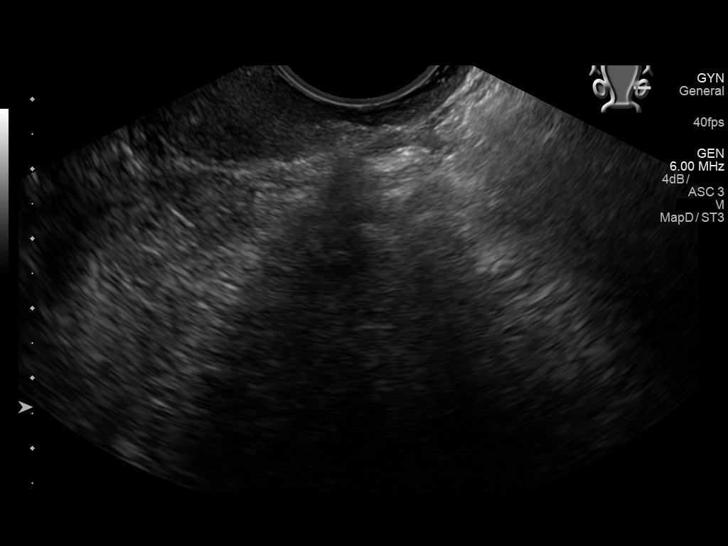

[14 of 25 positions shown; findings below may reference images not displayed]

FINDINGS: Uterus

Measurements: 7.2 x 3.6 x 3.8 cm. Diffusely heterogeneous
echotexture, but no distinct fibroids visualized.

Endometrium

Thickness: 10 mm.  No focal abnormality visualized.

Right ovary

Measurements: Not directly visualized, however no adnexal mass
identified.

Left ovary

Measurements: Not directly visualized, however no adnexal mass
identified.

Other findings

No abnormal free fluid.
IMPRESSION: Retroverted uterus.  No fibroids or other mass identified.

Nonvisualization of ovaries, however no adnexal masses identified.

## 2018-02-05 ENCOUNTER — Other Ambulatory Visit: Payer: Self-pay

## 2018-02-05 ENCOUNTER — Emergency Department
Admission: EM | Admit: 2018-02-05 | Discharge: 2018-02-05 | Disposition: A | Discharge status: Home / Self Care | Payer: Federal, State, Local not specified - PPO | Attending: Emergency Medicine | Admitting: Emergency Medicine

## 2018-02-05 ENCOUNTER — Emergency Department: Payer: Federal, State, Local not specified - PPO

## 2018-02-05 DIAGNOSIS — Z87891 Personal history of nicotine dependence: Secondary | ICD-10-CM | POA: Insufficient documentation

## 2018-02-05 DIAGNOSIS — R079 Chest pain, unspecified: Secondary | ICD-10-CM | POA: Diagnosis present

## 2018-02-05 DIAGNOSIS — Z8541 Personal history of malignant neoplasm of cervix uteri: Secondary | ICD-10-CM | POA: Diagnosis not present

## 2018-02-05 DIAGNOSIS — R0602 Shortness of breath: Secondary | ICD-10-CM | POA: Insufficient documentation

## 2018-02-05 DIAGNOSIS — J4520 Mild intermittent asthma, uncomplicated: Secondary | ICD-10-CM | POA: Diagnosis not present

## 2018-02-05 LAB — BASIC METABOLIC PANEL
ANION GAP: 7 (ref 5–15)
BUN: 26 mg/dL — ABNORMAL HIGH (ref 6–20)
CO2: 29 mmol/L (ref 22–32)
Calcium: 9.3 mg/dL (ref 8.9–10.3)
Chloride: 104 mmol/L (ref 101–111)
Creatinine, Ser: 0.75 mg/dL (ref 0.44–1.00)
GFR calc Af Amer: >60 mL/min (ref >60)
GLUCOSE: 114 mg/dL — AB (ref 65–99)
POTASSIUM: 4.4 mmol/L (ref 3.5–5.1)
Sodium: 140 mmol/L (ref 135–145)

## 2018-02-05 LAB — CBC
HEMATOCRIT: 41.7 % (ref 35.0–47.0)
HEMOGLOBIN: 14.3 g/dL (ref 12.0–16.0)
MCH: 29.4 pg (ref 26.0–34.0)
MCHC: 34.4 g/dL (ref 32.0–36.0)
MCV: 85.5 fL (ref 80.0–100.0)
Platelets: 236 10*3/uL (ref 150–440)
RBC: 4.88 MIL/uL (ref 3.80–5.20)
RDW: 13.7 % (ref 11.5–14.5)
WBC: 9 10*3/uL (ref 3.6–11.0)

## 2018-02-05 LAB — TROPONIN I: Troponin I: <0.03 ng/mL (ref <0.03)

## 2018-02-05 NOTE — ED Triage Notes (Addendum)
Pt to triage via wheelchair.  Pt reports chest pain.  Sx for 2 days.  Pt also reports intermittent sob.  No cough . Pt alert.

## 2018-02-05 NOTE — ED Triage Notes (Signed)
FIRST NURSE NOTE-here for chest pain.  Pulled EKG

## 2018-02-05 NOTE — ED Provider Notes (Signed)
[facility] Emergency Department Provider Note       Time seen: ----------------------------------------- 5:17 PM on 02/05/2018 -----------------------------------------   I have reviewed the triage vital signs and the nursing notes.  HISTORY   Chief Complaint Chest Pain    HPI Lori [name] is a 52 y.o. female with a history of asthma, uterine cancer, chest pain, hyperlipidemia, palpitations who presents to the ED for intermittent chest pain and shortness of breath for the past 2 days.  Patient describes chest soreness.  Patient states she had an episode where she had fast heartbeat and afterwards her chest is sore.  She does have a history of this and has seen cardiology for it in the past.  Currently her palpitations have resolved, she denies other complaints other than chest soreness.  Past Medical History:  Diagnosis Date  . Asthma   . Cancer, uterine ([place]) 1998  . Chest pain    normal LV function by echo 10/2010  . High cholesterol   . 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 Kidney stone   . Palpitations     Patient Active Problem List   Diagnosis Date Noted  . Facial cellulitis 04/06/2016  . Dental infection 04/06/2016  . Neck pain 04/06/2016  . BPPV (benign paroxysmal positional vertigo) 02/22/2016  . Perennial allergic rhinitis 02/22/2016  . Metabolic syndrome 40/05/6760  . Eustachian tube dysfunction 09/15/2015  . Asthma, mild intermittent, poorly controlled 09/15/2015  . Cervical spondylosis 08/29/2015  . Overweight (BMI 25.0-29.9) 06/29/2015  . IBS (irritable bowel syndrome) 06/29/2015  . 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 Cervical radiculitis 07/19/2014  . 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 Dysautonomia ([place]) 10/01/2012  . History of syncope 07/07/2012  . Depression with anxiety 06/11/2011  . Dyslipidemia 11/02/2010    Past Surgical History:  Procedure Laterality Date  . APPENDECTOMY     . CESAREAN SECTION     x2  . CHOLECYSTECTOMY    . neurocardiogenic syncope    . PATENT FORAMEN OVALE CLOSURE    . SURGERY ON FACE     AFTER FALL  . WRIST SURGERY     broken wrist,left    Allergies Atorvastatin; Ciprofloxacin hcl; Cyclobenzaprine; Nortriptyline; Pravastatin; Prednisone; and Sulfamethizole  Social History Social History   Tobacco Use  . Smoking status: Former Smoker    Packs/day: 1.00    Years: 5.00    Pack years: 5.00    Types: Cigarettes    Last attempt to quit: 02/11/2006    Years since quitting: 11.9  . Smokeless tobacco: Never Used  Substance Use Topics  . Alcohol use: No    Alcohol/week: 0.0 oz  . Drug use: No   Review of Systems Constitutional: Negative for fever. Eyes: Negative for vision changes ENT:  Negative for congestion, sore throat Cardiovascular: Positive for chest pain, palpitations Respiratory: Negative for shortness of breath. Gastrointestinal: Negative for abdominal pain, vomiting and diarrhea. Musculoskeletal: Negative for back pain. Skin: Negative for rash. Neurological: Negative for headaches, focal weakness or numbness.  All systems negative/normal/unremarkable except as stated in the HPI  ____________________________________________   PHYSICAL EXAM:  VITAL SIGNS: ED Triage Vitals  Enc Vitals Group     BP 02/05/18 1551 112/62     Pulse Rate 02/05/18 1551 68     Resp 02/05/18 1551 20     Temp 02/05/18 1552 97.6 F (36.4 C)     Temp Source 02/05/18 1551 Oral     SpO2 02/05/18 1551 100 %  Weight 02/05/18 1553 165 lb (74.8 kg)     Height 02/05/18 1553 5\' 5"  (1.651 m)     Head Circumference --      Peak Flow --      Pain Score 02/05/18 1552 7     Pain Loc --      Pain Edu? --      Excl. in [place]? --    Constitutional: Alert and oriented. Well appearing and in no distress. Eyes: Conjunctivae are normal. Normal extraocular movements. ENT   Head: Normocephalic and atraumatic.   Nose: No  congestion/rhinnorhea.   Mouth/Throat: Mucous membranes are moist.   Neck: No stridor. Cardiovascular: Normal rate, regular rhythm. No murmurs, rubs, or gallops. Respiratory: Normal respiratory effort without tachypnea nor retractions. Breath sounds are clear and equal bilaterally. No wheezes/rales/rhonchi. Gastrointestinal: Soft and nontender. Normal bowel sounds Musculoskeletal: Nontender with normal range of motion in extremities. No lower extremity tenderness nor edema.  Nonfocal chest wall tenderness is noted Neurologic:  Normal speech and language. No gross focal neurologic deficits are appreciated.  Skin:  Skin is warm, dry and intact. No rash noted. Psychiatric: Mood and affect are normal. Speech and behavior are normal.  ____________________________________________  EKG: Interpreted by me.  Sinus rhythm rate of 66 bpm, normal PR interval, normal QRS, normal QT.  ____________________________________________  ED COURSE:  As part of my medical decision making, I reviewed the following data within the [place] History obtained from family if available, nursing notes, old chart and ekg, as well as notes from prior ED visits. Patient presented for nonspecific chest pain, we will assess with labs and imaging as indicated at this time.   Procedures ____________________________________________   LABS (pertinent positives/negatives)  Labs Reviewed  BASIC METABOLIC PANEL - Abnormal; Notable for the following components:      Result Value   Glucose, Bld 114 (*)    BUN 26 (*)    All other components within normal limits  CBC  TROPONIN I    RADIOLOGY Images were viewed by me  Chest x-ray is normal  ____________________________________________  DIFFERENTIAL DIAGNOSIS   Chest wall pain, GERD, anxiety, palpitations  FINAL ASSESSMENT AND PLAN  Chest pain   Plan: The patient had presented for nonspecific chest pain. Patient's labs were reassuring.  Patient's imaging is also reassuring.  I did offer her multiple different medications to try to alleviate her symptoms but she has declined because she states she does not like to take medicine.  She is cleared for outpatient follow-up.   Laurence Aly, MD   Note: This note was generated in part or whole with voice recognition software. Voice recognition is usually quite accurate but there are transcription errors that can and very often do occur. I apologize for any typographical errors that were not detected and corrected.     Earleen Newport, MD 02/05/18 1726

## 2018-02-10 ENCOUNTER — Encounter: Payer: Self-pay | Admitting: Family Medicine

## 2018-02-12 ENCOUNTER — Encounter: Payer: Self-pay | Admitting: Family Medicine

## 2018-02-12 ENCOUNTER — Ambulatory Visit: Payer: Federal, State, Local not specified - PPO | Admitting: Family Medicine

## 2018-02-12 VITALS — BP 120/76 | HR 95 | Temp 97.7°F | Resp 14 | Ht 65.0 in | Wt 157.5 lb

## 2018-02-12 DIAGNOSIS — G901 Familial dysautonomia [Riley-Day]: Secondary | ICD-10-CM | POA: Diagnosis not present

## 2018-02-12 DIAGNOSIS — N200 Calculus of kidney: Secondary | ICD-10-CM | POA: Diagnosis not present

## 2018-02-12 DIAGNOSIS — R748 Abnormal levels of other serum enzymes: Secondary | ICD-10-CM | POA: Diagnosis not present

## 2018-02-12 DIAGNOSIS — Z23 Encounter for immunization: Secondary | ICD-10-CM | POA: Diagnosis not present

## 2018-02-12 MED ORDER — AMOXICILLIN-POT CLAVULANATE 875-125 MG PO TABS: 1.0000 | ORAL_TABLET | Freq: Two times a day (BID) | ORAL | 0 refills | Status: DC

## 2018-02-12 NOTE — Progress Notes (Signed)
Name: Lori [name]   MRN: [id number]    DOB: [birth year]   Date:02/12/2018       Progress Note  Subjective  Chief Complaint  Chief Complaint  Patient presents with  . Nephrolithiasis    Patient was evaluated at [facility] and diagnosed with UTI and prescribed Levofloxacin. She passed a stone yesterday about the half the size of a grain of rice.     HPI  Kidney stone: she has a history of kidney stones and in the past had a UPJ obstruction in the past, she has never seen an urologist. Developed left flank pain about one week ago, but much worse two days ago, with hesitancy, lower abdominal pain radiating to her back, no fever or chills. She went to [facility] and was given Levaquin, she passed the stones yesterday and she started to feel better. She states Levaquin is causing tremors , nausea and joint pains, she asked to change antibiotics.   Syncope: seen by [name], but states symptoms usually only when hot outside. She has noticed increased in tachycardia in the evenings. She would like to go back to see him, last syncopal episode was two days ago.   Abnormal liver enzymes: she was seen by Dr. Vicente Males, but lost to follow up ( states the office moved and she did not get a call back), she would like to see someone else. Labs were not done at the time or MRI of liver, she states she has been to [place] and had some blood work that was back to normal    Patient Active Problem List   Diagnosis Date Noted  . Left Achilles tendinitis 10/26/2017  . Dental infection 04/06/2016  . Neck pain 04/06/2016  . BPPV (benign paroxysmal positional vertigo) 02/22/2016  . Perennial allergic rhinitis 02/22/2016  . Metabolic syndrome 02/33/4356  . Eustachian tube dysfunction 09/15/2015  . Asthma, mild intermittent, poorly controlled 09/15/2015  . Cervical spondylosis 08/29/2015  . Overweight (BMI 25.0-29.9) 06/29/2015  . IBS (irritable bowel syndrome) 06/29/2015  . 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 UPJ (ureteropelvic junction) obstruction 06/22/2015  . Cervical radiculitis 07/19/2014  . 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 Dysautonomia ([place]) 10/01/2012  . History of syncope 07/07/2012  . Depression with anxiety 06/11/2011  . Dyslipidemia 11/02/2010    Past Surgical History:  Procedure Laterality Date  . APPENDECTOMY    . CESAREAN SECTION     x2  . CHOLECYSTECTOMY    . neurocardiogenic syncope    . PATENT FORAMEN OVALE CLOSURE    . SURGERY ON FACE     AFTER FALL  . WRIST SURGERY     broken wrist,left    Family History  Problem Relation Age of Onset  . Hypertension Mother   . Heart disease Father 79       MI    Social History   Socioeconomic History  . Marital status: Married    Spouse name: Not on file  . Number of children: Not on file  . Years of education: Not on file  . Highest education level: Not on file  Occupational History  . Not on file  Social Needs  . Financial resource strain: Not on file  . Food insecurity:    Worry: Not on file    Inability: Not on file  . Transportation needs:    Medical: Not on file    Non-medical: Not on file  Tobacco Use  . Smoking status: Former Smoker  Packs/day: 1.00    Years: 5.00    Pack years: 5.00    Types: Cigarettes    Last attempt to quit: 02/11/2006    Years since quitting: 12.0  . Smokeless tobacco: Never Used  Substance and Sexual Activity  . Alcohol use: No    Alcohol/week: 0.0 oz  . Drug use: No  . Sexual activity: Yes    Partners: Male  Lifestyle  . Physical activity:    Days per week: Not on file    Minutes per session: Not on file  . Stress: Not on file  Relationships  . Social connections:    Talks on phone: Not on file    Gets together: Not on file    Attends religious service: Not on file    Active member of club or organization: Not on file    Attends meetings of clubs or organizations: Not on file    Relationship status: Not on file  .  Intimate partner violence:    Fear of current or ex partner: Not on file    Emotionally abused: Not on file    Physically abused: Not on file    Forced sexual activity: Not on file  Other Topics Concern  . Not on file  Social History Narrative  . Not on file     Current Outpatient Medications:  .  BIOTIN PO, Take 1 tablet by mouth daily., Disp: , Rfl:  .  levofloxacin (LEVAQUIN) 750 MG tablet, , Disp: , Rfl:  .  Multiple Vitamin (MULTIVITAMIN) tablet, Take 1 tablet by mouth daily., Disp: , Rfl:   Allergies  Allergen Reactions  . Atorvastatin Swelling  . Ciprofloxacin Hcl Other (See Comments)    Heart condition  Lowers blood pressure  . Cyclobenzaprine Other (See Comments)    Angina; lowers BP  . Nortriptyline Other (See Comments)    Angina, lowers BP  . Pravastatin Other (See Comments)    gingivitis  . Prednisone Other (See Comments)    Has heart condition causes BP to gt too low.  . Sulfamethizole Hives     ROS  Constitutional: Negative for fever or weight change.  Respiratory: Negative for cough and shortness of breath.   Cardiovascular: Negative for chest pain or palpitations.  Gastrointestinal: Positive  for abdominal pain, but  bowel changes.  Musculoskeletal: Negative for gait problem or joint swelling.  Skin: Negative for rash.  Neurological: Negative for dizziness or headache.  No other specific complaints in a complete review of systems (except as listed in HPI above).   Objective  Vitals:   02/12/18 0826  BP: 120/76  Pulse: 95  Resp: 14  Temp: 97.7 F (36.5 C)  TempSrc: Oral  SpO2: 98%  Weight: 157 lb 8 oz (71.4 kg)  Height: _0  (1.651 m)    Body mass index is 26.21 kg/m.  Physical Exam  Constitutional: Patient appears well-developed and well-nourished.  No distress.  HEENT: head atraumatic, normocephalic, pupils equal and reactive to light,  neck supple, throat within normal limits Cardiovascular: Normal rate, regular rhythm and normal  heart sounds.  No murmur heard. No BLE edema. Pulmonary/Chest: Effort normal and breath sounds normal. No respiratory distress. Abdominal: Soft.  There is tenderness lower abdomen, no guarding, also pain on lower back, tender on right flank, CVA, but not on left where the pain originally started. Marland Kitchen Psychiatric: Patient has a normal mood and affect. behavior is normal. Judgment and thought content normal.  Recent Results (from the past 2160  hour(s))  Basic metabolic panel     Status: Abnormal   Collection Time: 02/05/18  3:58 PM  Result Value Ref Range   Sodium 140 135 - 145 mmol/L   Potassium 4.4 3.5 - 5.1 mmol/L   Chloride 104 101 - 111 mmol/L   CO2 29 22 - 32 mmol/L   Glucose, Bld 114 (H) 65 - 99 mg/dL   BUN 26 (H) 6 - 20 mg/dL   Creatinine, Ser 0.75 0.44 - 1.00 mg/dL   Calcium 9.3 8.9 - 10.3 mg/dL   GFR calc non Af Amer >60 >60 mL/min   GFR calc Af Amer >60 >60 mL/min    Comment: (NOTE) The eGFR has been calculated using the CKD EPI equation. This calculation has not been validated in all clinical situations. eGFR's persistently <60 mL/min signify possible Chronic Kidney Disease.    Anion gap 7 5 - 15    Comment: Performed at [facility], [address] [place], [place] [address]  CBC     Status: None   Collection Time: 02/05/18  3:58 PM  Result Value Ref Range   WBC 9.0 3.6 - 11.0 K/uL   RBC 4.88 3.80 - 5.20 MIL/uL   Hemoglobin 14.3 12.0 - 16.0 g/dL   HCT 41.7 35.0 - 47.0 %   MCV 85.5 80.0 - 100.0 fL   MCH 29.4 26.0 - 34.0 pg   MCHC 34.4 32.0 - 36.0 g/dL   RDW 13.7 11.5 - 14.5 %   Platelets 236 150 - 440 K/uL    Comment: Performed at [facility], [address] [place], [place] [address]  Troponin I     Status: None   Collection Time: 02/05/18  3:58 PM  Result Value Ref Range   Troponin I <0.03 <0.03 ng/mL    Comment: Performed at [facility], [address] [place], [place] [address]     PHQ2/9: Depression screen [facility] 2/9  09/25/2016 05/14/2016 04/11/2016 12/18/2015 10/03/2015  Decreased Interest 0 0 0 0 0  Down, Depressed, Hopeless 0 0 0 0 0  PHQ - 2 Score 0 0 0 0 0     Fall Risk: Fall Risk  02/12/2018 09/25/2016 05/14/2016 04/11/2016 12/18/2015  Falls in the past year? _0   Number falls in past yr: - - - - -  Injury with Fall? - - - - -  Comment - - - - -    Functional Status Survey: Is the patient deaf or have difficulty hearing?: No Does the patient have difficulty seeing, even when wearing glasses/contacts?: No Does the patient have difficulty concentrating, remembering, or making decisions?: No Does the patient have difficulty walking or climbing stairs?: No Does the patient have difficulty dressing or bathing?: No Does the patient have difficulty doing errands alone such as visiting a doctor's office or shopping?: No    Assessment & Plan  1. Kidney stones  She is currently taking Levaquin, but not tolerating it well, we will change antibiotics per patient request.  - CULTURE, URINE COMPREHENSIVE - COMPLETE METABOLIC PANEL WITH GFR - CBC with Differential/Platelet - Ambulatory referral to Urology - amoxicillin-clavulanate (AUGMENTIN) 875-125 MG tablet; Take 1 tablet by mouth 2 (two) times daily.  Dispense: 20 tablet; Refill: 0  2. Dysautonomia [facility])  She would like to see [name] for follow up again   3. Need for Tdap vaccination  - Tdap vaccine greater than or equal to 7yo IM  4. Elevated liver enzymes  -  COMPLETE METABOLIC PANEL WITH GFR - Ambulatory referral to Gastroenterology ( she would like to go to [place])

## 2018-02-14 LAB — CBC WITH DIFFERENTIAL/PLATELET
Basophils Absolute: 19 cells/uL (ref 0–200)
Basophils Relative: 0.3 %
EOS PCT: 1.1 %
Eosinophils Absolute: 70 cells/uL (ref 15–500)
HEMATOCRIT: 45.8 % — AB (ref 35.0–45.0)
HEMOGLOBIN: 15.5 g/dL (ref 11.7–15.5)
LYMPHS ABS: 2342 {cells}/uL (ref 850–3900)
MCH: 28.8 pg (ref 27.0–33.0)
MCHC: 33.8 g/dL (ref 32.0–36.0)
MCV: 85 fL (ref 80.0–100.0)
MPV: 9.6 fL (ref 7.5–12.5)
Monocytes Relative: 5.3 %
NEUTROS ABS: 3629 {cells}/uL (ref 1500–7800)
Neutrophils Relative %: 56.7 %
Platelets: 241 10*3/uL (ref 140–400)
RBC: 5.39 10*6/uL — ABNORMAL HIGH (ref 3.80–5.10)
RDW: 13 % (ref 11.0–15.0)
Total Lymphocyte: 36.6 %
WBC: 6.4 10*3/uL (ref 3.8–10.8)
WBCMIX: 339 {cells}/uL (ref 200–950)

## 2018-02-14 LAB — COMPLETE METABOLIC PANEL WITHOUT GFR
AG Ratio: 1.6 (calc) (ref 1.0–2.5)
ALT: 21 U/L (ref 6–29)
AST: 19 U/L (ref 10–35)
Albumin: 4.4 g/dL (ref 3.6–5.1)
Alkaline phosphatase (APISO): 82 U/L (ref 33–130)
BUN: 18 mg/dL (ref 7–25)
CO2: 28 mmol/L (ref 20–32)
Calcium: 9.3 mg/dL (ref 8.6–10.4)
Chloride: 106 mmol/L (ref 98–110)
Creat: 0.63 mg/dL (ref 0.50–1.05)
GFR, Est African American: 120 mL/min/1.73m2
GFR, Est Non African American: 103 mL/min/1.73m2
Globulin: 2.8 g/dL (ref 1.9–3.7)
Glucose, Bld: 88 mg/dL (ref 65–139)
Potassium: 4.2 mmol/L (ref 3.5–5.3)
Sodium: 140 mmol/L (ref 135–146)
Total Bilirubin: 0.5 mg/dL (ref 0.2–1.2)
Total Protein: 7.2 g/dL (ref 6.1–8.1)

## 2018-02-14 LAB — CULTURE, URINE COMPREHENSIVE
MICRO NUMBER:: 90479991
RESULT:: NO GROWTH
SPECIMEN QUALITY:: ADEQUATE

## 2018-03-12 ENCOUNTER — Encounter: Payer: Self-pay | Admitting: Internal Medicine

## 2018-03-12 ENCOUNTER — Ambulatory Visit: Payer: Federal, State, Local not specified - PPO | Admitting: Internal Medicine

## 2018-03-12 VITALS — BP 110/69 | HR 67 | Ht 66.0 in | Wt 162.0 lb

## 2018-03-12 DIAGNOSIS — R002 Palpitations: Secondary | ICD-10-CM | POA: Diagnosis not present

## 2018-03-12 DIAGNOSIS — R079 Chest pain, unspecified: Secondary | ICD-10-CM

## 2018-03-12 DIAGNOSIS — G901 Familial dysautonomia [Riley-Day]: Secondary | ICD-10-CM | POA: Diagnosis not present

## 2018-03-12 NOTE — Progress Notes (Signed)
Patient Care Team: Steele Sizer, MD as PCP - General (Family Medicine)   HPI  Lori [name] is a 52 y.o. female Seen in followup for palpitations in the context of what I thought on evaluation December 2013 was dysautonomia. She had accompanying lightheadedness residual fatigue and chest discomfort.prior evaluation had also suggested that she had a "hole in the heart". Recent echocardiogram did not support that diagnosis. Event recorder previously had demonstrated PVCs.    She has begun her m enopause and is now without bleding   The summer was much easier, she had been able to exercise and had lost 25 lbs more recently, she has gained weight again.  She is having problems with recurrent lightheadedness particularly with the heat.  This is impacting her exercise.  Salt and water repletion seen quite adequate.  Emotionally she is more labile again.     Past Medical History:  Diagnosis Date  . Asthma   . Cancer, uterine ([place]) 1998  . Chest pain    normal LV function by echo 10/2010  . High cholesterol   . 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 Kidney stone   . Palpitations     Past Surgical History:  Procedure Laterality Date  . APPENDECTOMY    . CESAREAN SECTION     x2  . CHOLECYSTECTOMY    . neurocardiogenic syncope    . PATENT FORAMEN OVALE CLOSURE    . SURGERY ON FACE     AFTER FALL  . WRIST SURGERY     broken wrist,left    Current Outpatient Medications  Medication Sig Dispense Refill  . BIOTIN PO Take 1 tablet by mouth daily.    . Collagen Hydrolysate, Bovine, POWD by Does not apply route.    . Multiple Vitamin (MULTIVITAMIN) tablet Take 1 tablet by mouth daily.     No current facility-administered medications for this visit.     Allergies  Allergen Reactions  . Atorvastatin Swelling  . Ciprofloxacin Hcl Other (See Comments)    Heart condition  Lowers blood pressure  . Cyclobenzaprine Other (See Comments)    Angina; lowers BP  . Levaquin  [Levofloxacin In D5w]     Tremors, nausea  . Nortriptyline Other (See Comments)    Angina, lowers BP  . Pravastatin Other (See Comments)    gingivitis  . Prednisone Other (See Comments)    Has heart condition causes BP to gt too low.  Francine Graven Hives    Review of Systems negative except from HPI and PMH  Physical Exam BP 110/69 (BP Location: Left Arm, Patient Position: Sitting, Cuff Size: Normal)   Pulse 67   Ht 5\' 6"  (1.676 m)   Wt 162 lb (73.5 kg)   BMI 26.15 kg/m  Well developed and nourished in no acute distress HENT normal Neck supple with JVP-flat Clear Regular rate and rhythm, no murmurs or gallops Abd-soft with active BS No Clubbing cyanosis edema Skin-warm and dry A & Oriented  Grossly normal sensory and motor function   ECG demonstrates sinus at 67 13/07/39 Otherwise normal  Assessment and  Plan  Dysautonomia  Palpitations  anxiety    Not tolerating the heat well.  We discussed the role of compressive clothing and the importance of exercise.  She is exercising regularly.  Blood pressures are quite labile.  Emotionally she is struggling again.  Continue to push salt and water ( she is even using baking soda)  We spent more than 50% of our >  25 min visit in face to face counseling regarding the above

## 2018-03-12 NOTE — Patient Instructions (Signed)

## 2018-03-19 ENCOUNTER — Ambulatory Visit: Payer: Self-pay | Admitting: Obstetrics and Gynecology

## 2018-03-24 ENCOUNTER — Encounter: Payer: Federal, State, Local not specified - PPO | Admitting: Certified Nurse Midwife

## 2018-03-30 ENCOUNTER — Ambulatory Visit: Payer: Federal, State, Local not specified - PPO | Admitting: Urology

## 2018-03-30 ENCOUNTER — Encounter: Payer: Self-pay | Admitting: Urology

## 2018-05-27 ENCOUNTER — Other Ambulatory Visit: Payer: Self-pay | Admitting: Family Medicine

## 2018-05-27 DIAGNOSIS — J4520 Mild intermittent asthma, uncomplicated: Secondary | ICD-10-CM

## 2018-05-27 NOTE — Telephone Encounter (Signed)
Copied from [place] [id number]. Topic: Quick Communication - Rx Refill/Question >> May 27, 2018 11:51 AM Scherrie Gerlach wrote: Medication:  albuterol (PROAIR HFA) 108 (90 Base) MCG/ACT inhaler Husband calling to request a refill of this med.  Not prescribed since 2017, but he states pt is having asthma issues.  Pt went to [facility] and prescribed some other meds, but he state this helps her the most.  Last OV 02/13/2018  [id number] [address], [place] [phone] (Phone) [phone] (Fax)

## 2018-05-28 MED ORDER — ALBUTEROL SULFATE HFA 108 (90 BASE) MCG/ACT IN AERS: 2.0000 | INHALATION_SPRAY | Freq: Four times a day (QID) | RESPIRATORY_TRACT | 0 refills | Status: DC | PRN

## 2018-05-28 NOTE — Telephone Encounter (Signed)
Refill request was sent to Dr. Krichna Sowles for approval and submission.  

## 2018-06-25 ENCOUNTER — Other Ambulatory Visit: Payer: Self-pay | Admitting: Family Medicine

## 2018-07-09 ENCOUNTER — Encounter: Payer: Self-pay | Admitting: Certified Nurse Midwife

## 2018-07-09 ENCOUNTER — Ambulatory Visit (INDEPENDENT_AMBULATORY_CARE_PROVIDER_SITE_OTHER): Payer: Federal, State, Local not specified - PPO | Admitting: Certified Nurse Midwife

## 2018-07-09 VITALS — BP 124/72 | HR 61 | Ht 66.0 in | Wt 164.6 lb

## 2018-07-09 DIAGNOSIS — Z1239 Encounter for other screening for malignant neoplasm of breast: Secondary | ICD-10-CM

## 2018-07-09 DIAGNOSIS — Z01419 Encounter for gynecological examination (general) (routine) without abnormal findings: Secondary | ICD-10-CM

## 2018-07-09 DIAGNOSIS — Z1231 Encounter for screening mammogram for malignant neoplasm of breast: Secondary | ICD-10-CM | POA: Diagnosis not present

## 2018-07-09 DIAGNOSIS — N898 Other specified noninflammatory disorders of vagina: Secondary | ICD-10-CM

## 2018-07-09 DIAGNOSIS — E663 Overweight: Secondary | ICD-10-CM | POA: Diagnosis not present

## 2018-07-09 DIAGNOSIS — Z23 Encounter for immunization: Secondary | ICD-10-CM | POA: Diagnosis not present

## 2018-07-09 DIAGNOSIS — Z01411 Encounter for gynecological examination (general) (routine) with abnormal findings: Secondary | ICD-10-CM | POA: Diagnosis not present

## 2018-07-09 NOTE — Patient Instructions (Addendum)
Vaginitis  (Vaginitis)  La vaginitis es la inflamacin de la vagina. Generalmente se debe a un cambio en el equilibrio normal de las bacterias y hongos que viven en la vagina. Este cambio en el equilibrio causa un crecimiento excesivo de ciertas bacterias y hongos, lo que causa la inflamacin. Hay diferentes tipos de vaginitis, pero los ms comunes son:   Vaginitis bacteriana.  Infeccin por hongos (candidiasis).  Vaginitis por tricomoniasis. Esta es una enfermedad de transmisin sexual (ETS).  Vaginitis viral.  Vaginitis atrfica.  Vaginitis alrgica. CAUSAS  El tratamiento depende del tipo de vaginitis. Las causas pueden ser:   Bacterias (vaginitis bacteriana).  Hongos (infeccin por hongos).  Parsitos (vaginitis por tricomoniasis).  Virus (vaginitis viral).  Niveles hormonales bajos (vaginitis atrfica). Los niveles bajos de hormonas pueden ocurrir durante el Media planner, la Transport planner o despus de la menopausia.  Irritantes, como los baos de [place], los tampones perfumados y los aerosoles femeninos (vaginitis IT consultant). Otros factores pueden alterar el equilibrio normal de los hongos y las bacterias que viven en la vagina. Ellos son:   Antibiticos.  Higiene personal deficiente.  Diafragmas, esponjas vaginales, espermicidas, pldoras anticonceptivas y dispositivos intrauterinos (DIU).  [place].  Infecciones.  La diabetes no controlada.  Tener un sistema inmunolgico debilitado. SNTOMAS  Los sntomas pueden variar segn la causa de la vaginitis. Los sntomas ms comunes son:   Flujo vaginal anormal. ? La secrecin es de color blanco, gris o amarillento en la vaginitis bacteriana. ? La secrecin es espesa, blanca y con apariencia de queso en la infeccin por hongos. ? La secrecin es espumosa y de color amarillo o verdoso en la tricomoniasis.  Mal olor vaginal. ? En la vaginitis bacteriana puede haber olor a "pescado".  Picazn, dolor o  hinchazn vaginal.  Relaciones sexuales dolorosas.  Dolor o ardor al Garment/textile technologist. En ocasiones puede no haber sntomas.  TRATAMIENTO  El tratamiento depende de la gravedad de la lesin.   La vaginitis bacteriana y la tricomoniasis a menudo se tratan con cremas o comprimidos antibiticos.  Las infecciones por hongos se tratan con medicamentos antifngicos, como cremas o supositorios vaginales.  La vaginitis viral no tiene [place], [employer] los sntomas pueden tratarse con medicamentos que [employer]. Su pareja sexual tambin debe ser tratarse.  La vaginitis atrfica puede tratarse con crema, comprimidos, supositorios, o anillo vaginal con estrgenos. Si tiene sequedad vaginal, los lubricantes y las cremas hidratantes pueden ayudarla. Posiblemente le pedirn que evite los [place], aerosoles o duchas perfumados.  El tratamiento de la vaginitis alrgica implica renunciar al uso del producto que est causando el problema. Las cremas vaginales pueden usarse para tratar los sntomas. [place] todos los medicamentos segn le indic su mdico.  Mantenga la zona vaginal limpia y seca. Evite el jabn y slo enjuague el rea con agua.  Evite la ducha vaginal. Puede eliminar las bacterias saludables que hay en la vagina.  No utilice tampones ni tenga relaciones sexuales hasta que el profesional la autorice. No use apsitos mientras tenga vaginitis.  Higiencese de adelante hacia atrs. Esto evita la propagacin de bacterias desde el recto hacia la vagina.  Deje que el aire llegue a su rea genital. ? Use ropa interior de algodn para reducir la acumulacin de humedad. ? Evite el uso de ropa interior cuando duerme hasta que la vaginitis haya mejorado. ? Evite la ropa interior o medias de nylon ajustadas y que no tengan un panel de algodn. ? Gunnar Fusi  la ropa hmeda (especialmente el traje de bao) tan pronto como sea posible.  Utilice productos suaves sin  perfume. Evite el uso de sustancias irritantes como: ? Aerosoles femeninos perfumados. ? Suavizantes de tela. ? Detergentes perfumados. ? Tampones perfumados. ? Jabones o baos de espuma perfumados.  Practique el sexo seguro y use condones. Los condones pueden prevenir la transmisin de la tricomoniasis y la vaginitis viral. Dana Allan ATENCIN MDICA SI:   Siente dolor abdominal.  Tiene fiebre o sntomas persistentes durante ms de 2  3 das.  Tiene fiebre y los sntomas empeoran repentinamente. Esta informacin no tiene Marine scientist el consejo del mdico. Asegrese de hacerle al mdico cualquier pregunta que tenga. Document Released: 01/30/2009 Document Revised: 07/08/2012 Elsevier Interactive Patient Education  2017 [place] preventivos entre os 40-64 anos, [employer] Preventive Care 40-64 Years, Female Cuidados preventivos se referem a escolhas de estilo de vida e consultas com seu mdico capazes de promover a sade e o bem-estar. O que os cuidados preventivos incluem?  Um exame fsico anual. Ele tambm  chamado check-up anual.  Exames dentais uma ou duas vezes ao ano.  Exames oftalmolgicos rotineiros. Pergunte ao seu mdico com que frequncia seus olhos devem ser examinados.  Escolhas pessoais de estilo de vida, incluindo: ? Cuidado dirio dos seus dentes e gengivas. ? Atividade fsica regular. ? Consumir uma dieta saudvel. ? Evitar o tabaco e o uso de drogas. ? Limitar o uso de lcool. ? Academic librarian. ? Tomar baixa dose de aspirina diariamente comeando aos 50 anos de idade. ? Tomar suplementos de vitaminas e minerais de acordo com as recomendaes do seu mdico. O que acontece durante o check-up anual? Os servios e exames preventivos realizados pelo seu mdico durante o check-up anual dependero da sua idade, sade geral, fatores de risco relacionados ao estilo de vida e histrico familiar de doenas. Aconselhamento Seu mdico tambm  poder fazer perguntas sobre:  Uso de lcool.  Uso de tabaco.  Uso de drogas.  O bem-estar emocional.  O bem-estar em casa e nos relacionamentos.  A atividade sexual.  Os hbitos alimentares.  O trabalho e o ambiente de trabalho.  O mtodo anticoncepcional.  O ciclo menstrual.  O histrico de gravidez.  Exames preventivos Voc poder fazer os testes ou medies a seguir:  Suzanna Obey e [facility] (ndice de Art therapist).  Presso arterial.  Nveis de lipdeos e colesterol. Esses nveis podero ser verificados a cada 5 anos ou com maior frequncia caso voc tenha mais de 50 anos de idade.  [place].  Exame preventivo de cncer de pulmo. Voc poder fazer esse exame preventivo comeando aos 55 anos se tiver histrico de consumo de 30 unidades mao-ano e fume atualmente ou tenha parado nos ltimos 15 anos.  Exame de sangue oculto nas fezes (FOBT). Voc poder fazer este exame todos os anos comeando aos 50 anos.  Colonoscopia ou sigmoidoscopia flexvel. Voc poder fazer uma sigmoidoscopia a cada 5 anos ou uma colonoscopia a cada 10 anos comeando aos 50 anos.  Exame de sangue para hepatite C.  Exame de sangue para hepatite B.  Exame para doenas sexualmente transmissveis (DSTs).  Exame preventivo de diabetes. Isso  feito verificando-se o acar (glicose) no sangue depois de voc no comer por algum tempo (jejum). Isso poder ser feito a cada 1-3 anos.  Mamografia. Poder ser feita a cada 1-2 anos. Converse com seu mdico sobre a frequncia com a Community education officer. Isso poder depender de  voc ter ou no histrico familiar de cncer de mama.  Exame de preveno do cncer baseado em BRCA. Esse exame poder ser realizado caso voc tenha histrico de cncer de mama, de ovrio, das tubas uterinas ou de peritnio.  Exame plvico e de Papanicolau. Isso poder ser [employer] a cada 3 anos a [employer] 21 anos de idade. Comeando aos 30  anos de idade, esses exames podero ser feitos a cada 5 anos caso voc realize um exame de Papanicolau junto com um teste de HPV.  Exame da densidade ssea. Esse exame  feito para verificar a existncia de osteoporose. Voc poder fazer esse exame caso tenha risco elevado de osteoporose.  Discuta seus resultados, opes de tratamento e, se necessrio, a necessidade de mais exames com seu mdico. Vacinas Seu mdico poder recomendar certas vacinas, tais como:  Vacina contra gripe. Essa vacina  recomendada todos os anos.  Vacina contra ttano, difteria e coqueluche acelular (Tdap, Td). Voc poder precisar de um reforo da Td a cada 10 anos.  Vacina contra varicela (catapora). Voc poder precisar dessa vacina se ainda no tiver sido vacinado.  Vacina contra o herpes zster. Voc poder precisar dela a [employer] 60 anos.  Vacina contra sarampo, rubola e caxumba (MMR). Voc poder precisar de pelo menos uma dose da MMR se tiver nascido em 1957 ou posteriormente. Voc tambm poder precisar de uma segunda dose.  Vacina pneumoccica 13-valente conjugada (PCV13). Voc poder precisar dessa vacina se sofrer de certos quadros clnicos e ainda no tiver [employer].  Vacina pneumoccica polissacardica (PPSV23). Voc poder precisar de uma ou duas doses caso fume cigarros ou sofra de certos quadros clnicos.  Vacina meningoccica. Voc poder precisar dela se sofrer de certos quadros clnicos.  Vacina contra hepatite A. Voc poder precisar dessa vacina se apresentar certos quadros clnicos ou viajar para ou trabalhar em lugares nos quais pode ser exposto  hepatite A.  Vacina contra hepatite B. Voc poder precisar dessa vacina se apresentar certos quadros clnicos ou viajar para ou trabalhar em lugares nos quais pode ser exposto  hepatite B.  Vacina contra o Haemophilus influenzae tipo b (Hib). Voc poder precisar dela se sofrer de certos quadros clnicos.  Converse com seu mdico  sobre quais exames preventivos e vacinas voc precisar e com qual frequncia. Estas informaes no se destinam a substituir as recomendaes de seu mdico. No deixe de discutir quaisquer dvidas com seu mdico. Document Released: 02/18/2017 Document Revised: 02/18/2017 Elsevier Interactive Patient Education  [employer].

## 2018-07-11 LAB — NUSWAB VAGINITIS (VG)
Candida albicans, NAA: NEGATIVE
Candida glabrata, NAA: NEGATIVE
TRICH VAG BY NAA: NEGATIVE

## 2018-07-16 ENCOUNTER — Telehealth: Payer: Self-pay

## 2018-07-16 NOTE — Progress Notes (Signed)
Please contact. NuSwab negative for infection.

## 2018-07-16 NOTE — Telephone Encounter (Signed)
-----   Message from Diona Fanti, CNM sent at 07/16/2018 10:27 AM EDT ----- Please contact. NuSwab negative for infection.

## 2018-07-16 NOTE — Telephone Encounter (Signed)
Pt informed of neg cultures results per ML.

## 2018-07-23 NOTE — Progress Notes (Signed)
ANNUAL PREVENTATIVE CARE GYN  ENCOUNTER NOTE  Subjective:       Lori [name] is a 52 y.o. [id number] female here for a routine annual gynecologic exam.  Accompanied by daughter.   Advised by friends to discuss her decreased sexual desire. Reports vaginal dryness.   Denies difficulty breathing or respiratory distress, chest pain, abdominal pain, vaginal bleeding, dysuria, and leg pain or swelling.    Gynecologic History  No LMP recorded. (Menstrual status: Perimenopausal).  Contraception: post menopausal status  Last Pap: 10/2015. Results were: Negative/Negative  Last mammogram: 2017. Results were: normal  Obstetric History  OB History  Gravida Para Term Preterm AB Living  4 4 3 1   4   SAB TAB Ectopic Multiple Live Births          4    # Outcome Date GA Lbr Len/2nd Weight Sex Delivery Anes PTL Lv  4 Term 2008    M CS-Unspec   LIV  3 Preterm 1993    F CS-Unspec  Y LIV  2 Term 1986    M Vag-Spont   LIV  1 Term 1985    M Vag-Spont   LIV    Past Medical History:  Diagnosis Date  . Asthma   . Cancer, uterine ([place]) 1998  . Chest pain    normal LV function by echo 10/2010  . High cholesterol   . 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 Kidney stone   . Palpitations     Past Surgical History:  Procedure Laterality Date  . APPENDECTOMY    . CESAREAN SECTION     x2  . CHOLECYSTECTOMY    . neurocardiogenic syncope    . PATENT FORAMEN OVALE CLOSURE    . SURGERY ON FACE     AFTER FALL  . WRIST SURGERY     broken wrist,left    Current Outpatient Medications on File Prior to Visit  Medication Sig Dispense Refill  . albuterol (PROVENTIL HFA;VENTOLIN HFA) 108 (90 Base) MCG/ACT inhaler Inhale 2 puffs into the lungs every 6 (six) hours as needed for wheezing or shortness of breath. 1 Inhaler 0  . BIOTIN PO Take 1 tablet by mouth daily.    . Collagen Hydrolysate, Bovine, POWD by Does not apply route.    . Multiple Vitamin (MULTIVITAMIN) tablet Take 1 tablet by mouth daily.     No  current facility-administered medications on file prior to visit.     Allergies  Allergen Reactions  . Atorvastatin Swelling  . Ciprofloxacin Hcl Other (See Comments)    Heart condition  Lowers blood pressure  . Cyclobenzaprine Other (See Comments)    Angina; lowers BP  . Latex   . Levaquin [Levofloxacin In D5w]     Tremors, nausea  . Nortriptyline Other (See Comments)    Angina, lowers BP  . Pravastatin Other (See Comments)    gingivitis  . Prednisone Other (See Comments)    Has heart condition causes BP to gt too low.  Francine Graven Hives    Social History   Socioeconomic History  . Marital status: Married    Spouse name: Not on file  . Number of children: Not on file  . Years of education: Not on file  . Highest education level: Not on file  Occupational History  . Not on file  Social Needs  . Financial resource strain: Not on file  . Food insecurity:    Worry: Not on file    Inability: Not on file  .  Transportation needs:    Medical: Not on file    Non-medical: Not on file  Tobacco Use  . Smoking status: Former Smoker    Packs/day: 1.00    Years: 5.00    Pack years: 5.00    Types: Cigarettes    Last attempt to quit: 02/11/2006    Years since quitting: 12.4  . Smokeless tobacco: Never Used  Substance and Sexual Activity  . Alcohol use: No    Alcohol/week: 0.0 standard drinks  . Drug use: No  . Sexual activity: Not Currently    Partners: Male    Birth control/protection: Post-menopausal  Lifestyle  . Physical activity:    Days per week: Not on file    Minutes per session: Not on file  . Stress: Not on file  Relationships  . Social connections:    Talks on phone: Not on file    Gets together: Not on file    Attends religious service: Not on file    Active member of club or organization: Not on file    Attends meetings of clubs or organizations: Not on file    Relationship status: Not on file  . Intimate partner violence:    Fear of current or  ex partner: Not on file    Emotionally abused: Not on file    Physically abused: Not on file    Forced sexual activity: Not on file  Other Topics Concern  . Not on file  Social History Narrative  . Not on file    Family History  Problem Relation Age of Onset  . Hypertension Mother   . Heart disease Father 37       MI  . Cancer Maternal Aunt     The following portions of the patient's history were reviewed and updated as appropriate: allergies, current medications, past family history, past medical history, past social history, past surgical history and problem list.  Review of Systems  ROS negative except as noted above. Information obtained from patient and daughter.    Objective:   BP 124/72   Pulse 61   Ht 5\' 6"  (1.676 m)   Wt 164 lb 9 oz (74.6 kg)   BMI 26.56 kg/m    CONSTITUTIONAL: Well-developed, well-nourished female in no acute distress.   PSYCHIATRIC: Normal mood and affect. Normal behavior. Normal judgment and thought content.  [place]: Alert and oriented to person, place, and time. Normal muscle tone coordination. No cranial nerve deficit noted.  HENT:  Normocephalic, atraumatic, External right and left ear normal.  EYES: Conjunctivae and EOM are normal. Pupils are equal and round.    NECK: Normal range of motion, supple, no masses.  Normal thyroid.   SKIN: Skin is warm and dry. No rash noted. Not diaphoretic. No erythema. No pallor.  CARDIOVASCULAR: Normal heart rate noted, regular rhythm, no murmur.  RESPIRATORY: Clear to auscultation bilaterally. Effort and breath sounds normal, no problems with respiration noted.  BREASTS: Symmetric in size. No masses, skin changes, nipple drainage, or lymphadenopathy.  ABDOMEN: Soft, normal bowel sounds, no distention noted.  No tenderness, rebound or guarding.   PELVIC:  External Genitalia: Normal  Vagina: Normal  Cervix: Normal  Uterus: Normal  Adnexa: Normal    MUSCULOSKELETAL: Normal range of motion.  No tenderness.  No cyanosis, clubbing, or edema.  2+ distal pulses.  LYMPHATIC: No Axillary, Supraclavicular, or Inguinal Adenopathy.  Assessment:   Annual gynecologic examination 52 y.o.   Contraception: post menopausal status   Overweight  Problem List Items Addressed This Visit    None    Visit Diagnoses    Well woman exam    -  Primary   Relevant Orders   Flu Vaccine QUAD 36+ mos IM (Completed)   MM 3D SCREEN BREAST BILATERAL   NuSwab Vaginitis (VG) (Completed)   Flu vaccine need       Relevant Orders   Flu Vaccine QUAD 36+ mos IM (Completed)   Screening for breast cancer       Relevant Orders   MM 3D SCREEN BREAST BILATERAL   Vaginal dryness       Relevant Orders   NuSwab Vaginitis (VG) (Completed)      Plan:   Pap: Not needed  Mammogram: Ordered  Labs: NuSwab collected   Flu vaccine given, see orders  Discussed lack of sexual desire is only a concern if it is bothersome to her which she states it is not  Routine preventative health maintenance measures emphasized: Exercise/Diet/Weight control, Tobacco Warnings, Alcohol/Substance use risks and Stress Management; see AVS  Reviewed red flag symptoms and when to call  RTC x 1 year for ANNUAL EXAM or sooner if needed  Spanish interpreter Chip Boer present for duration of visit.    Diona Fanti, CNM [facility], [facility]

## 2018-09-18 ENCOUNTER — Encounter: Payer: Self-pay | Admitting: Podiatry

## 2018-09-18 ENCOUNTER — Ambulatory Visit (INDEPENDENT_AMBULATORY_CARE_PROVIDER_SITE_OTHER): Payer: Federal, State, Local not specified - PPO

## 2018-09-18 ENCOUNTER — Ambulatory Visit: Payer: Federal, State, Local not specified - PPO | Admitting: Podiatry

## 2018-09-18 VITALS — BP 118/66 | HR 67

## 2018-09-18 DIAGNOSIS — M722 Plantar fascial fibromatosis: Secondary | ICD-10-CM

## 2018-09-21 NOTE — Progress Notes (Signed)
   Subjective: 52 year old female presenting today as a new patient with a chief complaint of pain to the plantar aspects of bilateral heels that began 10-11 years ago. She states the pain is gradually worsening and is most severe at night. She has been using inserts that she had made in [place] about ten years ago but states they do not help any longer. Patient is here for further evaluation and treatment.   Past Medical History:  Diagnosis Date  . Asthma   . Cancer, uterine ([place]) 1998  . Chest pain    normal LV function by echo 10/2010  . High cholesterol   . 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 Kidney stone   . Palpitations      Objective: Physical Exam General: The patient is alert and oriented x3 in no acute distress.  Dermatology: Skin is warm, dry and supple bilateral lower extremities. Negative for open lesions or macerations bilateral.   Vascular: Dorsalis Pedis and Posterior Tibial pulses palpable bilateral.  Capillary fill time is immediate to all digits.  Neurological: Epicritic and protective threshold intact bilateral.   Musculoskeletal: Tenderness to palpation to the plantar aspect of the bilateral heels along the plantar fascia. All other joints range of motion within normal limits bilateral. Strength 5/5 in all groups bilateral.   Radiographic exam: Normal osseous mineralization. Joint spaces preserved. No fracture/dislocation/boney destruction. No other soft tissue abnormalities or radiopaque foreign bodies.   Assessment: 1. plantar fasciitis bilateral feet  Plan of Care:  1. Patient evaluated. Xrays reviewed.   2. Injection of 0.5cc Celestone soluspan injected into the bilateral heels.  3. Plantar fascial band(s) dispensed for bilateral plantar fasciitis. 4. Patient states she cannot take any oral medications due to syncopal episodes.  5. Continue using custom orthotics that were made in [place].  6. Instructed patient regarding therapies and modalities  at home to alleviate symptoms.  7. Return to clinic in 4 weeks.    Edrick Kins, DPM [facility]  Dr. Edrick Kins, DPM    [address], [place] [address]                Office [phone]  Fax [phone]

## 2018-10-16 ENCOUNTER — Encounter: Payer: Federal, State, Local not specified - PPO | Admitting: Podiatry

## 2018-10-24 NOTE — Progress Notes (Signed)
This encounter was created in error - please disregard.

## 2018-11-17 ENCOUNTER — Ambulatory Visit (INDEPENDENT_AMBULATORY_CARE_PROVIDER_SITE_OTHER): Payer: Federal, State, Local not specified - PPO | Admitting: Podiatry

## 2018-11-17 ENCOUNTER — Encounter: Payer: Self-pay | Admitting: Podiatry

## 2018-11-17 DIAGNOSIS — M722 Plantar fascial fibromatosis: Secondary | ICD-10-CM

## 2018-11-19 ENCOUNTER — Ambulatory Visit (INDEPENDENT_AMBULATORY_CARE_PROVIDER_SITE_OTHER): Payer: Federal, State, Local not specified - PPO | Admitting: Orthotics

## 2018-11-19 DIAGNOSIS — M722 Plantar fascial fibromatosis: Secondary | ICD-10-CM | POA: Diagnosis not present

## 2018-11-19 DIAGNOSIS — M7662 Achilles tendinitis, left leg: Secondary | ICD-10-CM

## 2018-11-19 NOTE — Progress Notes (Signed)

## 2018-11-24 NOTE — Progress Notes (Signed)
   Subjective: 53 year old female presenting today as a new patient with a chief complaint of pain to the plantar aspects of bilateral heels that began 10-11 years ago. She states the pain is gradually worsening and is most severe at night. She has been using inserts that she had made in [place] about ten years ago but states they do not help any longer. Patient is here for further evaluation and treatment.   Past Medical History:  Diagnosis Date  . Asthma   . Cancer, uterine ([place]) 1998  . Chest pain    normal LV function by echo 10/2010  . High cholesterol   . 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 Kidney stone   . Palpitations      Objective: Physical Exam General: The patient is alert and oriented x3 in no acute distress.  Dermatology: Skin is warm, dry and supple bilateral lower extremities. Negative for open lesions or macerations bilateral.   Vascular: Dorsalis Pedis and Posterior Tibial pulses palpable bilateral.  Capillary fill time is immediate to all digits.  Neurological: Epicritic and protective threshold intact bilateral.   Musculoskeletal: Tenderness to palpation to the plantar aspect of the bilateral heels along the plantar fascia. All other joints range of motion within normal limits bilateral. Strength 5/5 in all groups bilateral.   Radiographic exam: Normal osseous mineralization. Joint spaces preserved. No fracture/dislocation/boney destruction. No other soft tissue abnormalities or radiopaque foreign bodies.   Assessment: 1. plantar fasciitis bilateral feet  Plan of Care:  1. Patient evaluated.  2. Injection of 0.5cc Celestone soluspan injected into the bilateral heels.  3. Appointment with Liliane Channel, Pedorthist, for custom molded orthotics.  4. Patient states she cannot take any oral medications due to syncopal episodes.  5. Continue using plantar fascial braces.  6. Return to clinic in 8 weeks. If not better, we will discuss surgery.      Edrick Kins,  DPM [facility]  Dr. Edrick Kins, DPM    [address], [place] [address]                Office [phone]  Fax [phone]

## 2018-12-09 ENCOUNTER — Ambulatory Visit (INDEPENDENT_AMBULATORY_CARE_PROVIDER_SITE_OTHER): Payer: Federal, State, Local not specified - PPO | Admitting: Orthotics

## 2018-12-09 DIAGNOSIS — M722 Plantar fascial fibromatosis: Secondary | ICD-10-CM

## 2018-12-09 DIAGNOSIS — M7662 Achilles tendinitis, left leg: Secondary | ICD-10-CM

## 2018-12-09 NOTE — Progress Notes (Signed)
Patient came in today to pick up custom made foot orthotics.  The goals were accomplished and the patient reported no dissatisfaction with said orthotics.  Patient was advised of breakin period and how to report any issues. 

## 2019-01-27 ENCOUNTER — Other Ambulatory Visit: Payer: Self-pay | Admitting: Family Medicine

## 2019-02-12 ENCOUNTER — Ambulatory Visit: Payer: Federal, State, Local not specified - PPO | Admitting: Family Medicine

## 2019-03-12 ENCOUNTER — Other Ambulatory Visit: Payer: Self-pay

## 2019-03-12 ENCOUNTER — Ambulatory Visit (INDEPENDENT_AMBULATORY_CARE_PROVIDER_SITE_OTHER): Payer: Federal, State, Local not specified - PPO | Admitting: Podiatry

## 2019-03-12 ENCOUNTER — Encounter: Payer: Self-pay | Admitting: Podiatry

## 2019-03-12 VITALS — Temp 98.1°F

## 2019-03-12 DIAGNOSIS — M722 Plantar fascial fibromatosis: Secondary | ICD-10-CM

## 2019-03-12 NOTE — Patient Instructions (Signed)
Pre-Operative Instructions  Congratulations, you have decided to take an important step towards improving your quality of life.  You can be assured that the doctors and staff at [facility] will be with you every step of the way.  Here are some important things you should know:  1. Plan to be at the surgery center/hospital at least 1 (one) hour prior to your scheduled time, unless otherwise directed by the surgical center/hospital staff.  You must have a responsible adult accompany you, remain during the surgery and drive you home.  Make sure you have directions to the surgical center/hospital to ensure you arrive on time. 2. If you are having surgery at [facility] or [facility] hospitals, you will need a copy of your medical history and physical form from your family physician within one month prior to the date of surgery. We will give you a form for your primary physician to complete.  3. We make every effort to accommodate the date you request for surgery.  However, there are times where surgery dates or times have to be moved.  We will contact you as soon as possible if a change in schedule is required.   4. No aspirin/ibuprofen for one week before surgery.  If you are on aspirin, any non-steroidal anti-inflammatory medications (Mobic, Aleve, Ibuprofen) should not be taken seven (7) days prior to your surgery.  You make take Tylenol for pain prior to surgery.  5. Medications - If you are taking daily heart and blood pressure medications, seizure, reflux, allergy, asthma, anxiety, pain or diabetes medications, make sure you notify the surgery center/hospital before the day of surgery so they can tell you which medications you should take or avoid the day of surgery. 6. No food or drink after midnight the night before surgery unless directed otherwise by surgical center/hospital staff. 7. No alcoholic beverages 24-hours prior to surgery.  No smoking 24-hours prior or 24-hours after  surgery. 8. Wear loose pants or shorts. They should be loose enough to fit over bandages, boots, and casts. 9. Don't wear slip-on shoes. Sneakers are preferred. 10. Bring your boot with you to the surgery center/hospital.  Also bring crutches or a walker if your physician has prescribed it for you.  If you do not have this equipment, it will be provided for you after surgery. 11. If you have not been contacted by the surgery center/hospital by the day before your surgery, call to confirm the date and time of your surgery. 12. Leave-time from work may vary depending on the type of surgery you have.  Appropriate arrangements should be made prior to surgery with your employer. 13. Prescriptions will be provided immediately following surgery by your doctor.  Fill these as soon as possible after surgery and take the medication as directed. Pain medications will not be refilled on weekends and must be approved by the doctor. 14. Remove nail polish on the operative foot and avoid getting pedicures prior to surgery. 15. Wash the night before surgery.  The night before surgery wash the foot and leg well with water and the antibacterial soap provided. Be sure to pay special attention to beneath the toenails and in between the toes.  Wash for at least three (3) minutes. Rinse thoroughly with water and dry well with a towel.  Perform this wash unless told not to do so by your physician.  Enclosed: 1 Ice pack (please put in freezer the night before surgery)   1 Hibiclens skin cleaner     Pre-op instructions  If you have any questions regarding the instructions, please do not hesitate to call our office.  [place]: [address], [place], [place] [address] -- [phone]  [place]: [address] [place], [place] [address] -- [phone]  [place]: [address]  [place], [place] [address] -- [phone]  [place]: [address], [place], [place] [address] -- [phone]  Website:  [email or web address] 

## 2019-03-15 NOTE — Progress Notes (Signed)
   Subjective: 53 year old female presenting today for follow up evaluation of plantar fasciitis of the bilateral feet. She reports continued significant pain in the heels. She states the pain is worse when she takes her shoes off. She has been using the plantar fascial braces as directed. Patient is here for further evaluation and treatment.   Past Medical History:  Diagnosis Date  . Asthma   . Cancer, uterine ([place]) 1998  . Chest pain    normal LV function by echo 10/2010  . High cholesterol   . 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 Kidney stone   . Palpitations      Objective: Physical Exam General: The patient is alert and oriented x3 in no acute distress.  Dermatology: Skin is warm, dry and supple bilateral lower extremities. Negative for open lesions or macerations bilateral.   Vascular: Dorsalis Pedis and Posterior Tibial pulses palpable bilateral.  Capillary fill time is immediate to all digits.  Neurological: Epicritic and protective threshold intact bilateral.   Musculoskeletal: Tenderness to palpation to the plantar aspect of the bilateral heels along the plantar fascia. All other joints range of motion within normal limits bilateral. Strength 5/5 in all groups bilateral.     Assessment: 1. plantar fasciitis bilateral feet - chronic   Plan of Care:  1. Patient evaluated.  2. Today we discussed the conservative versus surgical management of the presenting pathology. The patient opts for surgical management. All possible complications and details of the procedure were explained. All patient questions were answered. No guarantees were expressed or implied. 3. Authorization for surgery was initiated today. Surgery will consist of EPF bilaterally.  4. Post op shoes dispensed bilaterally.  5. Return to clinic one week post op.       Edrick Kins, DPM [facility]  Dr. Edrick Kins, DPM    [address],  [place] [address]                Office [phone]  Fax [phone]

## 2019-04-19 ENCOUNTER — Telehealth: Payer: Self-pay | Admitting: Obstetrics & Gynecology

## 2019-04-19 NOTE — Telephone Encounter (Signed)
[facility] referring for Pap/ CPE. Spoke with patient to schedule. Patient reports she doesn't need appointment is up to date on pap smear. Contacting Becky left voicemail

## 2019-05-14 ENCOUNTER — Other Ambulatory Visit: Payer: Self-pay

## 2019-05-14 ENCOUNTER — Emergency Department: Payer: Federal, State, Local not specified - PPO

## 2019-05-14 ENCOUNTER — Encounter: Payer: Self-pay | Admitting: *Deleted

## 2019-05-14 DIAGNOSIS — R079 Chest pain, unspecified: Secondary | ICD-10-CM | POA: Diagnosis present

## 2019-05-14 DIAGNOSIS — Z79899 Other long term (current) drug therapy: Secondary | ICD-10-CM | POA: Insufficient documentation

## 2019-05-14 DIAGNOSIS — Z87891 Personal history of nicotine dependence: Secondary | ICD-10-CM | POA: Diagnosis not present

## 2019-05-14 DIAGNOSIS — Z20828 Contact with and (suspected) exposure to other viral communicable diseases: Secondary | ICD-10-CM | POA: Diagnosis not present

## 2019-05-14 DIAGNOSIS — J45909 Unspecified asthma, uncomplicated: Secondary | ICD-10-CM | POA: Insufficient documentation

## 2019-05-14 DIAGNOSIS — K219 Gastro-esophageal reflux disease without esophagitis: Secondary | ICD-10-CM | POA: Diagnosis not present

## 2019-05-14 LAB — BASIC METABOLIC PANEL
Anion gap: 8 (ref 5–15)
BUN: 15 mg/dL (ref 6–20)
CO2: 27 mmol/L (ref 22–32)
Calcium: 8.9 mg/dL (ref 8.9–10.3)
Chloride: 107 mmol/L (ref 98–111)
Creatinine, Ser: 0.6 mg/dL (ref 0.44–1.00)
GFR calc Af Amer: >60 mL/min (ref >60)
GFR calc non Af Amer: >60 mL/min (ref >60)
Glucose, Bld: 125 mg/dL — ABNORMAL HIGH (ref 70–99)
Potassium: 4.2 mmol/L (ref 3.5–5.1)
Sodium: 142 mmol/L (ref 135–145)

## 2019-05-14 LAB — CBC
HCT: 40.5 % (ref 36.0–46.0)
Hemoglobin: 13.2 g/dL (ref 12.0–15.0)
MCH: 28.1 pg (ref 26.0–34.0)
MCHC: 32.6 g/dL (ref 30.0–36.0)
MCV: 86.4 fL (ref 80.0–100.0)
Platelets: 237 10*3/uL (ref 150–400)
RBC: 4.69 MIL/uL (ref 3.87–5.11)
RDW: 13.1 % (ref 11.5–15.5)
WBC: 9.5 10*3/uL (ref 4.0–10.5)
nRBC: 0 % (ref 0.0–0.2)

## 2019-05-14 LAB — TROPONIN I (HIGH SENSITIVITY): Troponin I (High Sensitivity): <2 ng/L (ref <18)

## 2019-05-14 MED ORDER — SODIUM CHLORIDE 0.9% FLUSH: 3.0000 mL | Freq: Once | INTRAVENOUS | Status: DC

## 2019-05-14 NOTE — ED Triage Notes (Signed)
Pt has chest pressure and congestion.  Hx asthma. Pt using inhaler with some relief.  Pt also reports nausea.  No cough or fever.   Pt alert.  Speech clear.

## 2019-05-15 ENCOUNTER — Emergency Department
Admission: EM | Admit: 2019-05-15 | Discharge: 2019-05-15 | Disposition: A | Discharge status: Home / Self Care | Payer: Federal, State, Local not specified - PPO | Attending: Emergency Medicine | Admitting: Emergency Medicine

## 2019-05-15 ENCOUNTER — Emergency Department: Payer: Federal, State, Local not specified - PPO

## 2019-05-15 DIAGNOSIS — R1013 Epigastric pain: Secondary | ICD-10-CM

## 2019-05-15 DIAGNOSIS — K219 Gastro-esophageal reflux disease without esophagitis: Secondary | ICD-10-CM

## 2019-05-15 DIAGNOSIS — R109 Unspecified abdominal pain: Secondary | ICD-10-CM

## 2019-05-15 LAB — SARS CORONAVIRUS 2 BY RT PCR (HOSPITAL ORDER, PERFORMED IN ~~LOC~~ HOSPITAL LAB): SARS Coronavirus 2: NEGATIVE

## 2019-05-15 LAB — HEPATIC FUNCTION PANEL
ALT: 31 U/L (ref 0–44)
AST: 25 U/L (ref 15–41)
Albumin: 4 g/dL (ref 3.5–5.0)
Alkaline Phosphatase: 70 U/L (ref 38–126)
Bilirubin, Direct: <0.1 mg/dL (ref 0.0–0.2)
Total Bilirubin: 0.3 mg/dL (ref 0.3–1.2)
Total Protein: 6.8 g/dL (ref 6.5–8.1)

## 2019-05-15 LAB — LIPASE, BLOOD: Lipase: 40 U/L (ref 11–51)

## 2019-05-15 MED ORDER — TRAMADOL HCL 50 MG PO TABS
50.0000 mg | ORAL_TABLET | Freq: Once | ORAL | Status: DC
  Filled 2019-05-15: qty 1

## 2019-05-15 MED ORDER — ONDANSETRON HCL 4 MG/2ML IJ SOLN
4.0000 mg | Freq: Once | INTRAMUSCULAR | Status: AC
  Administered 2019-05-15: 4 mg via INTRAVENOUS
  Filled 2019-05-15: qty 2

## 2019-05-15 MED ORDER — FAMOTIDINE 20 MG PO TABS: 20.0000 mg | ORAL_TABLET | Freq: Two times a day (BID) | ORAL | 0 refills | Status: DC

## 2019-05-15 MED ORDER — GADOBUTROL 1 MMOL/ML IV SOLN
7.0000 mL | Freq: Once | INTRAVENOUS | Status: AC | PRN
  Administered 2019-05-15: 7 mL via INTRAVENOUS

## 2019-05-15 MED ORDER — FAMOTIDINE IN NACL 20-0.9 MG/50ML-% IV SOLN
20.0000 mg | Freq: Once | INTRAVENOUS | Status: AC
  Administered 2019-05-15: 02:00:00 20 mg via INTRAVENOUS
  Filled 2019-05-15: qty 50

## 2019-05-15 NOTE — ED Notes (Signed)
NAD noted at time of D/C. Pt denies questions or concerns. Pt ambulatory to the lobby at this time to meet her husband.

## 2019-05-15 NOTE — ED Notes (Signed)
Patient reports having a "burning sensation" mid chest that causes her to feel short of breath.  Reports started Friday afternoon with nausea.  Patient is alert and oriented, skin warm and dry.  No acute distress noted.

## 2019-05-15 NOTE — Discharge Instructions (Signed)
1.  Start Pepcid 20 mg twice daily. 2.  Eat bland foods x3 days, then slowly advance diet as tolerated. 3.  Return to the ER for worsening symptoms, persistent vomiting, difficulty breathing or other concerns.

## 2019-05-15 NOTE — ED Notes (Signed)
This RN to bedside due to patient calling out, this RN apologized and explained delay, explained waiting for MRI results to be read. Pt c/o upper abd pain at this time. Explained will speak with MD at this time. Pt states understanding at this time.

## 2019-05-15 NOTE — ED Provider Notes (Signed)
[facility] Emergency Department Provider Note   ____________________________________________   First MD Initiated Contact with Patient 05/15/19 0142     (approximate)  I have reviewed the triage vital signs and the nursing notes.   HISTORY  Chief Complaint Chest Pain  History obtained via [employer] Spanish interpreter  HPI Lori [name] is a 53 y.o. female who presents to the ED from home with a chief complaint of epigastric/substernal chest burning and nasal congestion.  Symptoms x1 day.  Thought her allergies were acting up and has been using her rescue inhaler with some relief.  Complains of burning in her upper epigastrium/lower chest area associated with some nausea.  Denies fever, cough, shortness of breath, vomiting, dysuria, diarrhea.  Denies recent travel, trauma or exposure to persons diagnosed with coronavirus.  Had negative COVID test back in June for possible exposure.       Past Medical History:  Diagnosis Date  . Asthma   . Cancer, uterine ([place]) 1998  . Chest pain    normal LV function by echo 10/2010  . High cholesterol   . 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 Kidney stone   . Palpitations     Patient Active Problem List   Diagnosis Date Noted  . Left Achilles tendinitis 10/26/2017  . Dental infection 04/06/2016  . Neck pain 04/06/2016  . BPPV (benign paroxysmal positional vertigo) 02/22/2016  . Perennial allergic rhinitis 02/22/2016  . Metabolic syndrome 86[birth year]1950  . Eustachian tube dysfunction 09/15/2015  . Asthma, mild intermittent, poorly controlled 09/15/2015  . Cervical spondylosis 08/29/2015  . Overweight (BMI 25.0-29.9) 06/29/2015  . IBS (irritable bowel syndrome) 06/29/2015  . 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 UPJ (ureteropelvic junction) obstruction 06/22/2015  . Cervical radiculitis 07/19/2014  . 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 Dysautonomia ([place]) 10/01/2012  .  History of syncope 07/07/2012  . Depression with anxiety 06/11/2011  . Dyslipidemia 11/02/2010    Past Surgical History:  Procedure Laterality Date  . APPENDECTOMY    . CESAREAN SECTION     x2  . CHOLECYSTECTOMY    . neurocardiogenic syncope    . PATENT FORAMEN OVALE CLOSURE    . SURGERY ON FACE     AFTER FALL  . WRIST SURGERY     broken wrist,left    Prior to Admission medications   Medication Sig Start Date End Date Taking? Authorizing Provider  albuterol (PROVENTIL HFA;VENTOLIN HFA) 108 (90 Base) MCG/ACT inhaler TOME DOS INHALACIONES CADA SEIS HORAS CUANDO SEA NECESARIO FOR WHEEZING/ SHORTNESS OF BREATH 01/27/19   Sowles, Drue Stager, MD  atorvastatin (LIPITOR) 20 MG tablet TOME UNA TABLETA TODOS LOS D?AS 08/27/18   [provider]  BIOTIN PO Take 1 tablet by mouth daily.    [provider]  Collagen Hydrolysate, Bovine, POWD by Does not apply route.    [provider]  Estradiol (YUVAFEM) 10 MCG TABS vaginal tablet Yuvafem 10 mcg vaginal tablet    [provider]  famotidine (PEPCID) 20 MG tablet Take 1 tablet (20 mg total) by mouth 2 (two) times daily. 05/15/19   Paulette Blanch, MD  Multiple Vitamin (MULTIVITAMIN) tablet Take 1 tablet by mouth daily.    [provider]  PARoxetine (PAXIL) 10 MG tablet paroxetine 10 mg tablet    [provider]  simvastatin (ZOCOR) 10 MG tablet TOME UNA TABLETA TODOS LOS D AS 02/06/19   [provider]    Allergies Atorvastatin, Ciprofloxacin hcl, Cyclobenzaprine, Latex,  Levaquin [levofloxacin in d5w], Nortriptyline, Pravastatin, Prednisone, and Sulfamethizole  Family History  Problem Relation Age of Onset  . Hypertension Mother   . Heart disease Father 77       MI  . Cancer Maternal Aunt     Social History Social History   Tobacco Use  . Smoking status: Former Smoker    Packs/day: 1.00    Years: 5.00    Pack years: 5.00    Types: Cigarettes    Quit date: 02/11/2006    Years  since quitting: 13.2  . Smokeless tobacco: Never Used  Substance Use Topics  . Alcohol use: No    Alcohol/week: 0.0 standard drinks  . Drug use: No    Review of Systems  Constitutional: No fever/chills Eyes: No visual changes. ENT: No sore throat. Cardiovascular: Positive for lower chest burning sensation. Respiratory: Denies shortness of breath. Gastrointestinal: Positive for upper abdominal burning sensation and nausea, no vomiting.  No diarrhea.  No constipation. Genitourinary: Negative for dysuria. Musculoskeletal: Negative for back pain. Skin: Negative for rash. Neurological: Negative for headaches, focal weakness or numbness.   ____________________________________________   PHYSICAL EXAM:  VITAL SIGNS: ED Triage Vitals  Enc Vitals Group     BP 05/14/19 2134 119/63     Pulse Rate 05/14/19 2134 65     Resp 05/14/19 2134 20     Temp 05/14/19 2134 98.8 F (37.1 C)     Temp Source 05/14/19 2134 Oral     SpO2 05/14/19 2134 98 %     Weight --      Height --      Head Circumference --      Peak Flow --      Pain Score 05/14/19 2131 10     Pain Loc --      Pain Edu? --      Excl. in [place]? --     Constitutional: Alert and oriented. Well appearing and in no acute distress. Eyes: Conjunctivae are normal. PERRL. EOMI. Head: Atraumatic. Nose: No congestion/rhinnorhea. Mouth/Throat: Mucous membranes are moist.  Oropharynx non-erythematous. Neck: No stridor.   Cardiovascular: Normal rate, regular rhythm. Grossly normal heart sounds.  Good peripheral circulation. Respiratory: Normal respiratory effort.  No retractions. Lungs CTAB.  Mildly tender to palpation lower sternum/xiphoid. Gastrointestinal: Soft and mildly tender to palpation epigastrium without rebound or guarding. No distention. No abdominal bruits. No CVA tenderness. Musculoskeletal: No lower extremity tenderness nor edema.  No joint effusions. Neurologic:  Normal speech and language. No gross focal neurologic  deficits are appreciated.  Skin:  Skin is warm, dry and intact. No rash noted. Psychiatric: Mood and affect are normal. Speech and behavior are normal.  ____________________________________________   LABS (all labs ordered are listed, but only abnormal results are displayed)  Labs Reviewed  BASIC METABOLIC PANEL - Abnormal; Notable for the following components:      Result Value   Glucose, Bld 125 (*)    All other components within normal limits  SARS CORONAVIRUS 2 (HOSPITAL ORDER, [place] [facility])  CBC  HEPATIC FUNCTION PANEL  LIPASE, BLOOD  TROPONIN I (HIGH SENSITIVITY)   ____________________________________________  EKG  ED ECG REPORT I, Gurtej Noyola J, the attending physician, personally viewed and interpreted this ECG.   Date: 05/15/2019  EKG Time: 2139  Rate: 65  Rhythm: normal EKG, normal sinus rhythm  Axis: Normal  Intervals:none  ST&T Change: Nonspecific  ____________________________________________  RADIOLOGY  ED MD interpretation: No acute cardiopulmonary process  Official radiology  report(s): Dg Chest 2 View  Result Date: 05/14/2019 CLINICAL DATA:  Chest pain EXAM: CHEST - 2 VIEW COMPARISON:  02/05/2018 FINDINGS: Heart and mediastinal contours are within normal limits. No focal opacities or effusions. No acute bony abnormality. IMPRESSION: No active cardiopulmonary disease. Electronically Signed   By: Rolm Baptise M.D.   On: 05/14/2019 22:25   US Abdomen Limited Ruq  Result Date: 05/15/2019 CLINICAL DATA:  Burning epigastric pain.  Prior cholecystectomy. EXAM: ULTRASOUND ABDOMEN LIMITED RIGHT UPPER QUADRANT COMPARISON:  Right upper quadrant ultrasound 09/27/2016 FINDINGS: Gallbladder: Surgically absent. Common bile duct: Diameter: 5 mm. Possible 3 x 5 mm stone in the proximal common bile duct. Liver: No focal lesion identified. Diffusely increased in parenchymal echogenicity. No intrahepatic biliary ductal dilatation. Portal vein is  patent on color Doppler imaging with normal direction of blood flow towards the liver. IMPRESSION: 1. Postcholecystectomy. Possible choledocholithiasis, however no biliary ductal dilatation. Consider further evaluation with MRCP based on clinical concern. 2. Hepatic steatosis. Electronically Signed   By: Keith Rake M.D.   On: 05/15/2019 03:33    ____________________________________________   PROCEDURES  Procedure(s) performed (including Critical Care):  Procedures   ____________________________________________   INITIAL IMPRESSION / ASSESSMENT AND PLAN / ED COURSE  As part of my medical decision making, I reviewed the following data within the [place] notes reviewed and incorporated, Labs reviewed, EKG interpreted, Old chart reviewed, Radiograph reviewed and Notes from prior ED visits     Nailea Whitehorn was evaluated in Emergency Department on 05/15/2019 for the symptoms described in the history of present illness. She was evaluated in the context of the global COVID-19 pandemic, which necessitated consideration that the patient might be at risk for infection with the SARS-CoV-2 virus that causes COVID-19. Institutional protocols and algorithms that pertain to the evaluation of patients at risk for COVID-19 are in a state of rapid change based on information released by regulatory bodies including the CDC and federal and state organizations. These policies and algorithms were followed during the patient's care in the ED.   53 year old female with asthma who presents with a 1 day history of upper abdominal/lower chest burning sensation. Differential diagnosis includes, but is not limited to, biliary disease (biliary colic, acute cholecystitis, cholangitis, choledocholithiasis, etc), intrathoracic causes for epigastric abdominal pain including ACS, gastritis, duodenitis, pancreatitis, small bowel or large bowel obstruction, abdominal aortic aneurysm, hernia, and  ulcer(s).  High-sensitivity troponin is negative.  Will check LFTs and lipase, administer IV Pepcid for burning and IV Zofran for nausea.  Proceed with right upper quadrant abdominal ultrasound to evaluate for retained stones.   Clinical Course as of May 15 651  Sat May 15, 2019  0353 Updated patient of ultrasound results.  Will proceed with MRCP.  She is currently sleeping and reports significant relief after IV Pepcid.  Will obtain COVID swab in the event patient requires hospitalization/transfer.   [JS]  [id number] Patient sleeping in no acute distress.  Awaiting results of MRCP.  If negative, anticipate patient may be discharged home on Pepcid twice daily.  Care will be transferred to the oncoming provider Dr. Cherylann Banas.   [JS]    Clinical Course User Index [JS] Paulette Blanch, MD     ____________________________________________   FINAL CLINICAL IMPRESSION(S) / ED DIAGNOSES  Final diagnoses:  Epigastric pain  Gastroesophageal reflux disease, esophagitis presence not specified     ED Discharge Orders         Ordered    famotidine (PEPCID)  20 MG tablet  2 times daily     05/15/19 0211           Note:  This document was prepared using Dragon voice recognition software and may include unintentional dictation errors.   Paulette Blanch, MD 05/15/19 [phone]

## 2019-05-15 NOTE — ED Provider Notes (Signed)
-----------------------------------------   9:28 AM on 05/15/2019 -----------------------------------------  I took over care on this patient from Dr. Beather Arbour.  Patient was pending the read on an MRCP.  It is negative for choledocholithiasis or other acute findings.  The patient is feeling well and is stable for discharge home.  We will start her on an H2 blocker.  I discussed the results of the work-up with her.  I gave her thorough return precautions and she expressed understanding.   Arta Silence, MD 05/15/19 [phone]

## 2019-05-15 NOTE — ED Notes (Signed)
[facility] on call coming in @351 

## 2019-06-29 ENCOUNTER — Telehealth: Payer: Self-pay | Admitting: Internal Medicine

## 2019-06-29 NOTE — Telephone Encounter (Signed)
Spoke with pts husband who states his wife has been symptomatic with her autonomic issues. He states she has been having sudden drops in BP after periods of tachycardia, mainly happening in the evening or at night. He states he has measured HR's in the 40's with BP's in the 50's. He is not sure on the accuracy of his cuff or pulse ox. He states her HR increases and then when he rechecks later, it is reading in the 40's.  I advised him to seek care at the ED if this occurs again and she is symptomatic.  Pt has an appt with Tommye Standard, PA on 9/3 who will help address.   In the meantime, she should be sure she is drinking enough fluids and taking in her sodium throughout the day. Pt was advised to wear compression stockings and take her time when repositioning. She was advised to stay well hydrated. Pt's husband has verbalized understanding and had no additional questions.

## 2019-06-29 NOTE — Telephone Encounter (Signed)
° ° °  Spouse calling to report episodes of low HR at night.   STAT if HR is under 50 or over 120 (normal HR is 60-100 beats per minute)  1) What is your heart rate? 45 on Saturday. No other reading  2) Do you have a log of your heart rate readings (document readings)? no  3) Do you have any other symptoms? Spouse describes "seizure like" episodes at night
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
## 2019-07-01 ENCOUNTER — Other Ambulatory Visit: Payer: Self-pay

## 2019-07-01 ENCOUNTER — Other Ambulatory Visit: Payer: Self-pay | Admitting: Physician Assistant

## 2019-07-01 ENCOUNTER — Ambulatory Visit (INDEPENDENT_AMBULATORY_CARE_PROVIDER_SITE_OTHER): Payer: Federal, State, Local not specified - PPO | Admitting: Physician Assistant

## 2019-07-01 ENCOUNTER — Ambulatory Visit (INDEPENDENT_AMBULATORY_CARE_PROVIDER_SITE_OTHER): Payer: Federal, State, Local not specified - PPO

## 2019-07-01 ENCOUNTER — Encounter: Payer: Self-pay | Admitting: Physician Assistant

## 2019-07-01 VITALS — BP 125/74 | HR 63 | Ht 66.0 in | Wt 170.0 lb

## 2019-07-01 DIAGNOSIS — R55 Syncope and collapse: Secondary | ICD-10-CM | POA: Diagnosis not present

## 2019-07-01 DIAGNOSIS — G901 Familial dysautonomia [Riley-Day]: Secondary | ICD-10-CM

## 2019-07-01 DIAGNOSIS — R002 Palpitations: Secondary | ICD-10-CM

## 2019-07-01 LAB — BASIC METABOLIC PANEL
BUN/Creatinine Ratio: 22 (ref 9–23)
BUN: 15 mg/dL (ref 6–24)
CO2: 25 mmol/L (ref 20–29)
Calcium: 9 mg/dL (ref 8.7–10.2)
Chloride: 104 mmol/L (ref 96–106)
Creatinine, Ser: 0.69 mg/dL (ref 0.57–1.00)
GFR calc Af Amer: 114 mL/min/{1.73_m2} (ref >59)
GFR calc non Af Amer: 99 mL/min/{1.73_m2} (ref >59)
Glucose: 92 mg/dL (ref 65–99)
Potassium: 4.7 mmol/L (ref 3.5–5.2)
Sodium: 141 mmol/L (ref 134–144)

## 2019-07-01 LAB — CBC
Hematocrit: 43.2 % (ref 34.0–46.6)
Hemoglobin: 14.3 g/dL (ref 11.1–15.9)
MCH: 29.1 pg (ref 26.6–33.0)
MCHC: 33.1 g/dL (ref 31.5–35.7)
MCV: 88 fL (ref 79–97)
Platelets: 258 10*3/uL (ref 150–450)
RBC: 4.91 x10E6/uL (ref 3.77–5.28)
RDW: 13.4 % (ref 11.7–15.4)
WBC: 11.5 10*3/uL — ABNORMAL HIGH (ref 3.4–10.8)

## 2019-07-01 NOTE — Patient Instructions (Addendum)
  Medication Instructions:  Your physician recommends that you continue on your current medications as directed. Please refer to the Current Medication list given to you today.  If you need a refill on your cardiac medications before your next appointment, please call your pharmacy.   Lab work:  BMET AND CBC TODAY   If you have labs (blood work) drawn today and your tests are completely normal, you will receive your results only by: Marland Kitchen MyChart Message (if you have MyChart) OR . A paper copy in the mail If you have any lab test that is abnormal or we need to change your treatment, we will call you to review the results.  Testing/Procedures: ASAP Your physician has recommended that you wear an event monitor. Event monitors are medical devices that record the heart's electrical activity. Doctors most often [place] these monitors to diagnose arrhythmias. Arrhythmias are problems with the speed or rhythm of the heartbeat. The monitor is a small, portable device. You can wear one while you do your normal daily activities. This is usually used to diagnose what is causing palpitations/syncope (passing out).   Your physician has requested that you have an echocardiogram. Echocardiography is a painless test that uses sound waves to create images of your heart. It provides your doctor with information about the size and shape of your heart and how well your heart's chambers and valves are working. This procedure takes approximately one hour. There are no restrictions for this procedure.  Follow-Up: At [facility], you and your health needs are our priority.  As part of our continuing mission to provide you with exceptional heart care, we have created designated Provider Care Teams.  These Care Teams include your primary Cardiologist (physician) and Advanced Practice Providers (APPs -  Physician Assistants and Nurse Practitioners) who all work together to provide you with the care you need, when you need it. You  will need a follow up appointment in 3 weeks  With [name]  Can be Virtual    Any Other Special Instructions Will Be Listed Below (If Applicable).

## 2019-07-09 ENCOUNTER — Other Ambulatory Visit (HOSPITAL_COMMUNITY): Payer: Federal, State, Local not specified - PPO

## 2019-07-12 NOTE — Telephone Encounter (Signed)
noted 

## 2019-07-15 ENCOUNTER — Ambulatory Visit (HOSPITAL_COMMUNITY): Payer: Federal, State, Local not specified - PPO | Attending: Cardiovascular Disease

## 2019-07-15 ENCOUNTER — Other Ambulatory Visit: Payer: Self-pay

## 2019-07-15 DIAGNOSIS — R55 Syncope and collapse: Secondary | ICD-10-CM | POA: Diagnosis present

## 2019-07-15 DIAGNOSIS — R002 Palpitations: Secondary | ICD-10-CM | POA: Diagnosis not present

## 2019-07-15 DIAGNOSIS — G901 Familial dysautonomia [Riley-Day]: Secondary | ICD-10-CM | POA: Diagnosis not present

## 2019-07-22 ENCOUNTER — Other Ambulatory Visit: Payer: Self-pay | Admitting: Internal Medicine

## 2019-07-22 DIAGNOSIS — Z1231 Encounter for screening mammogram for malignant neoplasm of breast: Secondary | ICD-10-CM

## 2019-08-05 ENCOUNTER — Ambulatory Visit: Payer: Federal, State, Local not specified - PPO | Admitting: Certified Nurse Midwife

## 2019-08-05 ENCOUNTER — Other Ambulatory Visit (HOSPITAL_COMMUNITY)
Admission: RE | Admit: 2019-08-05 | Discharge: 2019-08-05 | Disposition: A | Discharge status: Home / Self Care | Payer: Federal, State, Local not specified - PPO | Source: Ambulatory Visit | Attending: Certified Nurse Midwife | Admitting: Certified Nurse Midwife

## 2019-08-05 ENCOUNTER — Encounter: Payer: Self-pay | Admitting: Certified Nurse Midwife

## 2019-08-05 ENCOUNTER — Other Ambulatory Visit: Payer: Self-pay

## 2019-08-05 VITALS — BP 106/68 | HR 80 | Ht 66.0 in | Wt 168.2 lb

## 2019-08-05 DIAGNOSIS — R300 Dysuria: Secondary | ICD-10-CM | POA: Insufficient documentation

## 2019-08-05 DIAGNOSIS — N898 Other specified noninflammatory disorders of vagina: Secondary | ICD-10-CM | POA: Insufficient documentation

## 2019-08-05 LAB — POCT URINALYSIS DIPSTICK
Bilirubin, UA: NEGATIVE
Glucose, UA: NEGATIVE
Ketones, UA: NEGATIVE
Leukocytes, UA: NEGATIVE
Nitrite, UA: NEGATIVE
Protein, UA: NEGATIVE
Spec Grav, UA: >=1.03 — AB (ref 1.010–1.025)
Urobilinogen, UA: 0.2 E.U./dL
pH, UA: 5 (ref 5.0–8.0)

## 2019-08-05 NOTE — Progress Notes (Signed)
Patient c/o pain with urination x2 days, used Metronidazole and a hydrating cream the last 2 nights with some relief.

## 2019-08-05 NOTE — Progress Notes (Signed)
GYN ENCOUNTER NOTE  Subjective:       Lori [name] is a 53 y.o. [id number] female is here for gynecologic evaluation of the following issues:  1. Vaginal irritation and burning with urination for the last two (2) days; mild relief with use of metrogel and hydrating vaginal cream.   Denies difficulty breathing or respiratory distress, chest pain, abdominal pain, vaginal bleeding, dysuria, and leg pain or swelling.    Gynecologic History  Patient's last menstrual period was 11/19/2015.  Contraception: post menopausal status  Last Pap: 2017. Results were: Negative/Negative  Last mammogram: 2017. Results were: normal  Obstetric History OB History  Gravida Para Term Preterm AB Living  6 4 3 1 2 4   SAB TAB Ectopic Multiple Live Births  2       4    # Outcome Date GA Lbr Len/2nd Weight Sex Delivery Anes PTL Lv  6 Term 03/20/07    M CS-Unspec Spinal  LIV  5 Preterm 08/02/92    F CS-Unspec Spinal Y LIV  4 Term 08/22/85    M Vag-Spont None  LIV  3 Term 02/13/84   10 lb (4.536 kg) M Vag-Spont None  LIV  2 SAB           1 SAB             Past Medical History:  Diagnosis Date  . Asthma   . Cancer, uterine ([place]) 1998  . Chest pain    normal LV function by echo 10/2010  . High cholesterol   . 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 Kidney stone   . Palpitations     Past Surgical History:  Procedure Laterality Date  . APPENDECTOMY    . CESAREAN SECTION     x2  . CHOLECYSTECTOMY    . neurocardiogenic syncope    . PATENT FORAMEN OVALE CLOSURE    . SURGERY ON FACE     AFTER FALL  . WRIST SURGERY     broken wrist,left    Current Outpatient Medications on File Prior to Visit  Medication Sig Dispense Refill  . albuterol (PROVENTIL HFA;VENTOLIN HFA) 108 (90 Base) MCG/ACT inhaler TOME DOS INHALACIONES CADA SEIS HORAS CUANDO SEA NECESARIO FOR WHEEZING/ SHORTNESS OF BREATH 6.7 Inhaler 0  . BIOTIN PO Take 1 tablet by mouth daily.    . Collagen Hydrolysate, Bovine, POWD by Does not apply  route.    . famotidine (PEPCID) 20 MG tablet Take 1 tablet (20 mg total) by mouth 2 (two) times daily. 60 tablet 0  . metroNIDAZOLE (METROCREAM) 0.75 % cream Apply 1 application topically as needed.    . Multiple Vitamin (MULTIVITAMIN) tablet Take 1 tablet by mouth daily.    . simvastatin (ZOCOR) 10 MG tablet TOME UNA TABLETA TODOS LOS D AS     No current facility-administered medications on file prior to visit.     Allergies  Allergen Reactions  . Atorvastatin Swelling  . Ciprofloxacin Hcl Other (See Comments)    Heart condition  Lowers blood pressure  . Cyclobenzaprine Other (See Comments)    Angina; lowers BP  . Latex   . Levaquin [Levofloxacin In D5w]     Tremors, nausea  . Nortriptyline Other (See Comments)    Angina, lowers BP  . Pravastatin Other (See Comments)    gingivitis  . Prednisone Other (See Comments)    Has heart condition causes BP to gt too low.  Francine Graven Hives    Social History   Socioeconomic  History  . Marital status: Married    Spouse name: Not on file  . Number of children: Not on file  . Years of education: Not on file  . Highest education level: Not on file  Occupational History  . Not on file  Social Needs  . Financial resource strain: Not on file  . Food insecurity    Worry: Not on file    Inability: Not on file  . Transportation needs    Medical: Not on file    Non-medical: Not on file  Tobacco Use  . Smoking status: Former Smoker    Packs/day: 1.00    Years: 5.00    Pack years: 5.00    Types: Cigarettes    Quit date: 02/11/2006    Years since quitting: 13.4  . Smokeless tobacco: Never Used  Substance and Sexual Activity  . Alcohol use: No    Alcohol/week: 0.0 standard drinks  . Drug use: No  . Sexual activity: Not Currently    Partners: Male    Birth control/protection: Post-menopausal  Lifestyle  . Physical activity    Days per week: Not on file    Minutes per session: Not on file  . Stress: Not on file   Relationships  . Social Herbalist on phone: Not on file    Gets together: Not on file    Attends religious service: Not on file    Active member of club or organization: Not on file    Attends meetings of clubs or organizations: Not on file    Relationship status: Not on file  . Intimate partner violence    Fear of current or ex partner: Not on file    Emotionally abused: Not on file    Physically abused: Not on file    Forced sexual activity: Not on file  Other Topics Concern  . Not on file  Social History Narrative  . Not on file    Family History  Problem Relation Age of Onset  . Hypertension Mother   . Heart disease Father 34       MI  . Cancer Maternal Aunt   . Uterine cancer Maternal Aunt   . Breast cancer Neg Hx   . Ovarian cancer Neg Hx   . Colon cancer Neg Hx     The following portions of the patient's history were reviewed and updated as appropriate: allergies, current medications, past family history, past medical history, past social history, past surgical history and problem list.  Review of Systems  ROS negative except as noted above. Information obtained from patient.   Objective:   BP 106/68   Pulse 80   Ht 5\' 6"  (1.676 m)   Wt 168 lb 3.2 oz (76.3 kg)   LMP 11/19/2015   BMI 27.15 kg/m    CONSTITUTIONAL: Well-developed, well-nourished female in no acute distress.    PELVIC:  External Genitalia: Erythema noted  Vagina: Erythema present   MUSCULOSKELETAL: Normal range of motion. No tenderness.  No cyanosis, clubbing, or edema.   POCT urinalysis dipstick     Status: Abnormal   Collection Time: 08/05/19 11:35 AM  Result Value Ref Range   Color, UA Yellow    Clarity, UA Clear    Glucose, UA Negative Negative   Bilirubin, UA Negative    Ketones, UA Negative    Spec Grav, UA >=1.030 (A) 1.010 - 1.025   Blood, UA Trace hemolysed    pH, UA 5.0 5.0 -  8.0   Protein, UA Negative Negative   Urobilinogen, UA 0.2 0.2 or 1.0 E.U./dL    Nitrite, UA Negative    Leukocytes, UA Negative Negative   Appearance     Odor       Assessment:   1. Dysuria  - POCT urinalysis dipstick - Cervicovaginal ancillary only - Urine Culture  2. Vaginal irritation  - Cervicovaginal ancillary only - Urine Culture     Plan:   Vaginal swab collected, see orders. Will contact patient with results.   Urine culture, see orders.   Discussed home treatment measures; handout provided.   Reviewed red flag symptoms and when to call.   RTC if symptoms worsen or fail to improve.    Diona Fanti, CNM [facility], [facility]

## 2019-08-05 NOTE — Patient Instructions (Addendum)
Vaginitis atrfica Atrophic Vaginitis La vaginitis atrfica es una afeccin en la que los tejidos que recubren la vagina se secan y [place]. Esta afeccin se produce en las mujeres que ya no tienen su perodo menstrual. Una cada en la produccin de una hormona femenina (estrgeno) causa esta afeccin. Esta hormona ayuda a:  Mantener la humedad de la vagina.  Producir un lquido transparente. Este lquido transparente [place] a: ? Hacer que la vagina est lista para Adult nurse. ? Proteger la vagina de las infecciones. Si el recubrimiento de la vagina se seca y afina, puede causar irritacin, sensacin de ardor o picazn. Tambin puede causar:  [place].  Dolor durante el examen vaginal.  Sangrado.  Falta de inters en el sexo.  Sensacin de ardor al [employer].  La presencia de un lquido marrn o [place] proveniente de la vagina. Algunas mujeres no tienen sntomas. Siga estas indicaciones en su casa: Medicamentos  Delphi de venta libre y los recetados solamente como se lo haya indicado el mdico.  No use ningn medicamento a base de hierbas u otro tipo de medicamento, excepto que el mdico lo apruebe.  Use medicamentos para la sequedad. Estos incluyen: ? Aceites para lubricar la vagina. ? Cremas. ? Cremas hidratantes. Indicaciones generales  No se haga duchas vaginales.  No use productos que puedan causarle sequedad vaginal. Estos incluyen: ? Aerosoles perfumados. ? Tampones perfumados. ? Jabones perfumados.  El sexo ayuda a aumentar el flujo sanguneo y a Museum/gallery conservator los tejidos de la vagina. Si siente dolor al Raynelle Jan sexo: ? Dgaselo a su pareja. ? Use productos para que el sexo sean ms placenteras. Use estos productos segn las indicaciones del mdico. Comunquese con un mdico si:  Presenta una secrecin proveniente de la vagina que es diferente de lo habitual.  Nota un olor ftido que proviene de la vagina.  Tiene nuevos  sntomas.  Los sntomas no mejoran.  [place]. Resumen  La vaginitis atrfica es una afeccin en la que el recubrimiento la vagina se seca y [place].  Esta afeccin afecta a las mujeres que ya no tienen su perodo menstrual.  El tratamiento puede incluir el uso de productos que ayudan a Chief Technology Officer vagina.  Comunquese con un mdico si no mejora con el tratamiento. Esta informacin no tiene Marine scientist el consejo del mdico. Asegrese de hacerle al mdico cualquier pregunta que tenga. Document Released: 01/10/2009 Document Revised: 11/27/2017 Document Reviewed: 11/27/2017 Elsevier Patient Education  2020 [place]. Vaginitis Vaginitis  La vaginitis es la irritacin e hinchazn (inflamacin) de la vagina. [place] bacterias y levaduras que se encuentran normalmente en la vagina crecen demasiado. Hay muchos tipos de Personnel officer. El tratamiento depende del tipo que usted tenga. Siga estas indicaciones en su casa: Estilo de vida  Mantenga el rea vaginal limpia y seca. ? Evite usar jabn. ? Enjuague la zona con agua.  No haga las siguientes cosas hasta que el mdico lo autorice: ? Personnel officer y limpiar dentro de la vagina (ducha vaginal). ? Usar tampones. ? Tener [employer].  Cuando vaya al bao, lmpiese de adelante hacia atrs.  Deje que la vagina respire. ? Use ropa interior de algodn. ? No use: ? Ropa interior mientras duerme. ? Pantalones ajustados. ? Ropa interior tipo tanga. ? Ropa interior o medias de nailon sin proteccin de algodn. ? [employer] ropa hmeda, como los trajes de bao, lo antes posible.  Use productos suaves y sin perfume. No use cosas que  puedan irritar la vagina, como los suavizantes para telas. Evite los siguientes productos si son perfumados: ? Aerosoles ntimos femeninos. ? Detergentes. ? Tampones. ? Productos de higiene femenina. ? Jabones o baos de espuma.  Practique sexo seguro y use preservativos. Instrucciones  generales  Delphi de venta libre y los recetados solamente como se lo haya indicado el mdico.  Si le recetaron un antibitico, tmelo o selo como se lo haya indicado el mdico. No deje de tomar ni de usar los antibiticos aunque comience a Sports administrator.  Concurra a todas las visitas de control como se lo haya indicado el mdico. Esto es importante. Comunquese con un mdico si:  Tiene dolor de vientre.  Tiene fiebre.  Sus sntomas duran ms de 2 o 3das. Solicite ayuda de inmediato si:  Tiene fiebre y los sntomas empeoran de [place] sbita. Resumen  La vaginitis es la irritacin e hinchazn de la vagina. Puede ocurrir cuando las bacterias y levaduras que se encuentran normalmente en la vagina crecen demasiado. Hay varios tipos.  El tratamiento depende del tipo que usted tenga.  No se haga duchas vaginales, no use tampones ni tenga relaciones sexuales hasta que el mdico la autorice. Cuando pueda volver a Clinical biochemist, practique sexo seguro y use condones. Esta informacin no tiene Marine scientist el consejo del mdico. Asegrese de hacerle al mdico cualquier pregunta que tenga. Document Released: 07/08/2012 Document Revised: 07/10/2017 Document Reviewed: 03/26/2012 Elsevier Patient Education  2020 [place] Dysuria La disuria es dolor o molestia al Garment/textile technologist. El dolor o la molestia se pueden sentir en la parte del cuerpo que transporta la orina fuera de la vejiga (uretra) o en el tejido que rodea los genitales. El dolor tambin se puede sentir en la zona de la ingle, en la parte inferior del abdomen y de la zona lumbar. Quizs tenga que orinar con frecuencia o la sensacin repentina de tener que orinar (tenesmo vesical). La disuria puede afectar tanto a hombres como a mujeres, pero es ms comn en las mujeres. La causa puede deberse a muchos problemas diferentes:  Infeccin de las vas urinarias.  Clculos renales o en la vejiga.   Ciertas enfermedades de transmisin sexual (ETS), como la clamidia.  Deshidratacin.  Inflamacin de los tejidos de la vagina.  Uso de ciertos medicamentos.  Uso de ciertos jabones o productos perfumados que provocan irritacin. Siga estas indicaciones en su casa: Instrucciones generales  Controle su afeccin para detectar cualquier cambio.  Orine con frecuencia. Evite retener la orina durante largos perodos.  Despus de defecar, las mujeres deben limpiarse desde adelante hacia atrs, usando el papel higinico solo [place].  Orine despus de [employer].  Concurra a todas las visitas de control como se lo haya indicado el mdico. Esto es importante.  Si le realizaron pruebas para Product manager causa de la disuria, es su responsabilidad retirar los resultados de [place]. Consulte al mdico o pregunte en el departamento donde se realiza la prueba cundo estarn [employer]. Comida y bebida   Beba suficiente lquido como para Theatre manager la orina de color amarillo plido.  Evite la cafena, el t y el alcohol. Estos productos pueden Scientist, research (medical) vejiga y Actuary disuria. En los hombres, el alcohol puede irritar la prstata. Medicamentos  Delphi de venta libre y los recetados solamente como se lo haya indicado el mdico.  Si le recetaron un antibitico, tmelo como se lo haya indicado el mdico. No deje  de tomar el antibitico aunque comience a sentirse mejor. Comunquese con un mdico si:  Tiene fiebre.  Siente dolor en la espalda o a los costados del cuerpo.  Tiene nuseas o vmitos.  Observa sangre en la orina.  Est orinando con ms frecuencia que lo habitual. Solicite ayuda de inmediato si:  El dolor es intenso y no se Engineer, production con los medicamentos.  No puede comer ni beber sin vomitar.  Se siente confundido.  Tiene una frecuencia cardaca acelerada en reposo.  Tiene temblores o escalofros.  Se siente muy dbil.  Resumen  La disuria es dolor o molestia al Garment/textile technologist. Existen muchas afecciones que pueden causar disuria.  Si tiene disuria, es posible que tenga que orinar con frecuencia o tenga la sensacin repentina de tener que orinar (tenesmo vesical).  Controle su afeccin para Actuary cambio. Concurra a todas las visitas de control como se lo haya indicado el mdico.  Asegrese de Garment/textile technologist con frecuencia y beber suficiente lquido para mantener la orina de color amar[place] plido. Esta informacin no tiene Marine scientist el consejo del mdico. Asegrese de hacerle al mdico cualquier pregunta que tenga. Document Released: 11/03/2007 Document Revised: 01/23/2018 Document Reviewed: 10/06/2017 Elsevier Patient Education  2020 [employer].

## 2019-08-07 LAB — URINE CULTURE: Organism ID, Bacteria: NO GROWTH

## 2019-08-10 ENCOUNTER — Other Ambulatory Visit: Payer: Self-pay

## 2019-08-10 ENCOUNTER — Ambulatory Visit: Payer: Federal, State, Local not specified - PPO | Admitting: Internal Medicine

## 2019-08-10 ENCOUNTER — Encounter: Payer: Self-pay | Admitting: Internal Medicine

## 2019-08-10 VITALS — BP 112/62 | HR 70 | Ht 66.0 in | Wt 168.2 lb

## 2019-08-10 DIAGNOSIS — G901 Familial dysautonomia [Riley-Day]: Secondary | ICD-10-CM | POA: Diagnosis not present

## 2019-08-10 MED ORDER — FUROSEMIDE 20 MG PO TABS: ORAL_TABLET | ORAL | 0 refills | Status: DC

## 2019-08-10 NOTE — Patient Instructions (Addendum)
Medication Instructions:  Your physician has recommended you make the following change in your medication:   1.  Start lasix (furosemide) 20 mg-- Take one tablet by mouth every other day for 10 days  Labwork: None ordered.  Testing/Procedures: None ordered.  Follow-Up: Your physician wants you to follow-up in: 3 months with [name] in [place].     Any Other Special Instructions Will Be Listed Below (If Applicable).  If you need a refill on your cardiac medications before your next appointment, please call your pharmacy.

## 2019-08-10 NOTE — Progress Notes (Signed)
Patient Care Team: Patient, No Pcp Per as PCP - General (General Practice)   HPI  Lori [name] is a 53 y.o. female Seen in followup for palpitations in the context of what I thought on evaluation December 2013 was dysautonomia. She had accompanying lightheadedness residual fatigue and chest discomfort.prior evaluation had also suggested that she had a "hole in the heart". Recent echocardiogram did not support that diagnosis. Event recorder previously had demonstrated PVCs.    She has begun her m enopause and is now without bleding   The summer was much easier, she had been able to exercise and had lost 25 lbs more recently, she has gained weight again.   she started having recurrent syncope albeit with about a 10 min prodrome of flushing nausea and tachypalpitations  Significant edema and dyspnea  Saw RU last month--echo Normal LV function     Past Medical History:  Diagnosis Date  . Asthma   . Cancer, uterine ([place]) 1998  . Chest pain    normal LV function by echo 10/2010  . High cholesterol   . 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 Kidney stone   . Palpitations     Past Surgical History:  Procedure Laterality Date  . APPENDECTOMY    . CESAREAN SECTION     x2  . CHOLECYSTECTOMY    . neurocardiogenic syncope    . PATENT FORAMEN OVALE CLOSURE    . SURGERY ON FACE     AFTER FALL  . WRIST SURGERY     broken wrist,left    Current Outpatient Medications  Medication Sig Dispense Refill  . albuterol (PROVENTIL HFA;VENTOLIN HFA) 108 (90 Base) MCG/ACT inhaler TOME DOS INHALACIONES CADA SEIS HORAS CUANDO SEA NECESARIO FOR WHEEZING/ SHORTNESS OF BREATH 6.7 Inhaler 0  . BIOTIN PO Take 1 tablet by mouth daily.    . Collagen Hydrolysate, Bovine, POWD by Does not apply route.    . famotidine (PEPCID) 20 MG tablet Take 1 tablet (20 mg total) by mouth 2 (two) times daily. 60 tablet 0  . metroNIDAZOLE (METROCREAM) 0.75 % cream Apply 1 application topically as needed.    . Multiple  Vitamin (MULTIVITAMIN) tablet Take 1 tablet by mouth daily.    . simvastatin (ZOCOR) 10 MG tablet TOME UNA TABLETA TODOS LOS D AS     No current facility-administered medications for this visit.     Allergies  Allergen Reactions  . Atorvastatin Swelling  . Ciprofloxacin Hcl Other (See Comments)    Heart condition  Lowers blood pressure  . Cyclobenzaprine Other (See Comments)    Angina; lowers BP  . Latex   . Levaquin [Levofloxacin In D5w]     Tremors, nausea  . Nortriptyline Other (See Comments)    Angina, lowers BP  . Pravastatin Other (See Comments)    gingivitis  . Prednisone Other (See Comments)    Has heart condition causes BP to gt too low.  Francine Graven Hives    Review of Systems negative except from HPI and PMH  Physical Exam BP 112/62   Pulse 70   Ht 5\' 6"  (1.676 m)   Wt 168 lb 3.2 oz (76.3 kg)   LMP 11/19/2015   SpO2 98%   BMI 27.15 kg/m   Well developed and nourished in no acute distress HENT normal Neck supple with JVP-  Clear Regular rate and rhythm, no murmurs or gallops Abd-soft with active BS No Clubbing cyanosis 2+ edema Skin-warm and dry A & Oriented  Grossly normal sensory and motor function  ECG   ECG 9/20 Personally reviewed  normal  Assessment and  Plan  Dysautonomia  Palpitations  anxiety  HFpeF   Echo normal  Volume overloaded, I suspect from salt and water intake-- will have her refrain  I am somewhat worried about the associated palpitations but hard to know what to do until she is euvolemic  Discussed short term diuretics   She would prefer so will do lasix 20 mmg qod x 5 doses  Will see in 3 months   We spent more than 50% of our >25 min visit in face to face counseling regarding the above

## 2019-08-16 LAB — CERVICOVAGINAL ANCILLARY ONLY
Bacterial Vaginitis (gardnerella): NEGATIVE
Candida Glabrata: NEGATIVE
Candida Vaginitis: NEGATIVE
Comment: NEGATIVE
Comment: NEGATIVE
Comment: NEGATIVE
Comment: NEGATIVE
Trichomonas: NEGATIVE

## 2019-08-20 ENCOUNTER — Other Ambulatory Visit: Payer: Self-pay | Admitting: Internal Medicine

## 2019-08-20 NOTE — Telephone Encounter (Signed)
LMTCB

## 2019-08-20 NOTE — Telephone Encounter (Signed)
Pt calling stating that [name] prescribed her furosemide 20 mg taking every other day dispensing 5 tablets. Pt states that this helped, but her legs are still swollen and would like to know if [name] could send in more medication. Please address

## 2019-08-27 ENCOUNTER — Ambulatory Visit
Admission: RE | Admit: 2019-08-27 | Discharge: 2019-08-27 | Disposition: A | Discharge status: Home / Self Care | Payer: Federal, State, Local not specified - PPO | Source: Ambulatory Visit | Attending: Internal Medicine | Admitting: Internal Medicine

## 2019-08-27 DIAGNOSIS — Z1231 Encounter for screening mammogram for malignant neoplasm of breast: Secondary | ICD-10-CM | POA: Insufficient documentation

## 2019-08-30 NOTE — Telephone Encounter (Signed)
We can Rx at 20 mg daily  But she needs to be aware that when her swelling is gone, or even before, her dizziness may worsen with intravascular depletion from her diuretics  Thanks SK

## 2019-08-30 NOTE — Telephone Encounter (Signed)
Pt is requesting a refill on Furosemide, pt wants to take this medication every day, because pt states that taking this medication helps the swelling go down and keeps it down. Please address

## 2019-08-31 MED ORDER — FUROSEMIDE 20 MG PO TABS: 20.0000 mg | ORAL_TABLET | Freq: Every day | ORAL | 5 refills | Status: DC | PRN

## 2019-08-31 NOTE — Telephone Encounter (Signed)
Called pt to informed her per [name] that he is sending in a new script for furosemide 20 mg tablet, pt taking 1 tablet daily as needed for swelling and that per [name] that when the swelling is gone, or before, that pt's dizziness may worsen with intravascular depletion from pt's diuretics. I advised pt that if she has any other problems, questions or concerns to call the office. Pt verbalized understanding.

## 2019-09-27 ENCOUNTER — Other Ambulatory Visit: Payer: Self-pay

## 2019-09-27 DIAGNOSIS — Z20822 Contact with and (suspected) exposure to covid-19: Secondary | ICD-10-CM

## 2019-09-29 LAB — NOVEL CORONAVIRUS, NAA: SARS-CoV-2, NAA: NOT DETECTED

## 2019-11-09 ENCOUNTER — Other Ambulatory Visit: Payer: Self-pay

## 2019-11-09 ENCOUNTER — Ambulatory Visit: Payer: Federal, State, Local not specified - PPO | Admitting: Internal Medicine

## 2019-11-09 ENCOUNTER — Encounter: Payer: Self-pay | Admitting: Internal Medicine

## 2019-11-09 VITALS — BP 121/66 | HR 64 | Ht 66.0 in | Wt 172.0 lb

## 2019-11-09 DIAGNOSIS — G901 Familial dysautonomia [Riley-Day]: Secondary | ICD-10-CM | POA: Diagnosis not present

## 2019-11-09 DIAGNOSIS — R002 Palpitations: Secondary | ICD-10-CM

## 2019-11-09 NOTE — Patient Instructions (Signed)
Medication Instructions:  - Your physician recommends that you continue on your current medications as directed. Please refer to the Current Medication list given to you today.  *If you need a refill on your cardiac medications before your next appointment, please call your pharmacy*  Lab Work: - none ordered  If you have labs (blood work) drawn today and your tests are completely normal, you will receive your results only by: . MyChart Message (if you have MyChart) OR . A paper copy in the mail If you have any lab test that is abnormal or we need to change your treatment, we will call you to review the results.  Testing/Procedures: - none ordered  Follow-Up: At [facility], you and your health needs are our priority.  As part of our continuing mission to provide you with exceptional heart care, we have created designated Provider Care Teams.  These Care Teams include your primary Cardiologist (physician) and Advanced Practice Providers (APPs -  Physician Assistants and Nurse Practitioners) who all work together to provide you with the care you need, when you need it.  Your next appointment:   6 months  The format for your next appointment:   In Person  Provider:   Steven Klein, MD  Other Instructions - n/a  

## 2019-11-09 NOTE — Progress Notes (Signed)
Patient Care Team: Patient, No Pcp Per as PCP - General (General Practice)   HPI  Samaree Kinner is a 54 y.o. female Seen in followup for palpitations in the context of what I thought on evaluation December 2013 was dysautonomia. She had accompanying lightheadedness residual fatigue and chest discomfort.prior evaluation had also suggested that she had a "hole in the heart". Recent echocardiogram did not support that diagnosis. Event recorder previously had demonstrated PVCs.    DATE TEST EF   8/13 Echo   60-65 % Mild BAE  9/20 Echo   60-65 %         Event Recorder personnally reviewed  Symptoms assoc with sinus and ventricular trigeminy Also asymptomatic SVT nonsustained ( max 12 secs)  She has begun her m enopause and is now without bleding   The summer was much easier, she had been able to exercise and had lost 25 lbs more recently, she has gained weight again.   Marlana Salvage is doing much better with scant lightheadedness and dizziness.  She is exercising.  No post exercise dizziness.  Minimal orthostasis  Some palpitations event recorder as above   Past Medical History:  Diagnosis Date  . Asthma   . Cancer, uterine ([place]) 1998  . Chest pain    normal LV function by echo 10/2010  . High cholesterol   . 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 Kidney stone   . Palpitations     Past Surgical History:  Procedure Laterality Date  . APPENDECTOMY    . CESAREAN SECTION     x2  . CHOLECYSTECTOMY    . neurocardiogenic syncope    . PATENT FORAMEN OVALE CLOSURE    . SURGERY ON FACE     AFTER FALL  . WRIST SURGERY     broken wrist,left    Current Outpatient Medications  Medication Sig Dispense Refill  . albuterol (PROVENTIL HFA;VENTOLIN HFA) 108 (90 Base) MCG/ACT inhaler TOME DOS INHALACIONES CADA SEIS HORAS CUANDO SEA NECESARIO FOR WHEEZING/ SHORTNESS OF BREATH 6.7 Inhaler 0  . BIOTIN PO Take 1 tablet by mouth daily.    . Collagen Hydrolysate, Bovine, POWD by Does not apply route.     . famotidine (PEPCID) 20 MG tablet Take 1 tablet (20 mg total) by mouth 2 (two) times daily. 60 tablet 0  . furosemide (LASIX) 20 MG tablet Take 1 tablet (20 mg total) by mouth daily as needed. Medication increased. Please refill. Thank you 30 tablet 5  . metroNIDAZOLE (METROCREAM) 0.75 % cream Apply 1 application topically as needed.    . Multiple Vitamin (MULTIVITAMIN) tablet Take 1 tablet by mouth daily.    . simvastatin (ZOCOR) 10 MG tablet TOME UNA TABLETA TODOS LOS D AS     No current facility-administered medications for this visit.    Allergies  Allergen Reactions  . Atorvastatin Swelling  . Ciprofloxacin Hcl Other (See Comments)    Heart condition  Lowers blood pressure  . Cyclobenzaprine Other (See Comments)    Angina; lowers BP  . Latex   . Levaquin [Levofloxacin In D5w]     Tremors, nausea  . Nortriptyline Other (See Comments)    Angina, lowers BP  . Pravastatin Other (See Comments)    gingivitis  . Prednisone Other (See Comments)    Has heart condition causes BP to gt too low.  Francine Graven Hives    Review of Systems negative except from HPI and PMH  Physical Exam BP 121/66 (BP Location:  Left Arm, Patient Position: Sitting, Cuff Size: Normal)   Pulse 64   Ht 5\' 6"  (1.676 m)   Wt 172 lb (78 kg)   LMP 11/19/2015   SpO2 98%   BMI 27.76 kg/m  Well developed and nourished in no acute distress HENT normal Neck supple with JVP-  flat   Clear Regular rate and rhythm, no murmurs or gallops Abd-soft with active BS No Clubbing cyanosis edema Skin-warm and dry A & Oriented  Grossly normal sensory and motor function  ECG sinus at 64 Interval 13/07/38 Otherwise normal   Assessment and  Plan  Dysautonomia  Palpitations  anxiety  HFpeF   Euvolemic.  Overall doing as well as I have seen her in some time.  No changes.

## 2020-01-07 ENCOUNTER — Telehealth: Payer: Self-pay | Admitting: Internal Medicine

## 2020-01-07 NOTE — Telephone Encounter (Signed)
Spoke with Ishmael Holter who states he is at the store and pt is sitting in the car.  States he will have pt call back to [phone].

## 2020-01-07 NOTE — Telephone Encounter (Signed)
Patient states she has been experiencing a burning sensation around her heart. She states it has been coming and going for 4 days and she has also been feeling nauseous and experiencing some sweating. Please advise.

## 2020-01-07 NOTE — Progress Notes (Signed)
PCP:  Lori [name], No Pcp Per Electrophysiologist: Dr. Cannon Kettle is a 54 y.o. female with past medical history of palpitations, HFpEF, and chest discomfort who presents today for add on due to chest discomfort. They are seen for [name].   Since last being seen in our clinic, the Lori [name] reports having issues with palpitations and a strange sensation around her heart.  She feels like this has been worse since getting her initial COVID vaccine last month, but is not sure if it could also be related to her allergies.   She has mild lightheadedness at baseline.  Watching her volume sine she had volume overload several visits ago.  Orthostatics today stable.  See below  Echo 05/2012 LVEF 60-65% Mild BAE Echo 06/2019 LVEF 60-65%   Past Medical History:  Diagnosis Date  . Asthma   . Cancer, uterine ([place]) 1998  . Chest pain    normal LV function by echo 10/2010  . High cholesterol   . 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 Kidney stone   . Palpitations    Past Surgical History:  Procedure Laterality Date  . APPENDECTOMY    . CESAREAN SECTION     x2  . CHOLECYSTECTOMY    . neurocardiogenic syncope    . PATENT FORAMEN OVALE CLOSURE    . SURGERY ON FACE     AFTER FALL  . WRIST SURGERY     broken wrist,left    Current Outpatient Medications  Medication Sig Dispense Refill  . albuterol (PROVENTIL HFA;VENTOLIN HFA) 108 (90 Base) MCG/ACT inhaler TOME DOS INHALACIONES CADA SEIS HORAS CUANDO SEA NECESARIO FOR WHEEZING/ SHORTNESS OF BREATH 6.7 Inhaler 0  . BIOTIN PO Take 1 tablet by mouth daily.    . Collagen Hydrolysate, Bovine, POWD by Does not apply route.    . furosemide (LASIX) 20 MG tablet Take 1 tablet (20 mg total) by mouth daily as needed. Medication increased. Please refill. Thank you 30 tablet 5  . Multiple Vitamin (MULTIVITAMIN) tablet Take 1 tablet by mouth daily.    . simvastatin (ZOCOR) 10 MG tablet TOME UNA TABLETA TODOS LOS D AS    . ezetimibe (ZETIA) 10 MG tablet Take  1 tablet (10 mg total) by mouth daily. 90 tablet 3   No current facility-administered medications for this visit.    Allergies  Allergen Reactions  . Atorvastatin Swelling  . Ciprofloxacin Hcl Other (See Comments)    Heart condition  Lowers blood pressure  . Cyclobenzaprine Other (See Comments)    Angina; lowers BP  . Latex   . Levaquin [Levofloxacin In D5w]     Tremors, nausea  . Nortriptyline Other (See Comments)    Angina, lowers BP  . Pravastatin Other (See Comments)    gingivitis  . Prednisone Other (See Comments)    Has heart condition causes BP to gt too low.  Francine Graven Hives    Social History   Socioeconomic History  . Marital status: Married    Spouse name: Not on file  . Number of children: Not on file  . Years of education: Not on file  . Highest education level: Not on file  Occupational History  . Not on file  Tobacco Use  . Smoking status: Former Smoker    Packs/day: 1.00    Years: 5.00    Pack years: 5.00    Types: Cigarettes    Quit date: 02/11/2006    Years since quitting: 13.9  . Smokeless tobacco: Never  Used  Substance and Sexual Activity  . Alcohol use: No    Alcohol/week: 0.0 standard drinks  . Drug use: No  . Sexual activity: Not Currently    Partners: Male    Birth control/protection: Post-menopausal  Other Topics Concern  . Not on file  Social History Narrative  . Not on file   Social Determinants of Health   Financial Resource Strain:   . Difficulty of Paying Living Expenses:   Food Insecurity:   . Worried About Charity fundraiser in the Last Year:   . Arboriculturist in the Last Year:   Transportation Needs:   . Film/video editor (Medical):   Marland Kitchen Lack of Transportation (Non-Medical):   Physical Activity:   . Days of Exercise per Week:   . Minutes of Exercise per Session:   Stress:   . Feeling of Stress :   Social Connections:   . Frequency of Communication with Friends and Family:   . Frequency of Social  Gatherings with Friends and Family:   . Attends Religious Services:   . Active Member of Clubs or Organizations:   . Attends Archivist Meetings:   Marland Kitchen Marital Status:   Intimate Partner Violence:   . Fear of Current or Ex-Partner:   . Emotionally Abused:   Marland Kitchen Physically Abused:   . Sexually Abused:      Review of Systems: General: No chills, fever, night sweats or weight changes  Cardiovascular:  No chest pain, dyspnea on exertion, edema, orthopnea, palpitations, paroxysmal nocturnal dyspnea Dermatological: No rash, lesions or masses Respiratory: No cough, dyspnea Urologic: No hematuria, dysuria Abdominal: No nausea, vomiting, diarrhea, bright red blood per rectum, melena, or hematemesis Neurologic: No visual changes, weakness, changes in mental status All other systems reviewed and are otherwise negative except as noted above.  Physical Exam: Vitals:   01/10/20 1114  SpO2: 98%  Weight: 164 lb 12.8 oz (74.8 kg)  Height: 5\' 6"  (1.676 m)   Orthostatic VS for the past 24 hrs:  BP- Lying Pulse- Lying BP- Sitting Pulse- Sitting BP- Standing at 0 minutes Pulse- Standing at 0 minutes  01/10/20 1208 122/76 55 127/72 58 115/74 68      GEN- The Lori [name] is well appearing, alert and oriented x 3 today.   HEENT: normocephalic, atraumatic; sclera clear, conjunctiva pink; hearing intact; oropharynx clear; neck supple, no JVP Lymph- no cervical lymphadenopathy Lungs- Clear to ausculation bilaterally, normal work of breathing.  No wheezes, rales, rhonchi Heart- Regular rate and rhythm, no murmurs, rubs or gallops, PMI not laterally displaced GI- soft, non-tender, non-distended, bowel sounds present, no hepatosplenomegaly Extremities- no clubbing, cyanosis, or edema; DP/PT/radial pulses 2+ bilaterally MS- no significant deformity or atrophy Skin- warm and dry, no rash or lesion Psych- euthymic mood, full affect Neuro- strength and sensation are intact  EKG is ordered. Personal  review of EKG from today shows sinus bradycardia at 53 bpm with normal intervals  Assessment and Plan:  1. Dysautonomia 2. Palpitations 3. Anxiety 4. Chest discomfort  Overall she is doing well barring her recent issues. Reassurance given today. [name] in to see.   Recommendation that she try Zetia for HLD (previously intolerant to Atorvastatin with swelling)  Recommendation that she can use anything she wants for Allergies, as long as no -D on the end. Should avoid decongestants in general.   RTC 3 months with Dr. Arther Dames, PA-C  01/10/20 2:12 PM

## 2020-01-10 ENCOUNTER — Ambulatory Visit: Payer: Federal, State, Local not specified - PPO | Admitting: Student

## 2020-01-10 ENCOUNTER — Other Ambulatory Visit: Payer: Self-pay

## 2020-01-10 ENCOUNTER — Encounter: Payer: Self-pay | Admitting: Student

## 2020-01-10 VITALS — Ht 66.0 in | Wt 164.8 lb

## 2020-01-10 DIAGNOSIS — E785 Hyperlipidemia, unspecified: Secondary | ICD-10-CM | POA: Diagnosis not present

## 2020-01-10 DIAGNOSIS — R079 Chest pain, unspecified: Secondary | ICD-10-CM | POA: Diagnosis not present

## 2020-01-10 DIAGNOSIS — G901 Familial dysautonomia [Riley-Day]: Secondary | ICD-10-CM | POA: Diagnosis not present

## 2020-01-10 DIAGNOSIS — R55 Syncope and collapse: Secondary | ICD-10-CM

## 2020-01-10 DIAGNOSIS — R002 Palpitations: Secondary | ICD-10-CM | POA: Diagnosis not present

## 2020-01-10 MED ORDER — EZETIMIBE 10 MG PO TABS: 10.0000 mg | ORAL_TABLET | Freq: Every day | ORAL | 3 refills | Status: DC

## 2020-01-10 NOTE — Patient Instructions (Addendum)
Medication Instructions:  START ZETIA 10 mg Daily *If you need a refill on your cardiac medications before your next appointment, please call your pharmacy*   Lab Work: none If you have labs (blood work) drawn today and your tests are completely normal, you will receive your results only by: Marland Kitchen MyChart Message (if you have MyChart) OR . A paper copy in the mail If you have any lab test that is abnormal or we need to change your treatment, we will call you to review the results.   Testing/Procedures: none   Follow-Up: At [facility], you and your health needs are our priority.  As part of our continuing mission to provide you with exceptional heart care, we have created designated Provider Care Teams.  These Care Teams include your primary Cardiologist (physician) and Advanced Practice Providers (APPs -  Physician Assistants and Nurse Practitioners) who all work together to provide you with the care you need, when you need it.  We recommend signing up for the patient portal called "MyChart".  Sign up information is provided on this After Visit Summary.  MyChart is used to connect with patients for Virtual Visits (Telemedicine).  Patients are able to view lab/test results, encounter notes, upcoming appointments, etc.  Non-urgent messages can be sent to your provider as well.   To learn more about what you can do with MyChart, go to [email or web address].    Your next appointment:   3 month(s)  The format for your next appointment:   Either In Person or Virtual  Provider:   Dr Caryl Comes   Other Instructions Take anything for your allergies except medication with DECONGESTANT  Ezetimibe Tablets What is this medicine? EZETIMIBE (ez ET i mibe) blocks the absorption of cholesterol from the stomach. It can help lower blood cholesterol for patients who are at risk of getting heart disease or a stroke. It is only for patients whose cholesterol level is not controlled by diet. This  medicine may be used for other purposes; ask your health care provider or pharmacist if you have questions. COMMON BRAND NAME(S): Zetia What should I tell my health care provider before I take this medicine? They need to know if you have any of these conditions:  liver disease  an unusual or allergic reaction to ezetimibe, medicines, foods, dyes, or preservatives  pregnant or trying to get pregnant  breast-feeding How should I use this medicine? Take this medicine by mouth with a glass of water. Follow the directions on the prescription label. This medicine can be taken with or without food. Take your doses at regular intervals. Do not take your medicine more often than directed. Talk to your pediatrician regarding the use of this medicine in children. Special care may be needed. Overdosage: If you think you have taken too much of this medicine contact a poison control center or emergency room at once. NOTE: This medicine is only for you. Do not share this medicine with others. What if I miss a dose? If you miss a dose, take it as soon as you can. If it is almost time for your next dose, take only that dose. Do not take double or extra doses. What may interact with this medicine? Do not take this medicine with any of the following medications:  fenofibrate  gemfibrozil This medicine may also interact with the following medications:  antacids  cyclosporine  herbal medicines like red yeast rice  other medicines to lower cholesterol or triglycerides This list may not describe all  possible interactions. Give your health care provider a list of all the medicines, herbs, non-prescription drugs, or dietary supplements you use. Also tell them if you smoke, drink alcohol, or use illegal drugs. Some items may interact with your medicine. What should I watch for while using this medicine? Visit your doctor or health care professional for regular checks on your progress. You will need to have  your cholesterol levels checked. If you are also taking some other cholesterol medicines, you will also need to have tests to make sure your liver is working properly. Tell your doctor or health care professional if you get any unexplained muscle pain, tenderness, or weakness, especially if you also have a fever and tiredness. You need to follow a low-cholesterol, low-fat diet while you are taking this medicine. This will decrease your risk of getting heart and blood vessel disease. Exercising and avoiding alcohol and smoking can also help. Ask your doctor or dietician for advice. What side effects may I notice from receiving this medicine? Side effects that you should report to your doctor or health care professional as soon as possible:  allergic reactions like skin rash, itching or hives, swelling of the face, lips, or tongue  dark yellow or brown urine  unusually weak or tired  yellowing of the skin or eyes Side effects that usually do not require medical attention (report to your doctor or health care professional if they continue or are bothersome):  diarrhea  dizziness  headache  stomach upset or pain This list may not describe all possible side effects. Call your doctor for medical advice about side effects. You may report side effects to FDA at 1-800-FDA-1088. Where should I keep my medicine? Keep out of the reach of children. Store at room temperature between 15 and 30 degrees C (59 and 86 degrees F). Protect from moisture. Keep container tightly closed. Throw away any unused medicine after the expiration date. NOTE: This sheet is a summary. It may not cover all possible information. If you have questions about this medicine, talk to your doctor, pharmacist, or health care provider.  2020 Elsevier/Gold Standard (2012-04-20 15:39:09)

## 2020-01-30 ENCOUNTER — Emergency Department: Payer: Federal, State, Local not specified - PPO

## 2020-01-30 ENCOUNTER — Other Ambulatory Visit: Payer: Self-pay

## 2020-01-30 ENCOUNTER — Emergency Department
Admission: EM | Admit: 2020-01-30 | Discharge: 2020-01-30 | Disposition: A | Discharge status: Home / Self Care | Payer: Federal, State, Local not specified - PPO | Attending: Emergency Medicine | Admitting: Emergency Medicine

## 2020-01-30 ENCOUNTER — Encounter: Payer: Self-pay | Admitting: Emergency Medicine

## 2020-01-30 DIAGNOSIS — Z91040 Latex allergy status: Secondary | ICD-10-CM | POA: Insufficient documentation

## 2020-01-30 DIAGNOSIS — Z87891 Personal history of nicotine dependence: Secondary | ICD-10-CM | POA: Diagnosis not present

## 2020-01-30 DIAGNOSIS — J45909 Unspecified asthma, uncomplicated: Secondary | ICD-10-CM | POA: Insufficient documentation

## 2020-01-30 DIAGNOSIS — Z9049 Acquired absence of other specified parts of digestive tract: Secondary | ICD-10-CM | POA: Diagnosis not present

## 2020-01-30 DIAGNOSIS — N200 Calculus of kidney: Secondary | ICD-10-CM | POA: Insufficient documentation

## 2020-01-30 DIAGNOSIS — Z79899 Other long term (current) drug therapy: Secondary | ICD-10-CM | POA: Insufficient documentation

## 2020-01-30 DIAGNOSIS — Z8542 Personal history of malignant neoplasm of other parts of uterus: Secondary | ICD-10-CM | POA: Diagnosis not present

## 2020-01-30 DIAGNOSIS — R1031 Right lower quadrant pain: Secondary | ICD-10-CM | POA: Diagnosis present

## 2020-01-30 LAB — URINALYSIS, COMPLETE (UACMP) WITH MICROSCOPIC
Bacteria, UA: NONE SEEN
Bilirubin Urine: NEGATIVE
Glucose, UA: NEGATIVE mg/dL
Hgb urine dipstick: NEGATIVE
Ketones, ur: NEGATIVE mg/dL
Leukocytes,Ua: NEGATIVE
Nitrite: NEGATIVE
Protein, ur: NEGATIVE mg/dL
Specific Gravity, Urine: 1.016 (ref 1.005–1.030)
pH: 6 (ref 5.0–8.0)

## 2020-01-30 LAB — COMPREHENSIVE METABOLIC PANEL WITH GFR
ALT: 24 U/L (ref 0–44)
AST: 22 U/L (ref 15–41)
Albumin: 4.2 g/dL (ref 3.5–5.0)
Alkaline Phosphatase: 80 U/L (ref 38–126)
Anion gap: 9 (ref 5–15)
BUN: 15 mg/dL (ref 6–20)
CO2: 26 mmol/L (ref 22–32)
Calcium: 8.7 mg/dL — ABNORMAL LOW (ref 8.9–10.3)
Chloride: 103 mmol/L (ref 98–111)
Creatinine, Ser: 0.71 mg/dL (ref 0.44–1.00)
GFR calc Af Amer: >60 mL/min
GFR calc non Af Amer: >60 mL/min
Glucose, Bld: 127 mg/dL — ABNORMAL HIGH (ref 70–99)
Potassium: 3.9 mmol/L (ref 3.5–5.1)
Sodium: 138 mmol/L (ref 135–145)
Total Bilirubin: 0.6 mg/dL (ref 0.3–1.2)
Total Protein: 7.4 g/dL (ref 6.5–8.1)

## 2020-01-30 LAB — CBC WITH DIFFERENTIAL/PLATELET
Abs Immature Granulocytes: 0.02 10*3/uL (ref 0.00–0.07)
Basophils Absolute: 0 10*3/uL (ref 0.0–0.1)
Basophils Relative: 0 %
Eosinophils Absolute: 0.1 10*3/uL (ref 0.0–0.5)
Eosinophils Relative: 1 %
HCT: 44 % (ref 36.0–46.0)
Hemoglobin: 14.1 g/dL (ref 12.0–15.0)
Immature Granulocytes: 0 %
Lymphocytes Relative: 41 %
Lymphs Abs: 3.7 10*3/uL (ref 0.7–4.0)
MCH: 28.1 pg (ref 26.0–34.0)
MCHC: 32 g/dL (ref 30.0–36.0)
MCV: 87.8 fL (ref 80.0–100.0)
Monocytes Absolute: 0.4 10*3/uL (ref 0.1–1.0)
Monocytes Relative: 5 %
Neutro Abs: 4.7 10*3/uL (ref 1.7–7.7)
Neutrophils Relative %: 53 %
Platelets: 256 10*3/uL (ref 150–400)
RBC: 5.01 MIL/uL (ref 3.87–5.11)
RDW: 12.7 % (ref 11.5–15.5)
WBC: 9 10*3/uL (ref 4.0–10.5)
nRBC: 0 % (ref 0.0–0.2)

## 2020-01-30 LAB — POCT PREGNANCY, URINE: Preg Test, Ur: NEGATIVE

## 2020-01-30 LAB — LIPASE, BLOOD: Lipase: 31 U/L (ref 11–51)

## 2020-01-30 MED ORDER — SODIUM CHLORIDE 0.9 % IV BOLUS
1000.0000 mL | Freq: Once | INTRAVENOUS | Status: AC
  Administered 2020-01-30: 1000 mL via INTRAVENOUS

## 2020-01-30 MED ORDER — KETOROLAC TROMETHAMINE 30 MG/ML IJ SOLN
15.0000 mg | Freq: Once | INTRAMUSCULAR | Status: AC
  Administered 2020-01-30: 15 mg via INTRAVENOUS
  Filled 2020-01-30: qty 1

## 2020-01-30 MED ORDER — HYDROMORPHONE HCL 1 MG/ML IJ SOLN
0.5000 mg | Freq: Once | INTRAMUSCULAR | Status: AC
  Administered 2020-01-30: 0.5 mg via INTRAVENOUS
  Filled 2020-01-30: qty 1

## 2020-01-30 MED ORDER — ONDANSETRON HCL 4 MG/2ML IJ SOLN
4.0000 mg | Freq: Once | INTRAMUSCULAR | Status: AC
  Administered 2020-01-30: 11:00:00 4 mg via INTRAVENOUS
  Filled 2020-01-30: qty 2

## 2020-01-30 MED ORDER — OXYCODONE HCL 5 MG PO TABS
5.0000 mg | ORAL_TABLET | Freq: Once | ORAL | Status: AC
  Administered 2020-01-30: 5 mg via ORAL
  Filled 2020-01-30: qty 1

## 2020-01-30 MED ORDER — IBUPROFEN 600 MG PO TABS: 600.0000 mg | ORAL_TABLET | Freq: Four times a day (QID) | ORAL | 0 refills | Status: AC | PRN

## 2020-01-30 MED ORDER — ONDANSETRON 4 MG PO TBDP: 4.0000 mg | ORAL_TABLET | Freq: Three times a day (TID) | ORAL | 0 refills | Status: DC | PRN

## 2020-01-30 MED ORDER — OXYCODONE HCL 5 MG PO TABS: 5.0000 mg | ORAL_TABLET | Freq: Four times a day (QID) | ORAL | 0 refills | Status: AC | PRN

## 2020-01-30 NOTE — ED Provider Notes (Signed)
[facility] Emergency Department Provider Note  ____________________________________________   First MD Initiated Contact with Patient 01/30/20 1023     (approximate)  I have reviewed the triage vital signs and the nursing notes.   HISTORY  Chief Complaint Flank Pain    HPI Lori [name] is a 54 y.o. female with prior kidney stone, prior appendectomy and gallbladder removal who comes in with right flank pain.  Patient's pain started this morning, severe, constant, radiating into the groin, nothing makes it better, nothing makes it worse.  States that she has had kidney stones before but this is much worse than previous.  Has had a little bit of nausea and some urinary symptoms associated with it.  No fever      Family declined a interpreter.  Husband prefer to interpret    Past Medical History:  Diagnosis Date  . Asthma   . Cancer, uterine ([place]) 1998  . Chest pain    normal LV function by echo 10/2010  . High cholesterol   . 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 Kidney stone   . Palpitations     Patient Active Problem List   Diagnosis Date Noted  . Left Achilles tendinitis 10/26/2017  . Dental infection 04/06/2016  . Neck pain 04/06/2016  . BPPV (benign paroxysmal positional vertigo) 02/22/2016  . Perennial allergic rhinitis 02/22/2016  . Metabolic syndrome 0000000  . Eustachian tube dysfunction 09/15/2015  . Asthma, mild intermittent, poorly controlled 09/15/2015  . Cervical spondylosis 08/29/2015  . Overweight (BMI 25.0-29.9) 06/29/2015  . IBS (irritable bowel syndrome) 06/29/2015  . 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 UPJ (ureteropelvic junction) obstruction 06/22/2015  . Cervical radiculitis 07/19/2014  . 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 Dysautonomia ([place]) 10/01/2012  . History of syncope 07/07/2012  . Depression with anxiety 06/11/2011  . Dyslipidemia 11/02/2010    Past Surgical  History:  Procedure Laterality Date  . APPENDECTOMY    . CESAREAN SECTION     x2  . CHOLECYSTECTOMY    . neurocardiogenic syncope    . PATENT FORAMEN OVALE CLOSURE    . SURGERY ON FACE     AFTER FALL  . WRIST SURGERY     broken wrist,left    Prior to Admission medications   Medication Sig Start Date End Date Taking? Authorizing Provider  albuterol (PROVENTIL HFA;VENTOLIN HFA) 108 (90 Base) MCG/ACT inhaler TOME DOS INHALACIONES CADA SEIS HORAS CUANDO SEA NECESARIO FOR WHEEZING/ SHORTNESS OF BREATH 01/27/19   Sowles, Drue Stager, MD  BIOTIN PO Take 1 tablet by mouth daily.    [provider]  Collagen Hydrolysate, Bovine, POWD by Does not apply route.    [provider]  ezetimibe (ZETIA) 10 MG tablet Take 1 tablet (10 mg total) by mouth daily. 01/10/20 04/09/20  Shirley Friar, PA-C  furosemide (LASIX) 20 MG tablet Take 1 tablet (20 mg total) by mouth daily as needed. Medication increased. Please refill. Thank you 08/31/19   Deboraha Sprang, MD  Multiple Vitamin (MULTIVITAMIN) tablet Take 1 tablet by mouth daily.    [provider]  simvastatin (ZOCOR) 10 MG tablet TOME UNA TABLETA TODOS LOS D AS 02/06/19   [provider]    Allergies Atorvastatin, Ciprofloxacin hcl, Cyclobenzaprine, Latex, Levaquin [levofloxacin in d5w], Nortriptyline, Pravastatin, Prednisone, and Sulfamethizole  Family History  Problem Relation Age of Onset  . Hypertension Mother   . Heart disease Father 36       MI  .  Cancer Maternal Aunt   . Uterine cancer Maternal Aunt   . Breast cancer Neg Hx   . Ovarian cancer Neg Hx   . Colon cancer Neg Hx     Social History Social History   Tobacco Use  . Smoking status: Former Smoker    Packs/day: 1.00    Years: 5.00    Pack years: 5.00    Types: Cigarettes    Quit date: 02/11/2006    Years since quitting: 13.9  . Smokeless tobacco: Never Used  Substance Use Topics  . Alcohol use: No    Alcohol/week: 0.0 standard drinks   . Drug use: No      Review of Systems Constitutional: No fever/chills Eyes: No visual changes. ENT: No sore throat. Cardiovascular: Denies chest pain. Respiratory: Denies shortness of breath. Gastrointestinal: Right groin pain no nausea, no vomiting.  No diarrhea.  No constipation. Genitourinary: Negative for dysuria. Musculoskeletal: Right flank pain Skin: Negative for rash. Neurological: Negative for headaches, focal weakness or numbness. All other ROS negative ____________________________________________   PHYSICAL EXAM:  VITAL SIGNS: Blood pressure 138/66, pulse 62, temperature 97.6 F (36.4 C), temperature source Oral, resp. rate 18, height 5\' 6"  (1.676 m), weight 72.6 kg, last menstrual period 11/19/2015, SpO2 100 %.  Constitutional: Alert and oriented.  Appears to be in pain. Eyes: Conjunctivae are normal. EOMI. Head: Atraumatic. Nose: No congestion/rhinnorhea. Mouth/Throat: Mucous membranes are moist.   Neck: No stridor. Trachea Midline. FROM Cardiovascular: Normal rate, regular rhythm. Grossly normal heart sounds.  Good peripheral circulation. Respiratory: Normal respiratory effort.  No retractions. Lungs CTAB. Gastrointestinal: Soft   Lower quadrant pain no distention. No abdominal bruits.  Musculoskeletal: No lower extremity tenderness nor edema.  No joint effusions. Neurologic:  Normal speech and language. No gross focal neurologic deficits are appreciated.  Skin:  Skin is warm, dry and intact. No rash noted. Psychiatric: Mood and affect are normal. Speech and behavior are normal. GU: Deferred  Right flank pain ____________________________________________   LABS (all labs ordered are listed, but only abnormal results are displayed)  Labs Reviewed  COMPREHENSIVE METABOLIC PANEL - Abnormal; Notable for the following components:      Result Value   Glucose, Bld 127 (*)    Calcium 8.7 (*)    All other components within normal limits  URINALYSIS, COMPLETE  (UACMP) WITH MICROSCOPIC - Abnormal; Notable for the following components:   Color, Urine YELLOW (*)    APPearance CLEAR (*)    All other components within normal limits  CBC WITH DIFFERENTIAL/PLATELET  LIPASE, BLOOD  POC URINE PREG, ED  POCT PREGNANCY, URINE   ____________________________________________   RADIOLOGY  Official radiology report(s): CT Renal Stone Study  Result Date: 01/30/2020 CLINICAL DATA:  Flank pain with kidney stone suspected. EXAM: CT ABDOMEN AND PELVIS WITHOUT CONTRAST TECHNIQUE: Multidetector CT imaging of the abdomen and pelvis was performed following the standard protocol without IV contrast. COMPARISON:  07/19/2014 FINDINGS: Lower chest:  No contributory findings. Hepatobiliary: No focal liver abnormality.Cholecystectomy. No bile duct dilatation. Pancreas: Unremarkable. Spleen: Unremarkable. Adrenals/Urinary Tract: Negative adrenals. Moderate right hydroureteronephrosis due to a UVJ calculus measuring 2 mm. No additional urolithiasis. Left renal sinus cysts without left hydronephrosis. Unremarkable bladder. Stomach/Bowel: No obstruction. Appendectomy. No evidence of bowel inflammation. Vascular/Lymphatic: No acute vascular abnormality. No mass or adenopathy. Reproductive:Negative. Other: No ascites or pneumoperitoneum. Musculoskeletal: No acute abnormalities. Subcutaneous injection granulomas along the bilateral gluteal fat. IMPRESSION: Obstructing 2 mm stone at the right UVJ. Electronically Signed   By: Angelica Chessman  Watts M.D.   On: 01/30/2020 11:11    ____________________________________________   PROCEDURES  Procedure(s) performed (including Critical Care):  Procedures   ____________________________________________   INITIAL IMPRESSION / ASSESSMENT AND PLAN / ED COURSE  Lori [name] was evaluated in Emergency Department on 01/30/2020 for the symptoms described in the history of present illness. She was evaluated in the context of the global COVID-19 pandemic,  which necessitated consideration that the patient might be at risk for infection with the SARS-CoV-2 virus that causes COVID-19. Institutional protocols and algorithms that pertain to the evaluation of patients at risk for COVID-19 are in a state of rapid change based on information released by regulatory bodies including the CDC and federal and state organizations. These policies and algorithms were followed during the patient's care in the ED.    Patient presents with right-sided flank pain.  Most likely kidney stone.  Will get labs to evaluate for electrolyte abnormalities, AKI, UTI.  Lower suspicion for appendicitis given prior appendectomy.  Patient has not had a menstruation for 4 years and has had her tubes tied therefore unlikely pregnancy.  Will give patient IV Dilaudid, fluids, IV Zofran for pain control.  Patient CT scan is consistent with 2 mm obstructing kidney stone.  Labs are reassuring doubt anything dysfunction.  UA without evidence of UTI patient is afebrile without any white count elevation.  Reevaluated patient and her pain is much better.  She is had no vomiting.  It does not have evidence of a septic stone.  Discussed the results of the CT scan.  Discussed treatment with ibuprofen and oxycodone for breakthrough pain and not driving while on this.  Given some Zofran to help with nausea.  Holding off on Flomax due to allergies.  Patient given urology follow-up.  At this time they feel comfortable with discharge home with above treatments  I discussed the provisional nature of ED diagnosis, the treatment so far, the ongoing plan of care, follow up appointments and return precautions with the patient and any family or support people present. They expressed understanding and agreed with the plan, discharged home.   ____________________________________________   FINAL CLINICAL IMPRESSION(S) / ED DIAGNOSES   Final diagnoses:  Kidney stone on right side      MEDICATIONS GIVEN  DURING THIS VISIT:  Medications  oxyCODONE (Oxy IR/ROXICODONE) immediate release tablet 5 mg (has no administration in time range)  HYDROmorphone (DILAUDID) injection 0.5 mg (0.5 mg Intravenous Given 01/30/20 1045)  ondansetron (ZOFRAN) injection 4 mg (4 mg Intravenous Given 01/30/20 1044)  sodium chloride 0.9 % bolus 1,000 mL (1,000 mLs Intravenous New Bag/Given 01/30/20 1126)  ketorolac (TORADOL) 30 MG/ML injection 15 mg (15 mg Intravenous Given 01/30/20 1128)  HYDROmorphone (DILAUDID) injection 0.5 mg (0.5 mg Intravenous Given 01/30/20 1127)     ED Discharge Orders         Ordered    oxyCODONE (ROXICODONE) 5 MG immediate release tablet  Every 6 hours PRN     01/30/20 1233    ibuprofen (ADVIL) 600 MG tablet  Every 6 hours PRN     01/30/20 1233    ondansetron (ZOFRAN ODT) 4 MG disintegrating tablet  Every 8 hours PRN     01/30/20 1233           Note:  This document was prepared using Dragon voice recognition software and may include unintentional dictation errors.   Vanessa Augusta, MD 01/30/20 1236

## 2020-01-30 NOTE — Discharge Instructions (Addendum)
Take the ibuprofen with food.  Use the oxycodone for breakthrough pain.  Do not drive while on this.  The Zofran to help with nausea.  I was not able to prescribe Flomax due to your allergies.  This should pass on its own.  You can follow-up with urology if you are having difficulty passing it.  Return the ER for fevers, worsening pain, vomiting or any other concerns  IMPRESSION: Obstructing 2 mm stone at the right UVJ.

## 2020-01-30 NOTE — ED Triage Notes (Signed)
Pt arrived via POV with reports of right side flank pain since this morning along with nausea and dry heaves, hx of kidney stones

## 2020-02-02 ENCOUNTER — Telehealth: Payer: Self-pay | Admitting: Internal Medicine

## 2020-02-02 NOTE — Telephone Encounter (Addendum)
Spoke with both the patient and her daughter, Lori [name]. Lori [name] started Zetia about 2 weeks ago. For the last 4 days, she has experienced slight chest pressure and shortness of breath. The daughter looked up side effects of Zetia and thinks the medication is causing her symptoms.  She denies nausea, vomiting, dizziness, racing HR, swelling. O2 today = 96%. HR = 62. No BP available. They are convinced Zetia is the cause of her symptoms. Instructed them to [facility] and continue to monitor symptoms. Reiterated to both of them that her symptoms could have a cardiac cause and to make sure to call if symptoms do not improve after stopping Zetia. ED precautions reviewed as well.  Will send to Oda Kilts, PA for further recommendations.  Andy's RN will call the patient in a few days to check on her. At that time, she would like to know what medication to try instead if she feels better.

## 2020-02-02 NOTE — Telephone Encounter (Signed)
   Agree. Will also include [name] as Juluis Rainier.  She has not tolerated Atorvastatin or Zetia, so may be reasonable to refer to lipid clinic to consider other options.    Legrand Como "[employer], PA-C  02/02/2020 9:00 AM

## 2020-02-02 NOTE — Telephone Encounter (Signed)
New message  Pt c/o medication issue:  1. Name of Medication:   ezetimibe (ZETIA) 10 MG tablet   2. How are you currently taking this medication (dosage and times per day)? Take 1 tablet (10 mg total) by mouth daily.  3. Are you having a reaction (difficulty breathing--STAT)? Yes, SOB/difficulty breathing and chest pressure  4. What is your medication issue? Patient's daughter states that she looked up the side effects of the medication and difficulty breathing and chest pressure was on the side effects. Transferred to triage.

## 2020-02-09 ENCOUNTER — Telehealth: Payer: Self-pay

## 2020-02-09 DIAGNOSIS — E785 Hyperlipidemia, unspecified: Secondary | ICD-10-CM

## 2020-02-09 NOTE — Telephone Encounter (Signed)
I spoke to the patient and she is feeling much better since stopping the Zetia.  She is in agreement to referral to the [facility].  I told her that someone will contact her to arrange.  She verbalized understanding.

## 2020-02-09 NOTE — Telephone Encounter (Signed)
lpmtcb 4/14

## 2020-02-16 ENCOUNTER — Other Ambulatory Visit (HOSPITAL_COMMUNITY): Payer: Self-pay | Admitting: Podiatry

## 2020-02-16 ENCOUNTER — Other Ambulatory Visit: Payer: Self-pay | Admitting: Podiatry

## 2020-02-16 DIAGNOSIS — M25572 Pain in left ankle and joints of left foot: Secondary | ICD-10-CM

## 2020-02-16 DIAGNOSIS — G8929 Other chronic pain: Secondary | ICD-10-CM

## 2020-02-21 ENCOUNTER — Ambulatory Visit (INDEPENDENT_AMBULATORY_CARE_PROVIDER_SITE_OTHER): Payer: Federal, State, Local not specified - PPO | Admitting: Pharmacist

## 2020-02-21 ENCOUNTER — Other Ambulatory Visit: Payer: Self-pay

## 2020-02-21 DIAGNOSIS — E785 Hyperlipidemia, unspecified: Secondary | ICD-10-CM | POA: Diagnosis not present

## 2020-02-21 NOTE — Patient Instructions (Signed)
It was a pleasure to meet you!  I will submit a prior authorization for Praluent. I will call you once it is approved.  Your next injection is due 5/10  Please feel free to call us at [phone] with any questions or concerns.

## 2020-02-21 NOTE — Progress Notes (Signed)
Patient ID: Jamaiyah Rechner                 DOB: [birth year]                    MRN: [id number]     HPI: Toini Seta is a 54 y.o. female patient of [name], referred to lipid clinic by Oda Kilts, PA. PMH is significant for palpitations, HFpEF, chest discomfort, dysautonomia and familiar hyperlipidemia.  Patient presents today to lipid clinic. She is accompanied by a translator but does speak a fair amount of English. Originally from [place].  LDL has been as high as 219 in the past. Has not tolerated statins and most recently zetia. Patient reports memory issues, breathing issues and muscles aches with statin use and Zetia. Patient tearful about her condition and that she cannot take any of the medications. Our records show last lipid panel was in 2017 but patient states she had a lipid panel done a few months ago and the LDL was 199. States it was done at Dr. Humphrey Rolls office with [facility]. I will call office for the records.- received labs after conclusion of visit.  Current Medications: none Intolerances: atorvastatin 20mg (swelling), zetia, rosuvastatin 10mg , pravastatin 40mg , simvastatin 10mg  (memory issues, muscle aches) Risk Factors:  LDL goal: <100  Diet: patient states high in vegetables and fruit  Exercise: exercises often  Family History:  Family History  Problem Relation Age of Onset  . Hypertension Mother   . Heart disease Father 2       MI  . Cancer Maternal Aunt   . Uterine cancer Maternal Aunt   . Breast cancer Neg Hx   . Ovarian cancer Neg Hx   . Colon cancer Neg Hx      Social History: former smoker, no ETOH  Labs: 01/03/20 TC 290, TG 132, HDL 55, LDL 211 (no medication)  Past Medical History:  Diagnosis Date  . Asthma   . Cancer, uterine ([place]) 1998  . Chest pain    normal LV function by echo 10/2010  . High cholesterol   . 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 Kidney stone   . Palpitations     Current Outpatient Medications on File Prior to  Visit  Medication Sig Dispense Refill  . albuterol (PROVENTIL HFA;VENTOLIN HFA) 108 (90 Base) MCG/ACT inhaler TOME DOS INHALACIONES CADA SEIS HORAS CUANDO SEA NECESARIO FOR WHEEZING/ SHORTNESS OF BREATH 6.7 Inhaler 0  . BIOTIN PO Take 1 tablet by mouth daily.    . Collagen Hydrolysate, Bovine, POWD by Does not apply route.    . ezetimibe (ZETIA) 10 MG tablet Take 1 tablet (10 mg total) by mouth daily. 90 tablet 3  . furosemide (LASIX) 20 MG tablet Take 1 tablet (20 mg total) by mouth daily as needed. Medication increased. Please refill. Thank you 30 tablet 5  . Multiple Vitamin (MULTIVITAMIN) tablet Take 1 tablet by mouth daily.    . ondansetron (ZOFRAN ODT) 4 MG disintegrating tablet Take 1 tablet (4 mg total) by mouth every 8 (eight) hours as needed for nausea or vomiting. 20 tablet 0  . simvastatin (ZOCOR) 10 MG tablet TOME UNA TABLETA TODOS LOS D AS     No current facility-administered medications on file prior to visit.    Allergies  Allergen Reactions  . Atorvastatin Swelling  . Ciprofloxacin Hcl Other (See Comments)    Heart condition  Lowers blood pressure  . Cyclobenzaprine Other (See Comments)    Angina;  lowers BP  . Latex   . Levaquin [Levofloxacin In D5w]     Tremors, nausea  . Nortriptyline Other (See Comments)    Angina, lowers BP  . Pravastatin Other (See Comments)    gingivitis  . Prednisone Other (See Comments)    Has heart condition causes BP to gt too low.  Francine Graven Hives    Assessment/Plan:  1. Hyperlipidemia - LDL is significantly elevated per patient report. LDL goal <100. She has failed several statins. She is in need of significant LDL lowering. Therefore Nexletol would not be appropriate. We discussed PCSK9i, how they work, side effects in detail, injection technique and cost. Patient concerned about memory, diabetes, her kidney and her heart. I explained that this medication is beneficial for her heart and there are no reports of memory issues or  issues with blood sugar with Praluent. Patient is agreeable to try. Patient wished to do the first injection in the clinic. Therefore I provided her a sample of Praluent 75mg  and she correctly injected medication into her right thigh. I will submit prior auth for Praluent to pharmacy and will call her once everything is approved and Rx sent to pharmacy. Per patient, ok to call her without interrupter.  Thank you,  Ramond Dial, Pharm.D, BCPS, CPP [facility]  [id number], [place], [place] [address]  Phone: [phone]; Fax: [phone]

## 2020-02-22 ENCOUNTER — Telehealth: Payer: Self-pay | Admitting: Pharmacist

## 2020-02-22 DIAGNOSIS — E785 Hyperlipidemia, unspecified: Secondary | ICD-10-CM

## 2020-02-22 NOTE — Telephone Encounter (Signed)
Insurance company does not Health and safety inspector. They cover Repatha. Called the patient to discuss as she was hesitant to do Repatha due to the reports of elevated blood sugars. I submitted PA for Repatha just incase patient is willing to try.

## 2020-02-23 NOTE — Telephone Encounter (Signed)
Spoke with patient and her husband and explained that insurance will not pay for Praluent unless she has tried Repatha. Patient agreeable to trying Repatha. PA already submitted, awaiting response.

## 2020-02-25 MED ORDER — REPATHA SURECLICK 140 MG/ML ~~LOC~~ SOAJ: 1.0000 "pen " | SUBCUTANEOUS | 11 refills | Status: DC

## 2020-02-25 NOTE — Addendum Note (Signed)
Addended by: Tariana Moldovan E on: 02/25/2020 04:19 PM   Modules accepted: Orders

## 2020-02-25 NOTE — Telephone Encounter (Signed)
Repatha prior authorization approved through 02/24/21. Called pt and spoke with pt and her husband. They are aware Repatha rx has been sent to [facility] pharmacy in [place]. Activated $5 copay and sent to pharmacy as well. Scheduled follow up labs 6/1 to assess efficacy.

## 2020-03-02 ENCOUNTER — Ambulatory Visit
Admission: RE | Admit: 2020-03-02 | Discharge: 2020-03-02 | Disposition: A | Discharge status: Home / Self Care | Payer: Federal, State, Local not specified - PPO | Source: Ambulatory Visit | Attending: Podiatry | Admitting: Podiatry

## 2020-03-02 ENCOUNTER — Other Ambulatory Visit: Payer: Self-pay

## 2020-03-02 DIAGNOSIS — M25572 Pain in left ankle and joints of left foot: Secondary | ICD-10-CM | POA: Diagnosis not present

## 2020-03-02 DIAGNOSIS — G8929 Other chronic pain: Secondary | ICD-10-CM | POA: Diagnosis present

## 2020-03-06 ENCOUNTER — Other Ambulatory Visit: Payer: Self-pay | Admitting: Internal Medicine

## 2020-03-14 ENCOUNTER — Telehealth: Payer: Self-pay | Admitting: Pharmacist

## 2020-03-14 NOTE — Telephone Encounter (Signed)
Patient called stating that after she second Repatha injection, he back, hands, legs and arms started hurting. States she feels tired. Also states she is craving sugar which she normally does not.  I will submit PA for Praluent since its a lower dose and pt did tolerate first injection with praluent. Patient in agreement.

## 2020-03-28 ENCOUNTER — Other Ambulatory Visit: Payer: Federal, State, Local not specified - PPO

## 2020-04-05 MED ORDER — PRALUENT 75 MG/ML ~~LOC~~ SOAJ: 1.0000 "pen " | SUBCUTANEOUS | 11 refills | Status: DC

## 2020-05-11 ENCOUNTER — Other Ambulatory Visit: Payer: Self-pay | Admitting: Pharmacist

## 2020-05-11 ENCOUNTER — Other Ambulatory Visit: Payer: Self-pay

## 2020-05-11 MED ORDER — PRALUENT 75 MG/ML ~~LOC~~ SOAJ: 1.0000 "pen " | SUBCUTANEOUS | 11 refills | Status: DC

## 2020-05-12 ENCOUNTER — Other Ambulatory Visit: Payer: Self-pay | Admitting: Pharmacist

## 2020-05-12 MED ORDER — PRALUENT 75 MG/ML ~~LOC~~ SOAJ: 1.0000 "pen " | SUBCUTANEOUS | 11 refills | Status: DC

## 2020-05-30 ENCOUNTER — Other Ambulatory Visit: Payer: Federal, State, Local not specified - PPO

## 2020-05-30 ENCOUNTER — Other Ambulatory Visit: Payer: Self-pay

## 2020-05-30 DIAGNOSIS — E785 Hyperlipidemia, unspecified: Secondary | ICD-10-CM

## 2020-05-30 LAB — HEPATIC FUNCTION PANEL
ALT: 23 IU/L (ref 0–32)
AST: 19 IU/L (ref 0–40)
Albumin: 4.4 g/dL (ref 3.8–4.9)
Alkaline Phosphatase: 87 IU/L (ref 48–121)
Bilirubin Total: 0.3 mg/dL (ref 0.0–1.2)
Bilirubin, Direct: 0.1 mg/dL (ref 0.00–0.40)
Total Protein: 6.9 g/dL (ref 6.0–8.5)

## 2020-05-30 LAB — LIPID PANEL
Chol/HDL Ratio: 3.7 ratio (ref 0.0–4.4)
Cholesterol, Total: 198 mg/dL (ref 100–199)
HDL: 53 mg/dL (ref >39)
LDL Chol Calc (NIH): 124 mg/dL — ABNORMAL HIGH (ref 0–99)
Triglycerides: 120 mg/dL (ref 0–149)
VLDL Cholesterol Cal: 21 mg/dL (ref 5–40)

## 2020-08-28 ENCOUNTER — Other Ambulatory Visit: Payer: Self-pay

## 2020-08-28 ENCOUNTER — Telehealth: Payer: Self-pay | Admitting: Internal Medicine

## 2020-08-28 ENCOUNTER — Ambulatory Visit: Payer: Federal, State, Local not specified - PPO | Admitting: Physician Assistant

## 2020-08-28 ENCOUNTER — Encounter: Payer: Self-pay | Admitting: Physician Assistant

## 2020-08-28 VITALS — BP 118/64 | HR 63 | Ht 66.0 in | Wt 164.8 lb

## 2020-08-28 DIAGNOSIS — R072 Precordial pain: Secondary | ICD-10-CM

## 2020-08-28 DIAGNOSIS — G901 Familial dysautonomia [Riley-Day]: Secondary | ICD-10-CM

## 2020-08-28 NOTE — Patient Instructions (Signed)
Medication Instructions:   Your physician recommends that you continue on your current medications as directed. Please refer to the Current Medication list given to you today.  *If you need a refill on your cardiac medications before your next appointment, please call your pharmacy*   Lab Work: NONE ORDERED  TODAY   If you have labs (blood work) drawn today and your tests are completely normal, you will receive your results only by: . MyChart Message (if you have MyChart) OR . A paper copy in the mail If you have any lab test that is abnormal or we need to change your treatment, we will call you to review the results.   Testing/Procedures: NONE ORDERED  TODAY   Follow-Up: At [facility], you and your health needs are our priority.  As part of our continuing mission to provide you with exceptional heart care, we have created designated Provider Care Teams.  These Care Teams include your primary Cardiologist (physician) and Advanced Practice Providers (APPs -  Physician Assistants and Nurse Practitioners) who all work together to provide you with the care you need, when you need it.  We recommend signing up for the patient portal called "MyChart".  Sign up information is provided on this After Visit Summary.  MyChart is used to connect with patients for Virtual Visits (Telemedicine).  Patients are able to view lab/test results, encounter notes, upcoming appointments, etc.  Non-urgent messages can be sent to your provider as well.   To learn more about what you can do with MyChart, go to [email or web address].    Your next appointment:   1 year(s)  The format for your next appointment:   In Person  Provider:   Steven Klein, MD   Other Instructions   

## 2020-08-28 NOTE — Telephone Encounter (Signed)
Patient sent an appointment request through mychart stating "I am experiencing heart pain, joint pain, and general body aches." Please advise.

## 2020-08-28 NOTE — Telephone Encounter (Signed)
Called patient back. Patient stated she had her booster shot and now she is having heart issues. Patient stated she had aching in her chest. She stated she did take some pain medication and it did help. Patient stated she is really concerned due to her previous heart issues. Patient would like to be seen as soon as possible. Patient stated she did not consult her PCP because she does not understanding her heart condition. Made patient an appointment with Dr. Olin Pia PA.  Patient agreed to plan.
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
## 2020-08-29 ENCOUNTER — Ambulatory Visit: Payer: Federal, State, Local not specified - PPO | Admitting: Physician Assistant

## 2020-10-21 ENCOUNTER — Telehealth: Payer: Self-pay | Admitting: Physician Assistant

## 2020-10-21 NOTE — Telephone Encounter (Signed)
Paged by the patient's husband. She had contact with COVID patient on Sunday 12/19, symptom of fatigue and weakness started on Tuesday 12/21. Patient had 2 Moderna vaccine plus a 3rd booster shot (on Oct 26th). Since she is 5 days into her symptom with mild to moderate symptom, she would qualify for monoclonal antibody infusion. I have messaged [email or web address] who will contact them to arrange the infusion.   Her BP was 96/62 earlier, however husband says he checked BP again afterward and her BP is now 119/64. Her main symptom is fatigue, no SOB or chest pain. Will continue to monitor. She has been instructed to keep adequate hydration. If SBP persistently in the low 90s, or if she develop any kind of respiratory compromise or severe dizziness, then she will need to seek urgent medical attention in nearby the hospital. Husband is aware of the recommendation and thanked for the call back.   Hilbert Corrigan PA Pager: [phone]

## 2020-10-22 ENCOUNTER — Telehealth (HOSPITAL_COMMUNITY): Payer: Self-pay | Admitting: Nurse Practitioner

## 2020-10-22 DIAGNOSIS — U071 COVID-19: Secondary | ICD-10-CM

## 2020-10-22 NOTE — Telephone Encounter (Signed)
Called to discuss with Lori [name] about Covid symptoms and the use of a monoclonal antibody infusion for those with mild to moderate Covid symptoms and at a high risk of hospitalization.     Patient has received vaccine and booster (08/22/20) and suffered breakthrough infection. Symptoms are mild and improving. Unfortunately due to drug shortage, she does not qualify for monoclonal antibody treatment based on narrowed criteria. Symptoms tier reviewed as well as criteria for ending isolation. Preventative practices reviewed. 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

## 2020-10-24 NOTE — Telephone Encounter (Signed)
No tee

## 2020-10-24 NOTE — Telephone Encounter (Addendum)
Spoke with pt who states she is feeling much better today and her blood pressure is also much improved today.  Discussed the importance of hydration and nutrition (Na+ intake) especially during her illness.  Reviewed ED precautions.  Pt verbalized understanding and thanked Charity fundraiser for the call.

## 2020-11-27 NOTE — Progress Notes (Signed)
Office Visit    Patient Name: Lori [name] Date of Encounter: 11/28/2020  Primary Care Provider:  Perrin Maltese, MD Primary Cardiologist:  No primary care provider on file. Electrophysiologist:  Virl Axe, MD   Chief Complaint    Lori [name] is a 55 y.o. female with a hx of dysautonomia, palpitations, PVC, HFpEF presents today for hypotension  Past Medical History    Past Medical History:  Diagnosis Date  . Asthma   . Cancer, uterine ([place]) 1998  . Chest pain    normal LV function by echo 10/2010  . High cholesterol   . 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 Kidney stone   . Palpitations    Past Surgical History:  Procedure Laterality Date  . APPENDECTOMY    . CESAREAN SECTION     x2  . CHOLECYSTECTOMY    . neurocardiogenic syncope    . PATENT FORAMEN OVALE CLOSURE    . SURGERY ON FACE     AFTER FALL  . WRIST SURGERY     broken wrist,left    Allergies  Allergies  Allergen Reactions  . Atorvastatin Swelling  . Ciprofloxacin Hcl Other (See Comments)    Heart condition  Lowers blood pressure  . Cyclobenzaprine Other (See Comments)    Angina; lowers BP  . Latex   . Levaquin [Levofloxacin In D5w]     Tremors, nausea  . Nortriptyline Other (See Comments)    Angina, lowers BP  . Pravastatin Other (See Comments)    gingivitis  . Prednisone Other (See Comments)    Has heart condition causes BP to gt too low.  . Sulfamethizole Hives  . Repatha [Evolocumab] Other (See Comments)    myalgias    History of Present Illness    Lori [name] is a 55 y.o. female with a hx of dysautonomia, palpitations, PVC, HFpEF last seen 08/28/2020 by Tommye Standard, PA.  Longstanding history of dysautonomia.  History of hyperlipidemia with intolerance to statins and Zetia-now on Praluent and tolerating well.  Long-term monitor 07/01/2019 showing predominantly normal sinus rhythm.  One episode of PVC and pattern of trigeminy for about 10 seconds.  Nonsustained atrial tachycardia lasting 12  seconds which was asymptomatic.  She had an echocardiogram 07/15/2019 with LVEF 65-70%, no wall motion abnormalities, LV diastolic Doppler parameters consistent with pseudonormal LV filling, RV normal size and function, trivial TR.  She had COVID-19 in December 2021 thankfully with mild symptoms as she had completed her primary vaccination series as well as booster.  She sent a MyChart message yesterday noting low blood pressures with home readings as low as 87/45.  She presents today for follow-up.  Spanish interpreter present at time of exam for assistance though Miss Seranno's English is very good.  Tells me since having Covid in December she has felt bad, describes it as feeling "weak and terrible". Notes dyspnea on exertion with as little activity as walking to the kitchen. Has not been able to return to her exercise routine. No cough nor wheeze.   Tells me she feels a different pressure in her heart that is in the midsternal to left side of the region and occurs when she is dysneic on exertion.  Also notes heart palpitations on exertion and tells me she feels as if "my heart is growing".  The symptoms are all relieved by rest.   Tells me her blood pressure is routinely 90/60 in the morning and 100/60-70 in the afternoon when checked by her arm cuff. Drinks  2 large bottles of Evian water per day (either 1L or 1.5L each by her description) and consumes no caffeinate. Endorses eating small but regular meals.   Endorses occasional swelling in her ankles. Wears compression stockings daily with some relief. Takes her PRN Lasix once or twice a month. Has not needed more frequently. Endorses orthopnea but no PND since COVID. She elevates the head of her bed at night.   Position HR BP Symtpoms  Lying 65 126/82 None  Sitting 66 120/79 None  Standing (0 minutes) 66 120/76 None  Standing (3 minutes) Unable   to be  obtained    EKGs/Labs/Other Studies Reviewed:   The following studies were reviewed  today:  Echo 07/15/19 1. Left ventricular ejection fraction, by visual estimation, is 65 to  70%. The left ventricle has normal function. Left ventricular septal wall  thickness was normal. Normal left ventricular posterior wall thickness.  There is no left ventricular  hypertrophy.   2. Left ventricular diastolic Doppler parameters are consistent with  pseudonormalization pattern of LV diastolic filling.   3. Global right ventricle has normal systolic function.The right  ventricular size is normal. No increase in right ventricular wall  thickness.   4. Left atrial size was normal.   5. Right atrial size was normal.   6. The mitral valve is normal in structure. Trace mitral valve  regurgitation.   7. The tricuspid valve is normal in structure. Tricuspid valve  regurgitation is trivial.   8. The aortic valve The aortic valve is normal in structure. Aortic valve  regurgitation was not visualized by color flow Doppler.   9. The pulmonic valve was normal in structure. Pulmonic valve  regurgitation is trivial by color flow Doppler.  10. The atrial septum is grossly normal.   Long term monitor 07/01/19 Indication: palpitations   Duration: 8d   Findings Heart pounding >> associated with sinus rhythm, mostly normal rates, occasionally faster               On one occasion she had PVCs for about 10 sec in a pattern of trigeminy Non sustained atrial tach 12 secs long not associated with symptoms  Conclusions No serious arrhythmia found  EKG:  EKG is  ordered today.  The ekg ordered today demonstrates NSR 67 bpm with no acute ST/T wave changes.   Recent Labs: 01/30/2020: BUN 15; Creatinine, Ser 0.71; Potassium 3.9; Sodium 138 05/30/2020: ALT 23 11/28/2020: Hemoglobin 14.3; Platelets 241  Recent Lipid Panel    Component Value Date/Time   CHOL 198 05/30/2020 0905   TRIG 120 05/30/2020 0905   HDL 53 05/30/2020 0905   CHOLHDL 3.7 05/30/2020 0905   CHOLHDL 5.6 Ratio 11/21/2010 2134   VLDL  22 11/21/2010 2134   LDLCALC 124 (H) 05/30/2020 0905    Home Medications   Current Meds  Medication Sig  . albuterol (PROVENTIL HFA;VENTOLIN HFA) 108 (90 Base) MCG/ACT inhaler TOME DOS INHALACIONES CADA SEIS HORAS CUANDO SEA NECESARIO FOR WHEEZING/ SHORTNESS OF BREATH  . Alirocumab (PRALUENT) 75 MG/ML SOAJ Inject 1 pen into the skin every 14 (fourteen) days.  Marland Kitchen BIOTIN PO Take 1 tablet by mouth daily.  . Collagen Hydrolysate, Bovine, POWD by Does not apply route.  . furosemide (LASIX) 20 MG tablet TAKE 1 TABLET (20 MG TOTAL) BY MOUTH DAILY AS NEEDED.  . Multiple Vitamin (MULTIVITAMIN) tablet Take 1 tablet by mouth daily.    Review of Systems      Review of Systems  Constitutional: Positive  for malaise/fatigue. Negative for chills and fever.  Cardiovascular: Positive for dyspnea on exertion, leg swelling and palpitations. Negative for chest pain, near-syncope, orthopnea and syncope.  Respiratory: Negative for cough, shortness of breath and wheezing.   Gastrointestinal: Negative for nausea and vomiting.  Neurological: Negative for dizziness, light-headedness and weakness.  Psychiatric/Behavioral: The patient is nervous/anxious.    All other systems reviewed and are otherwise negative except as noted above.  Physical Exam    VS:  BP 117/75 (BP Location: Left Arm, Patient Position: Sitting, Cuff Size: Normal)   Pulse 67   Ht 5\' 6"  (1.676 m)   Wt 169 lb (76.7 kg)   LMP 11/19/2015   SpO2 97%   BMI 27.28 kg/m  , BMI Body mass index is 27.28 kg/m.  Wt Readings from Last 3 Encounters:  11/28/20 169 lb (76.7 kg)  08/28/20 164 lb 12.8 oz (74.8 kg)  01/30/20 160 lb (72.6 kg)    GEN: Well nourished, well developed, in no acute distress. HEENT: normal. Neck: Supple, no JVD, carotid bruits, or masses. Cardiac: RRR, no murmurs, rubs, or gallops. No clubbing, cyanosis, edema.  Radials//PT 2+ and equal bilaterally.  Respiratory:  Respirations regular and unlabored, clear to auscultation  bilaterally. GI: Soft, nontender, nondistended. MS: No deformity or atrophy. Skin: Warm and dry, no rash. Neuro:  Strength and sensation are intact. Anxious. Psych: Normal affect.  Assessment & Plan    1. Dysautonomia - Reports low blood pressure over the last few weeks. Overall improving with readings 90/60-100s/70s. Encouraged to continue staying well hydrated. Anticipate that she was dehydrated during her active COVID 19 infection in December which precipitated symptoms. Continue to avoid caffeine. Plan for echocardiogram and CXR to assess for any heart failure or pulmonary abnormality as contributory. Orthostatic vital signs today were negative.   2. S/p COVID 19 - Anticipate lingering effects from [place] could be etiology of many of her symptoms including fatigue and dyspnea. Due to dyspnea associated with pleuritic chest discomfort, orthopnea we will plan to update echocardiogram. CXR today to assess lungs.   3. Pleuritic chest pain - Reports chest pain with deep breath as well as when she gets dyspneic. EKG today NSR with no acute ST/T wave changes. Echocardiogram, as above. No indication for ischemic evaluation at this time as symptoms are all subsequent to dyspnea. If workup above unrevealing, could consider Lexiscan vs cardiac CTA.  4. HLD - Follows with lipid clinic. Continue Praluent.  5. Palpitations - Reports sensation of pounding, fast heart when she is dyspneic. To rule out anemia, thyroid, electrolyte abnormality plan for CBC, BMP, magnesium, TSh today. Discussed that stress, illness, dehydration can all trigger palpitations. Plan for echocardiogram, as above. If workup unrevealing consider ZIO monitor. Hesitant to add BB due to relatively low BP.   6. Statin intolerance / Myalgia - Previous history of statin intolerance with extreme myalgia. Continue Praluent, as above.   7. HFpEF - Euvolemic and well compensated. GDMT includes PRN lasix. Unable to further uptitrate due to  relative hypotension.   Disposition: Follow up in 1 month(s) with [name] or APP   Signed, Loel Dubonnet, NP 11/28/2020, 11:04 AM [place]

## 2020-11-28 ENCOUNTER — Ambulatory Visit
Admission: RE | Admit: 2020-11-28 | Discharge: 2020-11-28 | Disposition: A | Discharge status: Home / Self Care | Payer: Federal, State, Local not specified - PPO | Attending: Family | Admitting: Family

## 2020-11-28 ENCOUNTER — Ambulatory Visit
Admission: RE | Admit: 2020-11-28 | Discharge: 2020-11-28 | Disposition: A | Discharge status: Home / Self Care | Payer: Federal, State, Local not specified - PPO | Source: Ambulatory Visit | Attending: Family | Admitting: Family

## 2020-11-28 ENCOUNTER — Encounter: Payer: Self-pay | Admitting: Family

## 2020-11-28 ENCOUNTER — Other Ambulatory Visit: Payer: Self-pay

## 2020-11-28 ENCOUNTER — Other Ambulatory Visit
Admission: RE | Admit: 2020-11-28 | Discharge: 2020-11-28 | Disposition: A | Discharge status: Home / Self Care | Payer: Federal, State, Local not specified - PPO | Source: Home / Self Care | Attending: Family | Admitting: Family

## 2020-11-28 ENCOUNTER — Ambulatory Visit: Payer: Federal, State, Local not specified - PPO | Admitting: Family

## 2020-11-28 VITALS — BP 117/75 | HR 67 | Ht 66.0 in | Wt 169.0 lb

## 2020-11-28 DIAGNOSIS — R002 Palpitations: Secondary | ICD-10-CM | POA: Diagnosis not present

## 2020-11-28 DIAGNOSIS — R0609 Other forms of dyspnea: Secondary | ICD-10-CM

## 2020-11-28 DIAGNOSIS — M791 Myalgia, unspecified site: Secondary | ICD-10-CM

## 2020-11-28 DIAGNOSIS — Z789 Other specified health status: Secondary | ICD-10-CM

## 2020-11-28 DIAGNOSIS — I5032 Chronic diastolic (congestive) heart failure: Secondary | ICD-10-CM

## 2020-11-28 DIAGNOSIS — G901 Familial dysautonomia [Riley-Day]: Secondary | ICD-10-CM

## 2020-11-28 DIAGNOSIS — R0781 Pleurodynia: Secondary | ICD-10-CM

## 2020-11-28 DIAGNOSIS — R0600 Dyspnea, unspecified: Secondary | ICD-10-CM

## 2020-11-28 DIAGNOSIS — R072 Precordial pain: Secondary | ICD-10-CM

## 2020-11-28 DIAGNOSIS — E782 Mixed hyperlipidemia: Secondary | ICD-10-CM

## 2020-11-28 LAB — CBC
HCT: 43.9 % (ref 36.0–46.0)
Hemoglobin: 14.3 g/dL (ref 12.0–15.0)
MCH: 28.7 pg (ref 26.0–34.0)
MCHC: 32.6 g/dL (ref 30.0–36.0)
MCV: 88 fL (ref 80.0–100.0)
Platelets: 241 10*3/uL (ref 150–400)
RBC: 4.99 MIL/uL (ref 3.87–5.11)
RDW: 13.2 % (ref 11.5–15.5)
WBC: 7.7 10*3/uL (ref 4.0–10.5)
nRBC: 0 % (ref 0.0–0.2)

## 2020-11-28 LAB — COMPREHENSIVE METABOLIC PANEL
ALT: 23 U/L (ref 0–44)
AST: 20 U/L (ref 15–41)
Albumin: 4.3 g/dL (ref 3.5–5.0)
Alkaline Phosphatase: 73 U/L (ref 38–126)
Anion gap: 8 (ref 5–15)
BUN: 17 mg/dL (ref 6–20)
CO2: 29 mmol/L (ref 22–32)
Calcium: 9.4 mg/dL (ref 8.9–10.3)
Chloride: 105 mmol/L (ref 98–111)
Creatinine, Ser: 0.64 mg/dL (ref 0.44–1.00)
GFR, Estimated: >60 mL/min (ref >60)
Glucose, Bld: 106 mg/dL — ABNORMAL HIGH (ref 70–99)
Potassium: 4.6 mmol/L (ref 3.5–5.1)
Sodium: 142 mmol/L (ref 135–145)
Total Bilirubin: 0.6 mg/dL (ref 0.3–1.2)
Total Protein: 7.8 g/dL (ref 6.5–8.1)

## 2020-11-28 LAB — MAGNESIUM: Magnesium: 2.1 mg/dL (ref 1.7–2.4)

## 2020-11-28 LAB — TSH: TSH: 1.849 u[IU]/mL (ref 0.350–4.500)

## 2020-11-28 NOTE — Patient Instructions (Addendum)
Medication Instructions:  No medication changes today.   *If you need a refill on your cardiac medications before your next appointment, please call your pharmacy*   Lab Work:  -  Your provider recommends that you return for lab work today at [employer] at [facility]: CBC, BMP, magnesium, TSH -  Please go to the [facility]. You will check in at the front desk to the right as you walk into the atrium.    If you have labs (blood work) drawn today and your tests are completely normal, you will receive your results only by: Marland Kitchen MyChart Message (if you have MyChart) OR . A paper copy in the mail If you have any lab test that is abnormal or we need to change your treatment, we will call you to review the results.   Testing/Procedures: Your physician has requested that you have an echocardiogram. Echocardiography is a painless test that uses sound waves to create images of your heart. It provides your doctor with information about the size and shape of your heart and how well your heart's chambers and valves are working. This procedure takes approximately one hour. There are no restrictions for this procedure.  A chest x-ray takes a picture of the organs and structures inside the chest, including the heart, lungs, and blood vessels. This test can show several things, including, whether the heart is enlarges; whether fluid is building up in the lungs; and whether pacemaker / defibrillator leads are still in place. -  Please go to the [facility]. You will check in at the front desk to the right as you walk into the atrium.    Follow-Up: At [facility], you and your health needs are our priority.  As part of our continuing mission to provide you with exceptional heart care, we have created designated Provider Care Teams.  These Care Teams include your primary Cardiologist (physician) and Advanced Practice Providers (APPs -  Physician Assistants and Nurse Practitioners) who all work  together to provide you with the care you need, when you need it.  We recommend signing up for the patient portal called "MyChart".  Sign up information is provided on this After Visit Summary.  MyChart is used to connect with patients for Virtual Visits (Telemedicine).  Patients are able to view lab/test results, encounter notes, upcoming appointments, etc.  Non-urgent messages can be sent to your provider as well.   To learn more about what you can do with MyChart, go to [email or web address].    Your next appointment:   1 month(s)  The format for your next appointment:   In Person  Provider:   You may see [name] or one of the following Advanced Practice Providers on your designated Care Team:    Murray Hodgkins, NP  Christell Faith, PA-C  Marrianne Mood, PA-C  [facility], [place]  Laurann Montana, NP  Other Instructions   Eat small, regular meals.  Avoid eating very hot temperature foods as this can lower your blood pressure.   Stay well hydrated.

## 2020-12-14 ENCOUNTER — Ambulatory Visit (INDEPENDENT_AMBULATORY_CARE_PROVIDER_SITE_OTHER): Payer: Federal, State, Local not specified - PPO

## 2020-12-14 ENCOUNTER — Other Ambulatory Visit: Payer: Self-pay

## 2020-12-14 DIAGNOSIS — G901 Familial dysautonomia [Riley-Day]: Secondary | ICD-10-CM

## 2020-12-14 DIAGNOSIS — R0600 Dyspnea, unspecified: Secondary | ICD-10-CM

## 2020-12-14 DIAGNOSIS — R002 Palpitations: Secondary | ICD-10-CM | POA: Diagnosis not present

## 2020-12-14 DIAGNOSIS — R0609 Other forms of dyspnea: Secondary | ICD-10-CM

## 2020-12-14 LAB — ECHOCARDIOGRAM COMPLETE
Area-P 1/2: 3.6 cm2
S' Lateral: 3 cm

## 2020-12-26 ENCOUNTER — Ambulatory Visit: Payer: Federal, State, Local not specified - PPO | Admitting: Family

## 2021-01-02 ENCOUNTER — Ambulatory Visit: Payer: Federal, State, Local not specified - PPO | Admitting: Family

## 2021-01-02 ENCOUNTER — Other Ambulatory Visit: Payer: Self-pay

## 2021-01-02 ENCOUNTER — Encounter: Payer: Self-pay | Admitting: Family

## 2021-01-02 VITALS — BP 114/80 | HR 75 | Ht 66.0 in | Wt 169.0 lb

## 2021-01-02 DIAGNOSIS — Z789 Other specified health status: Secondary | ICD-10-CM

## 2021-01-02 DIAGNOSIS — G901 Familial dysautonomia [Riley-Day]: Secondary | ICD-10-CM | POA: Diagnosis not present

## 2021-01-02 DIAGNOSIS — R002 Palpitations: Secondary | ICD-10-CM

## 2021-01-02 DIAGNOSIS — E782 Mixed hyperlipidemia: Secondary | ICD-10-CM | POA: Diagnosis not present

## 2021-01-02 NOTE — Progress Notes (Signed)
Office Visit    Patient Name: Lori [name] Date of Encounter: 01/02/2021  Primary Care Provider:  Perrin Maltese, MD Primary Cardiologist:  No primary care provider on file. Electrophysiologist:  Lori Axe, MD   Chief Complaint    Lori [name] is a 55 y.o. female with a hx of dysautonomia, palpitations, PVC, HFpEF presents today for follow-up after echocardiogram  Past Medical History    Past Medical History:  Diagnosis Date  . Asthma   . Cancer, uterine ([place]) 1998  . Chest pain    normal LV function by echo 10/2010  . High cholesterol   . 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 Kidney stone   . Palpitations    Past Surgical History:  Procedure Laterality Date  . APPENDECTOMY    . CESAREAN SECTION     x2  . CHOLECYSTECTOMY    . neurocardiogenic syncope    . PATENT FORAMEN OVALE CLOSURE    . SURGERY ON FACE     AFTER FALL  . WRIST SURGERY     broken wrist,left    Allergies  Allergies  Allergen Reactions  . Atorvastatin Swelling  . Ciprofloxacin Hcl Other (See Comments)    Heart condition  Lowers blood pressure  . Cyclobenzaprine Other (See Comments)    Angina; lowers BP  . Latex   . Levaquin [Levofloxacin In D5w]     Tremors, nausea  . Nortriptyline Other (See Comments)    Angina, lowers BP  . Pravastatin Other (See Comments)    gingivitis  . Prednisone Other (See Comments)    Has heart condition causes BP to gt too low.  . Sulfamethizole Hives  . Repatha [Evolocumab] Other (See Comments)    myalgias    History of Present Illness    Lori [name] is a 55 y.o. female with a hx of dysautonomia, palpitations, PVC, HFpEF last seen 11/28/2020  Longstanding history of dysautonomia.  History of hyperlipidemia with intolerance to statins and Zetia-now on Praluent and tolerating well.  Long-term monitor 07/01/2019 showing predominantly normal sinus rhythm.  One episode of PVC and pattern of trigeminy for about 10 seconds.  Nonsustained atrial tachycardia lasting 12  seconds which was asymptomatic.  She had an echocardiogram 07/15/2019 with LVEF 65-70%, no wall motion abnormalities, LV diastolic Doppler parameters consistent with pseudonormal LV filling, RV normal size and function, trivial TR.  She had COVID-19 in December 2021 thankfully with mild symptoms as she had completed her primary vaccination series as well as booster.  She was seen in follow-up 11/28/2020 after sending MyChart message noting low blood pressures with home readings as low as 87/85.  Since December she had felt "weak and terrible ".  Noted dyspnea on exertion with as little as activity as walking to the kitchen.  Orthostatic vital signs were negative.  She was recommended for echocardiogram.  Echo 12/14/2020 LVEF 60 to 05%, normal diastolic parameters, no R WMA, RV normal size and function, normal PASP, mild MR.  She presents today for follow-up.  Visit completed with Spanish interpreter though Lori [name] speaks remarkable English. Reviewed her echocardiogram in detail.  She was reassured by the result.  She reports that she has worked her way up to walking 1 to 2 miles daily with her dog without dyspnea on exertion.  Tells me her blood pressure has been well controlled and no recurrent hypotension.  She denies lightheadedness, dizziness, near-syncope, syncope.  No lower extremity edema.  Reports no chest pain, pressure or tightness.  Tells  me her fatigue is overall improving.  She is possibly planning a trip to see her mother in [place].  EKGs/Labs/Other Studies Reviewed:   The following studies were reviewed today:  Echo 07/15/19 1. Left ventricular ejection fraction, by visual estimation, is 65 to  70%. The left ventricle has normal function. Left ventricular septal wall  thickness was normal. Normal left ventricular posterior wall thickness.  There is no left ventricular  hypertrophy.   2. Left ventricular diastolic Doppler parameters are consistent with  pseudonormalization  pattern of LV diastolic filling.   3. Global right ventricle has normal systolic function.The right  ventricular size is normal. No increase in right ventricular wall  thickness.   4. Left atrial size was normal.   5. Right atrial size was normal.   6. The mitral valve is normal in structure. Trace mitral valve  regurgitation.   7. The tricuspid valve is normal in structure. Tricuspid valve  regurgitation is trivial.   8. The aortic valve The aortic valve is normal in structure. Aortic valve  regurgitation was not visualized by color flow Doppler.   9. The pulmonic valve was normal in structure. Pulmonic valve  regurgitation is trivial by color flow Doppler.  10. The atrial septum is grossly normal.   Long term monitor 07/01/19 Indication: palpitations   Duration: 8d   Findings Heart pounding >> associated with sinus rhythm, mostly normal rates, occasionally faster               On one occasion she had PVCs for about 10 sec in a pattern of trigeminy Non sustained atrial tach 12 secs long not associated with symptoms  Conclusions No serious arrhythmia found  EKG: No EKG is  ordered today.  The ekg independently reviewed from 12/17/2020 demonstrated NSR 67 bpm with no acute ST/T wave changes.   Recent Labs: 11/28/2020: ALT 23; BUN 17; Creatinine, Ser 0.64; Hemoglobin 14.3; Magnesium 2.1; Platelets 241; Potassium 4.6; Sodium 142; TSH 1.849  Recent Lipid Panel    Component Value Date/Time   CHOL 198 05/30/2020 0905   TRIG 120 05/30/2020 0905   HDL 53 05/30/2020 0905   CHOLHDL 3.7 05/30/2020 0905   CHOLHDL 5.6 Ratio 11/21/2010 2134   VLDL 22 11/21/2010 2134   LDLCALC 124 (H) 05/30/2020 0905    Home Medications   Current Meds  Medication Sig  . albuterol (PROVENTIL HFA;VENTOLIN HFA) 108 (90 Base) MCG/ACT inhaler TOME DOS INHALACIONES CADA SEIS HORAS CUANDO SEA NECESARIO FOR WHEEZING/ SHORTNESS OF BREATH  . Alirocumab (PRALUENT) 75 MG/ML SOAJ Inject 1 pen into the skin every 14  (fourteen) days.  . Azelastine-Fluticasone 137-50 MCG/ACT SUSP Place 2 sprays into both nostrils daily as needed.  Marland Kitchen BIOTIN PO Take 1 tablet by mouth daily.  . Collagen Hydrolysate, Bovine, POWD Takes occassionally  . furosemide (LASIX) 20 MG tablet TAKE 1 TABLET (20 MG TOTAL) BY MOUTH DAILY AS NEEDED.  . Multiple Vitamin (MULTIVITAMIN) tablet Take 1 tablet by mouth daily.    Review of Systems      All other systems reviewed and are otherwise negative except as noted above.  Physical Exam    VS:  BP 114/80 (BP Location: Left Arm, Patient Position: Sitting, Cuff Size: Normal)   Pulse 75   Ht 5\' 6"  (1.676 m)   Wt 169 lb (76.7 kg)   LMP 11/19/2015   SpO2 98%   BMI 27.28 kg/m  , BMI Body mass index is 27.28 kg/m.  Wt Readings from Last 3  Encounters:  01/02/21 169 lb (76.7 kg)  11/28/20 169 lb (76.7 kg)  08/28/20 164 lb 12.8 oz (74.8 kg)    GEN: Well nourished, well developed, in no acute distress. HEENT: normal. Neck: Supple, no JVD, carotid bruits, or masses. Cardiac: RRR, no murmurs, rubs, or gallops. No clubbing, cyanosis, edema.  Radials//PT 2+ and equal bilaterally.  Respiratory:  Respirations regular and unlabored, clear to auscultation bilaterally. GI: Soft, nontender, nondistended. MS: No deformity or atrophy. Skin: Warm and dry, no rash. Neuro:  Strength and sensation are intact. Psych: Normal affect  Assessment & Plan   1. Dysautonomia -BP well controlled at home with no recurrent hypotension.  Asymptomatic with no lightheadedness, dizziness. Encouraged to continue staying well hydrated. Previous orthostatic vital signs were negative.  Recent lab work with normal electrolytes, hemoglobin, TSH.  Echo 12/14/2020 with normal LVEF, no RWMA, mild MR.  Previous exams of dysautonomia was likely in the setting of poor p.o. intake in the setting of COVID-19 and deconditioning.  They have since resolved.   2. S/p COVID 19 - COVID19 09/2020 not requiring hospitalization. Reports  lingering symptoms from COVID-19 have resolved.  She is able to walk 1 to 2 miles per day without dyspnea.  Echocardiogram 12/14/20 with normal LVEF, no heart WMA, mild MR.  No signs of damage from COVID-19.   3. Pleuritic chest pain -no recurrent chest pain.  Echo 12/14/2020 with no R WMA.  No indication for further evaluation this time.  4. HLD - Follows with lipid clinic. Continue Praluent.  5. Palpitations -resolved since last seen.  Echo 12/14/2020 with normal LVEF, mild MR.  No indication for further evaluation at this time.   6. Statin intolerance / Myalgia - Previous history of statin intolerance with extreme myalgia. Continue Praluent, as above.   7. HFpEF - Euvolemic and well compensated. GDMT includes PRN lasix. Unable to further uptitrate due to relative hypotension.   Disposition: Follow up in November 2022 with [name] or APP   Signed, Loel Dubonnet, NP 01/02/2021, 9:37 AM [place]

## 2021-01-02 NOTE — Patient Instructions (Signed)
Medication Instructions:  Continue your current medications.   *If you need a refill on your cardiac medications before your next appointment, please call your pharmacy*   Lab Work: No lab work today.   Testing/Procedures: Your echocardiogram looked great!  Follow-Up: At [facility], you and your health needs are our priority.  As part of our continuing mission to provide you with exceptional heart care, we have created designated Provider Care Teams.  These Care Teams include your primary Cardiologist (physician) and Advanced Practice Providers (APPs -  Physician Assistants and Nurse Practitioners) who all work together to provide you with the care you need, when you need it.  We recommend signing up for the patient portal called "MyChart".  Sign up information is provided on this After Visit Summary.  MyChart is used to connect with patients for Virtual Visits (Telemedicine).  Patients are able to view lab/test results, encounter notes, upcoming appointments, etc.  Non-urgent messages can be sent to your provider as well.   To learn more about what you can do with MyChart, go to [email or web address].    Your next appointment:   November 2022   The format for your next appointment:   In Person  Provider:   Virl Axe, MD

## 2021-01-29 ENCOUNTER — Telehealth: Payer: Self-pay

## 2021-01-29 DIAGNOSIS — E785 Hyperlipidemia, unspecified: Secondary | ICD-10-CM

## 2021-01-29 NOTE — Telephone Encounter (Signed)
Called and spoke w/pt regarding the need for cholesterol labs prior to a pa for pcsk9 therapy and the pt voiced understanding so we scheduled it for 4/11

## 2021-02-06 ENCOUNTER — Other Ambulatory Visit: Payer: Federal, State, Local not specified - PPO

## 2021-02-19 ENCOUNTER — Other Ambulatory Visit: Payer: Self-pay | Admitting: Pharmacist

## 2021-02-19 MED ORDER — PRALUENT 75 MG/ML ~~LOC~~ SOAJ: 1.0000 "pen " | SUBCUTANEOUS | 11 refills | Status: DC

## 2021-05-17 ENCOUNTER — Telehealth: Payer: Self-pay

## 2021-05-17 NOTE — Telephone Encounter (Signed)
Called to schedule labs and they stated that they are not able to get labs until august as they are leaving for [place] for a month. She stated that he is having spots on her face and is concerned praluent may be causing them I will route to pharmd pool

## 2021-05-17 NOTE — Telephone Encounter (Signed)
-----   Message from Ramond Dial, [place] sent at 05/17/2021  7:47 AM EDT ----- Please call and see if she is still on Praluent. See if she is willing to get labs done at [facility] ----- Message ----- From: Ramond Dial, RPH-CPP Sent: 05/17/2021  12:00 AM EDT To: Ramond Dial, RPH-CPP   ----- Message ----- From: Ramond Dial, RPH-CPP Sent: 04/27/2021  12:00 AM EDT To: Ramond Dial, RPH-CPP  Set up lipids

## 2021-05-18 NOTE — Telephone Encounter (Signed)
Called and spoke with patient. She is currently in [place]. Says her skin "has changed" since starting the Praluent.  Advised to hold Praluent for time being and can be reassessed when she comes back to [place].

## 2022-01-01 ENCOUNTER — Encounter: Payer: Self-pay | Admitting: Podiatry

## 2022-01-01 ENCOUNTER — Ambulatory Visit (INDEPENDENT_AMBULATORY_CARE_PROVIDER_SITE_OTHER): Payer: Federal, State, Local not specified - PPO

## 2022-01-01 ENCOUNTER — Other Ambulatory Visit: Payer: Self-pay

## 2022-01-01 ENCOUNTER — Ambulatory Visit (INDEPENDENT_AMBULATORY_CARE_PROVIDER_SITE_OTHER): Payer: Federal, State, Local not specified - PPO | Admitting: Podiatry

## 2022-01-01 DIAGNOSIS — M2011 Hallux valgus (acquired), right foot: Secondary | ICD-10-CM

## 2022-01-01 DIAGNOSIS — M2012 Hallux valgus (acquired), left foot: Secondary | ICD-10-CM | POA: Diagnosis not present

## 2022-01-01 NOTE — Progress Notes (Signed)
? ?Subjective: 56 y.o. female presents today for new complaint today regarding pain and tenderness associated to her bunions to the bilateral feet.  She states that she has had bunions for several years however over the past year they have become increasingly painful and tender.  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  She says she experiences significant pain and tenderness.  She denies a history of injury.  She presents for further treatment and evaluation ? ?Past Medical History:  ?Diagnosis Date  ? Asthma   ? Cancer, uterine ([place]) 1998  ? Chest pain   ? normal LV function by echo 10/2010  ? High cholesterol   ? 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 Kidney stone   ? Palpitations   ? ?Past Surgical History:  ?Procedure Laterality Date  ? APPENDECTOMY    ? CESAREAN SECTION    ? x2  ? CHOLECYSTECTOMY    ? neurocardiogenic syncope    ? PATENT FORAMEN OVALE CLOSURE    ? SURGERY ON FACE    ? AFTER FALL  ? WRIST SURGERY    ? broken wrist,left  ? ?Allergies  ?Allergen Reactions  ? Atorvastatin Swelling  ? Ciprofloxacin Hcl Other (See Comments)  ?  Heart condition  Lowers blood pressure  ? Cyclobenzaprine Other (See Comments)  ?  Angina; lowers BP  ? Latex   ? Levaquin [Levofloxacin In D5w]   ?  Tremors, nausea  ? Nortriptyline Other (See Comments)  ?  Angina, lowers BP  ? Pravastatin Other (See Comments)  ?  gingivitis  ? Prednisone Other (See Comments)  ?  Has heart condition causes BP to gt too low.  ? Sulfamethizole Hives  ? Repatha [Evolocumab] Other (See Comments)  ?  myalgias  ? ? ?Objective: ?Physical Exam ?General: The patient is alert and oriented x3 in no acute distress. ? ?Dermatology: Skin is cool, dry and supple bilateral lower extremities. Negative for open lesions or macerations. ? ?Vascular: Palpable pedal pulses bilaterally. No edema or erythema noted. Capillary refill within normal limits. ? ?Neurological: Epicritic and protective threshold grossly intact  bilaterally.  ? ?Musculoskeletal Exam: Clinical evidence of bunion deformity noted to the respective foot. There is moderate pain on palpation range of motion of the first MPJ. Lateral deviation of the hallux noted consistent with hallux abductovalgus. ? ?Radiographic Exam: Increased intermetatarsal angle greater than 15? with a hallux abductus angle greater than 30? noted on AP view.  Joint spaces preserved ? ?Assessment: ?1. HAV w/ bunion deformity bilateral ? ? ?Plan of Care:  ?1. Patient was evaluated. X-Rays reviewed. ?2. Today we discussed the conservative versus surgical management of the presenting pathology. The patient opts for surgical management. All possible complications and details of the procedure were explained. All patient questions were answered. No guarantees were expressed or implied. ?3. Patient states that she is very busy with work and she would like to have surgery in [place] of this year.  Recommend that she returns to the office about 1-2 months prior for surgical consultation and to discuss any additional questions she may have leading up to surgery. ?4.  Return to clinic fall for surgical consult ? 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 Husband had bunion sx in the past and is coming in for evaluation as well ? ? ?Edrick Kins, DPM ?[place] ? ?Dr. Edrick Kins, DPM  ?  ?[place]                                        ?  [place], [place] [address]                ?Office [phone]  ?Fax [phone] ? ? ? ? ? ?

## 2022-01-22 ENCOUNTER — Telehealth: Payer: Self-pay

## 2022-01-22 NOTE — Telephone Encounter (Signed)
Called with an interpreter but wasn't able to leave msg for pt to complete labs ?

## 2022-02-07 NOTE — Telephone Encounter (Signed)
Scheduled with pt at the [place] offic 02/11/22 ?

## 2022-02-11 ENCOUNTER — Other Ambulatory Visit: Payer: Federal, State, Local not specified - PPO

## 2022-02-12 NOTE — Telephone Encounter (Signed)
Lmom to r/s  ?

## 2022-02-20 ENCOUNTER — Other Ambulatory Visit: Payer: Self-pay | Admitting: Internal Medicine

## 2022-02-21 ENCOUNTER — Telehealth: Payer: Self-pay

## 2022-02-21 ENCOUNTER — Other Ambulatory Visit: Payer: Self-pay | Admitting: Internal Medicine

## 2022-02-21 NOTE — Telephone Encounter (Signed)
Used interpreter services to attempt to schedule labs but had to leave a message for callback  ?

## 2022-03-22 ENCOUNTER — Encounter: Payer: Self-pay | Admitting: Internal Medicine

## 2022-03-22 DIAGNOSIS — E785 Hyperlipidemia, unspecified: Secondary | ICD-10-CM

## 2022-09-10 ENCOUNTER — Telehealth: Payer: Self-pay | Admitting: Internal Medicine

## 2022-09-10 NOTE — Telephone Encounter (Signed)
Call transferred directly to this RN from the call center.   I spoke with the patient's daughter, Lori [name]. She called today to report that for the past 2 days, the patient has had symptoms of: - Low BP (Lori [name] is not able to give me actual readings) - Low HR- 53 bpm - chest tightness - weakness - feelings of pre-syncope - body aches  Lori [name] advised that symptoms are not uncommon for the patient, but it is very unusual for this to go on for 2 days in a row. Chest tightness is not uncommon when the patient has episodes of low BP- usually relieved with NSAIDS, but not much relief over the las 2 days.   The patient is followed by [name] for dysautonomia. She was last seen by our clinic on 01/02/21 with Lori Montana, NP. She has not actually seen [name] since 11/09/19, but is scheduled to follow up with him on 10/17/22.   I have confirmed with Lori [name] that he patient is drinking fluids and taking in her normal amount of salt.  I have also confirmed no recent viral illness/ sickness. Lori [name] advised the patient does have compression, but only wears them when having symptoms typically. I have advised her to please have the patient put on her compression now and wear this today.   Lori [name] was requesting for [name] to see the patient. I advised [place] currently out of the [place], but did offer a DOD spot this afternoon. Per Lori [name], she is unable to bring the patient today due to her work schedule. They are requesting a morning appointment. I have offered tomorrow morning with [name] at 1040. Lori [name] would like to have the patient evaluated at that time.   I asked that they arrive 15 minutes early for check in. Lori [name] voices understanding and is agreeable. She is aware the appointment is in the [place] office.   Appt scheduled- Lori [name] has requested a Spanish interpreter for the appointment tomorrow.

## 2022-09-10 NOTE — Telephone Encounter (Signed)
STAT if HR is under 50 or over 120 (normal HR is 60-100 beats per minute)  What is your heart rate? Not sure what it is today  Do you have a log of your heart rate readings (document readings)? Yesterday 53  Do you have any other symptoms? Tired, body hurts   Pt c/o of Chest Pain: STAT if CP now or developed within 24 hours  1. Are you having CP right now? yes  2. Are you experiencing any other symptoms (ex. SOB, nausea, vomiting, sweating)? SOB, not currently with patient, sternum is swollen  3. How long have you been experiencing CP? 2 days, since first spell  4. Is your CP continuous or coming and going? continuous  5. Have you taken Nitroglycerin? No  She takes sodium pills to help her HR and BP and elevates her legs. She says the patient had 2 episodes of her HR dropping. She says it happened 2 days ago and yesterday. She has not passed out but she does get very weak. She says the patient has had chest pain since 2 days ago. She says she is getting ready to go see her now.

## 2022-09-11 ENCOUNTER — Other Ambulatory Visit
Admission: RE | Admit: 2022-09-11 | Discharge: 2022-09-11 | Disposition: A | Discharge status: Home / Self Care | Payer: Federal, State, Local not specified - PPO | Source: Ambulatory Visit | Attending: Internal Medicine | Admitting: Internal Medicine

## 2022-09-11 ENCOUNTER — Encounter: Payer: Self-pay | Admitting: Internal Medicine

## 2022-09-11 ENCOUNTER — Ambulatory Visit: Payer: Federal, State, Local not specified - PPO | Attending: Internal Medicine | Admitting: Internal Medicine

## 2022-09-11 VITALS — BP 128/84 | HR 66 | Ht 66.0 in | Wt 161.0 lb

## 2022-09-11 DIAGNOSIS — G901 Familial dysautonomia [Riley-Day]: Secondary | ICD-10-CM

## 2022-09-11 DIAGNOSIS — E782 Mixed hyperlipidemia: Secondary | ICD-10-CM

## 2022-09-11 DIAGNOSIS — R0781 Pleurodynia: Secondary | ICD-10-CM | POA: Insufficient documentation

## 2022-09-11 DIAGNOSIS — R079 Chest pain, unspecified: Secondary | ICD-10-CM | POA: Diagnosis not present

## 2022-09-11 LAB — COMPREHENSIVE METABOLIC PANEL
ALT: 19 U/L (ref 0–44)
AST: 17 U/L (ref 15–41)
Albumin: 4 g/dL (ref 3.5–5.0)
Alkaline Phosphatase: 71 U/L (ref 38–126)
Anion gap: 10 (ref 5–15)
BUN: 15 mg/dL (ref 6–20)
CO2: 25 mmol/L (ref 22–32)
Calcium: 9.3 mg/dL (ref 8.9–10.3)
Chloride: 107 mmol/L (ref 98–111)
Creatinine, Ser: 0.66 mg/dL (ref 0.44–1.00)
GFR, Estimated: >60 mL/min (ref >60)
Glucose, Bld: 105 mg/dL — ABNORMAL HIGH (ref 70–99)
Potassium: 4 mmol/L (ref 3.5–5.1)
Sodium: 142 mmol/L (ref 135–145)
Total Bilirubin: 0.6 mg/dL (ref 0.3–1.2)
Total Protein: 7.3 g/dL (ref 6.5–8.1)

## 2022-09-11 LAB — CBC WITH DIFFERENTIAL/PLATELET
Abs Immature Granulocytes: 0.02 10*3/uL (ref 0.00–0.07)
Basophils Absolute: 0 10*3/uL (ref 0.0–0.1)
Basophils Relative: 1 %
Eosinophils Absolute: 0.1 10*3/uL (ref 0.0–0.5)
Eosinophils Relative: 1 %
HCT: 42.9 % (ref 36.0–46.0)
Hemoglobin: 14.3 g/dL (ref 12.0–15.0)
Immature Granulocytes: 0 %
Lymphocytes Relative: 33 %
Lymphs Abs: 2.9 10*3/uL (ref 0.7–4.0)
MCH: 28.5 pg (ref 26.0–34.0)
MCHC: 33.3 g/dL (ref 30.0–36.0)
MCV: 85.6 fL (ref 80.0–100.0)
Monocytes Absolute: 0.5 10*3/uL (ref 0.1–1.0)
Monocytes Relative: 6 %
Neutro Abs: 5.2 10*3/uL (ref 1.7–7.7)
Neutrophils Relative %: 59 %
Platelets: 265 10*3/uL (ref 150–400)
RBC: 5.01 MIL/uL (ref 3.87–5.11)
RDW: 13 % (ref 11.5–15.5)
WBC: 8.8 10*3/uL (ref 4.0–10.5)
nRBC: 0 % (ref 0.0–0.2)

## 2022-09-11 LAB — SEDIMENTATION RATE: Sed Rate: 30 mm/hr (ref 0–30)

## 2022-09-11 MED ORDER — PRALUENT 75 MG/ML ~~LOC~~ SOAJ: 1.0000 "pen " | SUBCUTANEOUS | 11 refills | Status: DC

## 2022-09-11 MED ORDER — FUROSEMIDE 20 MG PO TABS
ORAL_TABLET | ORAL | 3 refills | Status: DC
Start: 2022-09-11 — End: 2022-12-09

## 2022-09-11 NOTE — Progress Notes (Signed)
Follow-up Outpatient Visit Date: 09/11/2022  Primary Care Provider: Perrin Maltese, MD [place] [address]  Primary Cardiologist: Lori Axe, MD  Chief Complaint: Chest pain, low heart rate, and low blood pressure  HPI:  Lori [name] is a 56 y.o. female with history of HFpEF, dysautonomia, PVCs, hyperlipidemia, asthma, and uterine cancer, who presents for urgent evaluation of low heart rates, weakness, lightheadedness, and chest pain.  She was last seen in our office in 12/2020 by Lori Legacy, NP, at which time she was feeling fairly well.  Lori [name] has multiple complaints today that began about 3 weeks ago while she was in [place].  She had GI symptoms including nausea and diarrhea that were reminiscent of what she experienced with her prior COVID-19 infection.  Over the last few days, she has experienced intermittent pain in her chest as well as apparent swelling overlying the lower part of her sternum.  The pain is described as a burning and tightness brought on by deep inspiration.  It reminds her of when she may have had pericarditis with prior COVID-19 infection.  Over the last few days, she has also had episodes of bradycardia and low blood pressures accompanied by fatigue and lightheadedness.  Her heart rate dipped as low as 53 bpm with blood pressure of 96/50.  She has not been taking furosemide; in fact she ran out of Praluent and furosemide about 4 months ago.  She has not had any palpitations or edema.  She has flown quite a bit over the last month though she notes the trips are typically short (less than 3 hours).  She does not have a history of DVT or PE.  --------------------------------------------------------------------------------------------------  Past Medical History:  Diagnosis Date   Asthma    Cancer, uterine ([place]) 1998   Chest pain    normal LV function by echo 10/2010   High cholesterol    Idioventricular rhythm (HCC)    Kidney stone     Palpitations    Past Surgical History:  Procedure Laterality Date   APPENDECTOMY     CESAREAN SECTION     x2   CHOLECYSTECTOMY     neurocardiogenic syncope     PATENT FORAMEN OVALE CLOSURE     SURGERY ON FACE     AFTER FALL   WRIST SURGERY     broken wrist,left    Current Meds  Medication Sig   albuterol (PROVENTIL HFA;VENTOLIN HFA) 108 (90 Base) MCG/ACT inhaler TOME DOS INHALACIONES CADA SEIS HORAS CUANDO SEA NECESARIO FOR WHEEZING/ SHORTNESS OF BREATH   Azelastine-Fluticasone 137-50 MCG/ACT SUSP Place 2 sprays into both nostrils daily as needed.   BIOTIN PO Take 1 tablet by mouth once a week.   Collagen Hydrolysate, Bovine, POWD daily.   loratadine (CLARITIN) 10 MG tablet Take 10 mg by mouth daily as needed.   Multiple Vitamin (MULTIVITAMIN) tablet Take 1 tablet by mouth daily.    Allergies: Atorvastatin, Ciprofloxacin hcl, Cyclobenzaprine, Latex, Levaquin [levofloxacin in d5w], Nortriptyline, Pravastatin, Prednisone, Sulfamethizole, and Repatha [evolocumab]  Social History   Tobacco Use   Smoking status: Former    Packs/day: 1.00    Years: 5.00    Total pack years: 5.00    Types: Cigarettes    Quit date: 02/11/2006    Years since quitting: 16.5   Smokeless tobacco: Never  Vaping Use   Vaping Use: Never used  Substance Use Topics   Alcohol use: No    Alcohol/week: 0.0 standard drinks of alcohol  Drug use: No    Family History  Problem Relation Age of Onset   Hypertension Mother    Heart disease Father 74       MI   Hyperlipidemia Father    Cancer Maternal Aunt    Uterine cancer Maternal Aunt    Breast cancer Neg Hx    Ovarian cancer Neg Hx    Colon cancer Neg Hx     Review of Systems: A 12-system review of systems was performed and was negative except as noted in the HPI.  --------------------------------------------------------------------------------------------------  Physical Exam: BP 128/84 (BP Location: Left Arm, Patient Position: Sitting,  Cuff Size: Normal)   Pulse 66   Ht _0  (1.676 m)   Wt 161 lb (73 kg)   LMP 11/19/2015   SpO2 99%   BMI 25.99 kg/m   General:  NAD.  Companied by her daughter. Neck: No JVD or HJR. Lungs: Clear to auscultation bilaterally without wheezes or crackles. Heart: Regular rate and rhythm without murmurs, rubs, or gallops. Chest (examined in the presence of Lori Picard-Tagnolli, RN, chaperone): No obvious deformity appreciated.  Significant tenderness noted across the left side of the chest with light palpation of the sternum and costochondral junction. Abdomen: Soft, nontender, nondistended. Extremities: No lower extremity edema.  EKG: Normal sinus rhythm with low voltage.  No significant change from prior tracing on 11/28/2020.  Lab Results  Component Value Date   WBC 7.7 11/28/2020   HGB 14.3 11/28/2020   HCT 43.9 11/28/2020   MCV 88.0 11/28/2020   PLT 241 11/28/2020    Lab Results  Component Value Date   NA 142 11/28/2020   K 4.6 11/28/2020   CL 105 11/28/2020   CO2 29 11/28/2020   BUN 17 11/28/2020   CREATININE 0.64 11/28/2020   GLUCOSE 106 (H) 11/28/2020   ALT 23 11/28/2020    Lab Results  Component Value Date   CHOL 198 05/30/2020   HDL 53 05/30/2020   LDLCALC 124 (H) 05/30/2020   TRIG 120 05/30/2020   CHOLHDL 3.7 05/30/2020    --------------------------------------------------------------------------------------------------  ASSESSMENT AND PLAN: Chest pain: Quality and location of pain with reproducibility is most consistent with costochondritis.  Ms. Lori [name] is quite concerned that it could reflect recurrent pericarditis though I do not see EKG changes or appreciate a friction rub on exam today.  Her blood pressure and heart rate are also normal.  Though she is allergic to naproxen, she has previously tolerated ibuprofen well and actually has a prescription for ibuprofen 800 mg daily that she recently filled but has not taken yet.  I have recommended ibuprofen  800 mg 3 times daily for 1 week to see if her symptoms improve.  If pain worsens, I advised her to seek immediate medical attention.  Suspicion for for PE and ACS are low given limited risk factors.  She has flown quite a bit but her flights were never more than 3 hours in length.  She also does not have leg swelling or history of VTE.  I will check a CBC, CMP, and ESR today, given that she is overdue for her routine labs as well as to screen for underlying inflammatory process.  If symptoms do not improve when she is seen for follow-up, echocardiogram +/- ischemia evaluation will need to be considered.  Dysautonomia: Heart rate and blood pressure normal today with negative orthostatic vital signs.  No further work-up at this time.  Hyperlipidemia: Refill for Praluent provided today.  I will check  a CMP today.  Ongoing management per [name].  Follow-up: Return to clinic in 1-2 weeks with [name] or APP.  Nelva Bush, MD 09/11/2022 11:07 AM

## 2022-09-11 NOTE — Patient Instructions (Signed)
Medication Instructions:   Your physician has recommended you make the following change in your medication:   Tome ibuprofeno 800 MG tres veces al [employer].  *If you need a refill on your cardiac medications before your next appointment, please call your pharmacy*   Lab Work:  [place] despus de su cita de hoy para los siguientes analisis: conteo sanguineo completo, tasa de sedimentacin y un panel metabolico completo.    Follow-Up: At [facility], you and your health needs are our priority.  As part of our continuing mission to provide you with exceptional heart care, we have created designated Provider Care Teams.  These Care Teams include your primary Cardiologist (physician) and Advanced Practice Providers (APPs -  Physician Assistants and Nurse Practitioners) who all work together to provide you with the care you need, when you need it.  We recommend signing up for the patient portal called "MyChart".  Sign up information is provided on this After Visit Summary.  MyChart is used to connect with patients for Virtual Visits (Telemedicine).  Patients are able to view lab/test results, encounter notes, upcoming appointments, etc.  Non-urgent messages can be sent to your provider as well.   To learn more about what you can do with MyChart, go to [email or web address].    Your next appointment:   1-2 week(s)  The format for your next appointment:   In Person  Provider:   You may see Jolyn Nap, MD or one of the following Advanced Practice Providers on your designated Care Team:   Murray Hodgkins, NP Christell Faith, PA-C Cadence Kathlen Mody, PA-C Gerrie Nordmann, NP    Other Instructions   Important Information About Sugar

## 2022-09-13 ENCOUNTER — Telehealth: Payer: Self-pay | Admitting: Internal Medicine

## 2022-09-13 DIAGNOSIS — E782 Mixed hyperlipidemia: Secondary | ICD-10-CM

## 2022-09-13 NOTE — Telephone Encounter (Signed)
I will defer to [name], as he is the one who initially prescribed the medication (the patient asked for a refill at her urgent visit with me this week because she had run out a couple of months ago).  Nelva Bush, MD [facility]

## 2022-09-13 NOTE — Telephone Encounter (Signed)
Pt c/o medication issue:  1. Name of Medication:   Alirocumab (Maish Vaya) 75 MG/ML SOAJ    2. How are you currently taking this medication (dosage and times per day)? Inject 1 pen into the skin every 14 (fourteen) days.   3. Are you having a reaction (difficulty breathing--STAT)? no  4. What is your medication issue? Pharmacy states the insurance needs a letter saying that the medication is medical necessities. So that the insurance can cover majority of it. Please advise

## 2022-09-16 NOTE — Telephone Encounter (Signed)
I called and spoke with the patient and her son.  I advised her of the need for a prior authorization to be done, but she will need an updated lipid panel prior to this being done.   The patient is aware that she may go to the [place] [facility] at her convenience to have this done. She is aware of the lab location as she had lab work done there last week.   She is aware she will need to fast for 8 hours prior to the lab draw. She is aware that once her lab work is completed, a PA can be attempted.  The patient verbalizes understanding of all of the above and is agreeable.

## 2022-09-16 NOTE — Telephone Encounter (Signed)
We would need to complete a prior authorization. However it will not be accepted without a current lipid panel. Recommend updating fasting lipid panel and then we can update prior authorization.

## 2022-09-16 NOTE — Telephone Encounter (Signed)
To Pharm D for review.  The patient has not seen [name] since 11/09/19. She saw Oda Kilts, PA on 01/10/20, who then referred her to the [facility]. She was seen by the [facility] team on 02/21/20 and Praluent was initiated at that time.   She saw [name] last week as a DOD add on for symptoms she was having.   I don't see that the patient has had a lipid panel checked since 05/30/20.  She has an upcoming appointment on 09/26/22 with [name].

## 2022-09-17 ENCOUNTER — Encounter: Payer: Self-pay | Admitting: *Deleted

## 2022-09-26 ENCOUNTER — Ambulatory Visit: Payer: Federal, State, Local not specified - PPO | Attending: Internal Medicine | Admitting: Internal Medicine

## 2022-09-26 ENCOUNTER — Encounter: Payer: Self-pay | Admitting: Internal Medicine

## 2022-09-26 VITALS — BP 110/64 | HR 70 | Ht 66.0 in | Wt 162.5 lb

## 2022-09-26 DIAGNOSIS — I5032 Chronic diastolic (congestive) heart failure: Secondary | ICD-10-CM

## 2022-09-26 DIAGNOSIS — E782 Mixed hyperlipidemia: Secondary | ICD-10-CM | POA: Diagnosis not present

## 2022-09-26 DIAGNOSIS — R002 Palpitations: Secondary | ICD-10-CM | POA: Diagnosis not present

## 2022-09-26 DIAGNOSIS — G901 Familial dysautonomia [Riley-Day]: Secondary | ICD-10-CM | POA: Diagnosis not present

## 2022-09-26 DIAGNOSIS — E785 Hyperlipidemia, unspecified: Secondary | ICD-10-CM | POA: Diagnosis not present

## 2022-09-26 NOTE — Progress Notes (Signed)
Patient Care Team: Perrin Maltese, MD as PCP - General (Internal Medicine) Deboraha Sprang, MD as PCP - Electrophysiology (Cardiology)   HPI  Lori [name] is a 56 y.o. female Seen in followup for palpitations in the context of what I thought on evaluation December 2013 was dysautonomia.  Last seen 11/21 has seen Dr. Bethann Humble intercurrently for chest pain.  Has a history apparently of pericarditis but was felt to have just costochondritis.  Repeat sed rate was normal [facility], we do not have that testing available.  This is affected her shortness of breath, otherwise her breathing has been really very good.  Some orthostatic lightheadedness diet has been replete of fluid and sodium in general.  She is separated from her husband.  This is helped her stress.  She is gotten a degree in psychology and is now working.     DATE TEST EF   8/13 Echo   60-65 % Mild BAE  9/20 Echo   60-65 %   2/22 Echo  60-65%     Date Cr K Hgb  11/23 0.66 4.0 14.3           Past Medical History:  Diagnosis Date   Asthma    Cancer, uterine ([place]) 1998   Chest pain    normal LV function by echo 10/2010   High cholesterol    Idioventricular rhythm (HCC)    Kidney stone    Palpitations     Past Surgical History:  Procedure Laterality Date   APPENDECTOMY     CESAREAN SECTION     x2   CHOLECYSTECTOMY     neurocardiogenic syncope     PATENT FORAMEN OVALE CLOSURE     SURGERY ON FACE     AFTER FALL   WRIST SURGERY     broken wrist,left    Current Outpatient Medications  Medication Sig Dispense Refill   albuterol (PROVENTIL HFA;VENTOLIN HFA) 108 (90 Base) MCG/ACT inhaler TOME DOS INHALACIONES CADA SEIS HORAS CUANDO SEA NECESARIO FOR WHEEZING/ SHORTNESS OF BREATH 6.7 Inhaler 0   Alirocumab (PRALUENT) 75 MG/ML SOAJ Inject 1 pen  into the skin every 14 (fourteen) days. 2 mL 11   Azelastine-Fluticasone 137-50 MCG/ACT SUSP Place 2 sprays into both nostrils  daily as needed.     BIOTIN PO Take 1 tablet by mouth once a week.     Collagen Hydrolysate, Bovine, POWD daily.     furosemide (LASIX) 20 MG tablet TAKE 1 TABLET (20 MG TOTAL) BY MOUTH DAILY AS NEEDED. 30 tablet 3   loratadine (CLARITIN) 10 MG tablet Take 10 mg by mouth daily as needed.     Multiple Vitamin (MULTIVITAMIN) tablet Take 1 tablet by mouth daily.     No current facility-administered medications for this visit.    Allergies  Allergen Reactions   Atorvastatin Swelling   Ciprofloxacin Hcl Other (See Comments)    Heart condition  Lowers blood pressure   Cyclobenzaprine Other (See Comments)    Angina; lowers BP   Latex    Levaquin [Levofloxacin In D5w]     Tremors, nausea   Nortriptyline Other (See Comments)    Angina, lowers BP   Pravastatin Other (See Comments)    gingivitis   Prednisone Other (See Comments)    Has heart condition causes BP to gt too low.   Sulfamethizole Hives   Repatha [Evolocumab] Other (See Comments)    myalgias   Naproxen Rash  Review of Systems negative except from HPI and PMH  Physical Exam BP 110/64 (BP Location: Left Arm, Patient Position: Sitting, Cuff Size: Normal)   Pulse 70   Ht '5\' 6"'$  (1.676 m)   Wt 162 lb 8 oz (73.7 kg)   LMP 11/19/2015   SpO2 98%   BMI 26.23 kg/m  YesWell developed and nourished in no acute distress HENT normal Neck supple  Clear Regular rate and rhythm, no murmurs or gallops Abd-soft with active BS No Clubbing cyanosis edema Skin-warm and dry A & Oriented  Grossly normal sensory and motor function  ECG sinus at 67 15/07/38   Assessment and  Plan  Dysautonomia  Palpitations  anxiety  COVID-recurrent  Hypercholesterolemia  Interval COVID infection affecting dyspnea chest pain as well as some of her dysautonomia.  Somewhat better.  Salt and water repletion is good.  Managing her stress which included separating from her husband.  Hypercholesterolemia was previously on Praluent.  She  needs a fasting lipid profile prior to that being able to resume that drug.  We will arrange.   Euvolemic.  Overall doing as well as I have seen her in some time.  No changes.

## 2022-09-26 NOTE — Patient Instructions (Signed)
Medication Instructions:  - Your physician recommends that you continue on your current medications as directed. Please refer to the Current Medication list given to you today.  *If you need a refill on your cardiac medications before your next appointment, please call your pharmacy*   Lab Work: - Your physician recommends that you return for FASTING lab work at your convenience:   Lipid panel  Do not have anything to eat/ drink for 8 hours prior to your lab draw, except for water or black coffee are fine  [facility] Entrance at [facility] desk on the right to check in (REGISTRATION)  Lab hours: Monday- Friday (7:30 am- 5:30 pm)   If you have labs (blood work) drawn today and your tests are completely normal, you will receive your results only by: MyChart Message (if you have MyChart) OR A paper copy in the mail If you have any lab test that is abnormal or we need to change your treatment, we will call you to review the results.   Testing/Procedures: -  none ordered   Follow-Up: At [facility], you and your health needs are our priority.  As part of our continuing mission to provide you with exceptional heart care, we have created designated Provider Care Teams.  These Care Teams include your primary Cardiologist (physician) and Advanced Practice Providers (APPs -  Physician Assistants and Nurse Practitioners) who all work together to provide you with the care you need, when you need it.  We recommend signing up for the patient portal called "MyChart".  Sign up information is provided on this After Visit Summary.  MyChart is used to connect with patients for Virtual Visits (Telemedicine).  Patients are able to view lab/test results, encounter notes, upcoming appointments, etc.  Non-urgent messages can be sent to your provider as well.   To learn more about what you can do with MyChart, go to [email or web address].    Your next appointment:   1 year(s)  The format for  your next appointment:   In Person  Provider:   Virl Axe, MD    Other Instructions N/a  Important Information About Sugar

## 2022-09-30 NOTE — Telephone Encounter (Signed)
Reviewed the patient's chart.  She did not go to have her lipid profile done. She was seen in office on 09/26/22 with [name] and advised again via an interpreter that she will need to have a FASTING Lipid panel done, so her PA can be started for her Praluent.  She has still not completed this as of today, but was instructed to come back FASTING at her convenience for labs at the [facility].  Will await lipid panel results when the patient has this completed.

## 2022-10-05 ENCOUNTER — Encounter: Payer: Self-pay | Admitting: Internal Medicine

## 2022-10-07 NOTE — Telephone Encounter (Signed)
Cannot find labs. I faxed request to PCP office to send.

## 2022-10-08 ENCOUNTER — Encounter: Payer: Self-pay | Admitting: Internal Medicine

## 2022-10-09 NOTE — Telephone Encounter (Signed)
Received labs. Called [facility] to do PA. They will fax me non-formulary form.

## 2022-10-11 ENCOUNTER — Telehealth: Payer: Self-pay | Admitting: Internal Medicine

## 2022-10-11 NOTE — Telephone Encounter (Signed)
Pt's daughter called stating pt's is c/o of chest pressure and burning sensation since last night. She reports pt is dizzy, nauseated, and also experiencing pain in the front and back of left shoulder.  Daughter reports EMS was contacted last night. She report vitals and EKG was normal, so pt refused to be transported. She reports pt is currently experiencing symptoms.   Based on current symptoms, nurse recommended pt report to ER for further evaluations.  Daughter verbalized understanding.

## 2022-10-11 NOTE — Telephone Encounter (Signed)
Pt c/o of Chest Pain: STAT if CP now or developed within 24 hours  1. Are you having CP right now? yes  2. Are you experiencing any other symptoms (ex. SOB, nausea, vomiting, sweating)? Nausea, dizzy  3. How long have you been experiencing CP? Last night  4. Is your CP continuous or coming and going? Comes and goes  5. Have you taken Nitroglycerin? No  ?   Patient's daughter says the patient has been having chest pressure and burning. She says the patient also has pain in the front and back of her shoulder. She says she also has also been dizzy and nauseas. She states last night the called 911 and the EMS did an EKG and it was normal. She says her vitals were also normal.

## 2022-10-14 NOTE — Telephone Encounter (Signed)
PA questions submitted via fax

## 2022-10-17 ENCOUNTER — Ambulatory Visit: Payer: Federal, State, Local not specified - PPO | Admitting: Internal Medicine

## 2022-10-17 MED ORDER — PRALUENT 75 MG/ML ~~LOC~~ SOAJ: 1.0000 "pen " | SUBCUTANEOUS | 11 refills | Status: DC

## 2022-10-17 NOTE — Telephone Encounter (Signed)
PA approved through 10/16/23.

## 2022-12-09 ENCOUNTER — Other Ambulatory Visit: Payer: Self-pay | Admitting: Internal Medicine

## 2023-01-31 ENCOUNTER — Encounter: Payer: Self-pay | Admitting: Internal Medicine

## 2023-03-18 ENCOUNTER — Other Ambulatory Visit: Payer: Self-pay | Admitting: Internal Medicine

## 2023-05-12 ENCOUNTER — Telehealth: Payer: Self-pay | Admitting: Internal Medicine

## 2023-05-12 NOTE — Telephone Encounter (Signed)
Spoke with patient via Spanish interpreter [phone]).  Pt went shopping yesterday and had to park far from the store. Felt very warm, SOB, and lightheaded walking back to car. She turned on the A/C and drank a bottle of water with salt, then started driving to her daughter's house. Reports tachycardia and chest pressure started a few hours later. Says it felt like a "crump" in her heart. Passed out for 10-15 minutes while lying down in her bed. Family member put salt in her mouth and she woke up shortly after. BP was 117/62 approximately 10 minutes after she fainted.  Feeling very tired and weak today. She states that she has some chest soreness when she takes a deep breath. Denies taking PRN furosemide within the past few days. States she has plenty of fluids and sodium today.  Discussed with Dr. Royann Shivers for recommendations. He recommended pt stay indoors to avoid the heat and encouraged adequate hydration and sodium intake. Advised pt may continue to feel weak for the next couple of days. Dr. Royann Shivers also advised that pt's chest soreness does not sound cardiac in nature. Relayed information to patient. ED precautions given for new or worsening symptoms. Pt verbalized understanding of all instructions.  Pt requesting assistance with renewing her handicap placard. She states it expired several months ago. Explained will forward message to Dr. Graciela Husbands and Mindi Junker, RN, for advisement. Pt in agreement with plan.

## 2023-05-12 NOTE — Telephone Encounter (Signed)
Pt spouse called in stating pt has been fainting frequently the last couple of weeks. He also stated pt feels like its burning around her heart. Please advise.

## 2023-05-13 NOTE — Telephone Encounter (Signed)
Handicap Placard completed and mailed to pt's address on file in Epic.

## 2023-08-10 ENCOUNTER — Other Ambulatory Visit: Payer: Self-pay | Admitting: Internal Medicine

## 2023-08-18 ENCOUNTER — Ambulatory Visit: Payer: Self-pay | Admitting: Family

## 2023-08-18 ENCOUNTER — Encounter: Payer: Self-pay | Admitting: Family

## 2023-08-18 VITALS — BP 125/80 | HR 66 | Ht 66.0 in | Wt 160.0 lb

## 2023-08-18 DIAGNOSIS — I739 Peripheral vascular disease, unspecified: Secondary | ICD-10-CM

## 2023-08-18 DIAGNOSIS — E782 Mixed hyperlipidemia: Secondary | ICD-10-CM | POA: Diagnosis not present

## 2023-08-18 DIAGNOSIS — R7303 Prediabetes: Secondary | ICD-10-CM

## 2023-08-18 DIAGNOSIS — I5032 Chronic diastolic (congestive) heart failure: Secondary | ICD-10-CM

## 2023-08-18 DIAGNOSIS — G901 Familial dysautonomia [Riley-Day]: Secondary | ICD-10-CM

## 2023-08-18 NOTE — Patient Instructions (Signed)
RepHresh gel  - will be in section with tampons/pads.   MagOx tablets for magnesium.

## 2023-08-19 ENCOUNTER — Ambulatory Visit (INDEPENDENT_AMBULATORY_CARE_PROVIDER_SITE_OTHER): Payer: Self-pay | Admitting: Family

## 2023-08-19 DIAGNOSIS — M545 Low back pain, unspecified: Secondary | ICD-10-CM

## 2023-08-19 LAB — POCT URINALYSIS DIPSTICK
Bilirubin, UA: NEGATIVE
Glucose, UA: NEGATIVE
Ketones, UA: NEGATIVE
Leukocytes, UA: NEGATIVE
Nitrite, UA: NEGATIVE
Protein, UA: NEGATIVE
Spec Grav, UA: 1.025 (ref 1.010–1.025)
Urobilinogen, UA: 0.2 U/dL
pH, UA: 5.5 (ref 5.0–8.0)

## 2023-08-19 MED ORDER — NITROFURANTOIN MONOHYD MACRO 100 MG PO CAPS: 100.0000 mg | ORAL_CAPSULE | Freq: Two times a day (BID) | ORAL | 0 refills | Status: DC

## 2023-08-19 NOTE — Progress Notes (Signed)
   CHIEF COMPLAINT  UA/ only visit fot UTI     REASON FOR VISIT  Possible UTI, UA Visit Only      ASSESSMENT & PLAN Diagnoses and all orders for this visit:  Low back pain, unspecified back pain laterality, unspecified chronicity, unspecified whether sciatica present -     POCT urinalysis dipstick  Other orders -     nitrofurantoin, macrocrystal-monohydrate, (MACROBID) 100 MG capsule; Take 1 capsule (100 mg total) by mouth 2 (two) times daily.     Patient notified.  Total time spent: 10 minutes  Miki Kins, FNP 08/19/2023

## 2023-08-20 LAB — URINALYSIS, ROUTINE W REFLEX MICROSCOPIC
Bilirubin, UA: NEGATIVE
Glucose, UA: NEGATIVE
Ketones, UA: NEGATIVE
Leukocytes,UA: NEGATIVE
Nitrite, UA: NEGATIVE
RBC, UA: NEGATIVE
Specific Gravity, UA: 1.025 (ref 1.005–1.030)
Urobilinogen, Ur: 0.2 mg/dL (ref 0.2–1.0)
pH, UA: 5.5 (ref 5.0–7.5)

## 2023-08-21 LAB — URINE CULTURE

## 2023-08-22 ENCOUNTER — Encounter: Payer: Self-pay | Admitting: Family

## 2023-08-27 MED ORDER — FLUCONAZOLE 150 MG PO TABS: 150.0000 mg | ORAL_TABLET | Freq: Every day | ORAL | 0 refills | Status: DC

## 2023-09-08 DIAGNOSIS — G9589 Other specified diseases of spinal cord: Secondary | ICD-10-CM | POA: Insufficient documentation

## 2023-09-08 DIAGNOSIS — S161XXA Strain of muscle, fascia and tendon at neck level, initial encounter: Secondary | ICD-10-CM | POA: Insufficient documentation

## 2023-09-08 HISTORY — DX: Other specified diseases of spinal cord: G95.89

## 2023-09-08 HISTORY — DX: Strain of muscle, fascia and tendon at neck level, initial encounter: S16.1XXA

## 2023-09-09 ENCOUNTER — Other Ambulatory Visit (INDEPENDENT_AMBULATORY_CARE_PROVIDER_SITE_OTHER): Payer: Self-pay | Admitting: Nurse Practitioner

## 2023-09-09 DIAGNOSIS — I739 Peripheral vascular disease, unspecified: Secondary | ICD-10-CM

## 2023-09-12 ENCOUNTER — Encounter (INDEPENDENT_AMBULATORY_CARE_PROVIDER_SITE_OTHER): Payer: Federal, State, Local not specified - PPO

## 2023-09-12 ENCOUNTER — Encounter (INDEPENDENT_AMBULATORY_CARE_PROVIDER_SITE_OTHER): Payer: Federal, State, Local not specified - PPO | Admitting: Nurse Practitioner

## 2023-09-16 ENCOUNTER — Other Ambulatory Visit: Payer: Self-pay | Admitting: Family

## 2023-09-16 ENCOUNTER — Telehealth: Payer: Self-pay

## 2023-09-16 ENCOUNTER — Other Ambulatory Visit: Payer: Self-pay

## 2023-09-16 DIAGNOSIS — J45998 Other asthma: Secondary | ICD-10-CM

## 2023-09-16 NOTE — Telephone Encounter (Signed)
Wants refill for inhaler.

## 2023-09-18 ENCOUNTER — Ambulatory Visit: Payer: Federal, State, Local not specified - PPO | Admitting: Family

## 2023-09-22 ENCOUNTER — Ambulatory Visit (INDEPENDENT_AMBULATORY_CARE_PROVIDER_SITE_OTHER): Payer: Federal, State, Local not specified - PPO | Admitting: Family

## 2023-09-22 ENCOUNTER — Ambulatory Visit
Admission: RE | Admit: 2023-09-22 | Discharge: 2023-09-22 | Disposition: A | Discharge status: Home / Self Care | Payer: Federal, State, Local not specified - PPO | Source: Ambulatory Visit | Attending: Family | Admitting: Family

## 2023-09-22 ENCOUNTER — Ambulatory Visit
Admission: RE | Admit: 2023-09-22 | Discharge: 2023-09-22 | Disposition: A | Discharge status: Home / Self Care | Payer: Federal, State, Local not specified - PPO | Attending: Family | Admitting: Family

## 2023-09-22 DIAGNOSIS — R0600 Dyspnea, unspecified: Secondary | ICD-10-CM | POA: Insufficient documentation

## 2023-09-22 DIAGNOSIS — J45998 Other asthma: Secondary | ICD-10-CM

## 2023-09-22 DIAGNOSIS — J069 Acute upper respiratory infection, unspecified: Secondary | ICD-10-CM

## 2023-09-22 LAB — POCT XPERT XPRESS SARS COVID-2/FLU/RSV
FLU A: NEGATIVE
FLU B: NEGATIVE
RSV RNA, PCR: NEGATIVE
SARS Coronavirus 2: NEGATIVE

## 2023-09-22 MED ORDER — FLUCONAZOLE 150 MG PO TABS: 150.0000 mg | ORAL_TABLET | ORAL | 0 refills | Status: AC

## 2023-09-22 MED ORDER — ALBUTEROL SULFATE HFA 108 (90 BASE) MCG/ACT IN AERS: 1.0000 | INHALATION_SPRAY | RESPIRATORY_TRACT | 5 refills | Status: DC | PRN

## 2023-09-22 MED ORDER — AMOXICILLIN-POT CLAVULANATE 875-125 MG PO TABS: 1.0000 | ORAL_TABLET | Freq: Two times a day (BID) | ORAL | 0 refills | Status: DC

## 2023-09-22 NOTE — Progress Notes (Signed)
   Subjective   CHIEF COMPLAINT  COVID Testing    REASON FOR VISIT  COVID testing for URI symptoms.        Objective   No results found for any visits on 09/22/23.  Assessment & Plan  1. Dyspnea, unspecified type CXR today Will send antibiotics and inhaler. - DG Chest 2 View; Future  2. Other asthma  - albuterol (VENTOLIN HFA) 108 (90 Base) MCG/ACT inhaler; Inhale 1-2 puffs into the lungs every 4 (four) hours as needed for wheezing or shortness of breath.  Dispense: 8.5 each; Refill: 5   Total time spent: 10 minutes  AMA-NURSE 09/22/2023

## 2023-10-12 ENCOUNTER — Encounter: Payer: Self-pay | Admitting: Family

## 2023-10-12 NOTE — Assessment & Plan Note (Signed)
Checking labs today.  Continue current therapy for lipid control. Will modify as needed based on labwork results.  

## 2023-10-12 NOTE — Assessment & Plan Note (Signed)
Will set up with V/V for referral.  Checking labs today as well.   Will call with results.

## 2023-10-12 NOTE — Progress Notes (Signed)
Acute Office Visit  Subjective:     Patient ID: Lori [name], female    DOB: 10/03/1966, 57 y.o.   MRN: [id number]  Patient is in today for  Chief Complaint  Patient presents with   Follow-up    Pain in both legs    Patient here today with c/o pain in iher legs.  She has been having this on and off, happens regardless of time of day.  Will frequently happen while she is walking.   Also concerned she might have a URI,      ROS      Objective:    BP 125/80   Pulse 66   Ht 5\' 6"  (1.676 m)   Wt 160 lb (72.6 kg)   LMP 11/19/2015   SpO2 98%   BMI 25.82 kg/m   Physical Exam  No results found for any visits on 08/18/23.  Recent Results (from the past 2160 hours)  POCT urinalysis dipstick     Status: None   Collection Time: 08/19/23  2:09 PM  Result Value Ref Range   Color, UA     Clarity, UA     Glucose, UA Negative Negative   Bilirubin, UA Negative    Ketones, UA Negative    Spec Grav, UA 1.025 1.010 - 1.025   Blood, UA Trace    pH, UA 5.5 5.0 - 8.0   Protein, UA Negative Negative   Urobilinogen, UA 0.2 0.2 or 1.0 E.U./dL   Nitrite, UA Negative    Leukocytes, UA Negative Negative   Appearance     Odor    Urinalysis, Routine w reflex microscopic     Status: None   Collection Time: 08/19/23  4:29 PM  Result Value Ref Range   Specific Gravity, UA 1.025 1.005 - 1.030   pH, UA 5.5 5.0 - 7.5   Color, UA Yellow Yellow   Appearance Ur Clear Clear   Leukocytes,UA Negative Negative   Protein,UA Trace Negative/Trace   Glucose, UA Negative Negative   Ketones, UA Negative Negative   RBC, UA Negative Negative   Bilirubin, UA Negative Negative   Urobilinogen, Ur 0.2 0.2 - 1.0 mg/dL   Nitrite, UA Negative Negative   Microscopic Examination Comment     Comment: Microscopic not indicated and not performed.  Urine Culture     Status: None   Collection Time: 08/19/23  4:31 PM   Specimen: Urine, Clean Catch   CU  Result Value Ref Range   Urine Culture, Routine  Final report    Organism ID, Bacteria Comment     Comment: Mixed urogenital flora 10,000-25,000 colony forming units per mL   POCT XPERT XPRESS SARS COVID-2/FLU/RSV     Status: Normal   Collection Time: 09/22/23  3:03 PM  Result Value Ref Range   SARS Coronavirus 2 Negative    FLU A Negative    FLU B Negative    RSV RNA, PCR Negative     Allergies as of 08/18/2023       Reactions   Atorvastatin Swelling   Ciprofloxacin Hcl Other (See Comments)   Heart condition  Lowers blood pressure   Cyclobenzaprine Other (See Comments)   Angina; lowers BP   Latex    Levaquin [levofloxacin In D5w]    Tremors, nausea   Nortriptyline Other (See Comments)   Angina, lowers BP   Pravastatin Other (See Comments)   gingivitis   Prednisone Other (See Comments)   Has heart condition causes BP to gt  too low.   Sulfamethizole Hives   Repatha [evolocumab] Other (See Comments)   myalgias   Naproxen Rash        Medication List        Accurate as of August 18, 2023 11:59 PM. If you have any questions, ask your nurse or doctor.          albuterol 108 (90 Base) MCG/ACT inhaler Commonly known as: VENTOLIN HFA TOME DOS INHALACIONES CADA SEIS HORAS CUANDO SEA NECESARIO FOR WHEEZING/ SHORTNESS OF BREATH   Azelastine-Fluticasone 137-50 MCG/ACT Susp Place 2 sprays into both nostrils daily as needed.   BIOTIN PO Take 1 tablet by mouth once a week.   Collagen Hydrolysate (Bovine) Powd daily.   furosemide 20 MG tablet Commonly known as: LASIX TAKE 1 TABLET BY MOUTH EVERY DAY AS NEEDED   loratadine 10 MG tablet Commonly known as: CLARITIN Take 10 mg by mouth daily as needed.   multivitamin tablet Take 1 tablet by mouth daily.   Praluent 75 MG/ML Soaj Generic drug: Alirocumab Inject 1 pen  into the skin every 14 (fourteen) days.            Assessment & Plan:   Problem List Items Addressed This Visit       Active Problems   Dysautonomia [facility])   Patient is seen by  Cardiology, who manage this condition.  She is well controlled with current therapy.   Will defer to them for further changes to plan of care.       Mixed hyperlipidemia   Checking labs today.  Continue current therapy for lipid control. Will modify as needed based on labwork results.        Relevant Orders   Lipid Profile   Peripheral vascular disease (HCC) - Primary   Will set up with V/V for referral.  Checking labs today as well.   Will call with results.        Relevant Orders   Ambulatory referral to Vascular Surgery   Chronic heart failure with preserved ejection fraction [facility])   Patient is seen by Cardiology, who manage this condition.  She is well controlled with current therapy.   Will defer to them for further changes to plan of care.       Other Visit Diagnoses       Intermittent claudication (HCC)       setting up for referral to Vein/Vascular.  Will defer to them for any further changes/treatment.   Relevant Orders   CMP14+EGFR   CBC with Diff   Magnesium   Ambulatory referral to Vascular Surgery     Prediabetes       Relevant Orders   Hemoglobin A1c        Return in about 1 month (around 09/18/2023).  Total time spent: 20 minutes  Miki Kins, FNP  08/18/2023   This document may have been prepared by [facility] Voice Recognition software and as such may include unintentional dictation errors.

## 2023-10-12 NOTE — Assessment & Plan Note (Signed)
Patient is seen by Cardiology, who manage this condition.  She is well controlled with current therapy.   Will defer to them for further changes to plan of care.

## 2023-10-14 ENCOUNTER — Encounter (INDEPENDENT_AMBULATORY_CARE_PROVIDER_SITE_OTHER): Payer: Federal, State, Local not specified - PPO | Admitting: Nurse Practitioner

## 2023-10-14 ENCOUNTER — Encounter (INDEPENDENT_AMBULATORY_CARE_PROVIDER_SITE_OTHER): Payer: Federal, State, Local not specified - PPO

## 2023-10-27 ENCOUNTER — Other Ambulatory Visit (HOSPITAL_COMMUNITY): Payer: Self-pay

## 2023-10-27 NOTE — Telephone Encounter (Signed)
error 

## 2023-11-24 ENCOUNTER — Other Ambulatory Visit: Payer: Self-pay | Admitting: Internal Medicine

## 2023-11-27 NOTE — Telephone Encounter (Signed)
Called patient with lang line - patient wished to schedule then dropped call Will call back

## 2024-02-12 ENCOUNTER — Other Ambulatory Visit: Payer: Self-pay | Admitting: Family

## 2024-02-26 NOTE — Progress Notes (Deleted)
 Electrophysiology Clinic Note    Date:  02/26/2024  Patient ID:  Lori [name], Lori [name] 1966-02-09, MRN [id number] PCP:  Trenda Frisk, FNP  Cardiologist:  Sammy Crisp, MD Electrophysiologist: Richardo Chandler, MD  ***refresh  Discussed the use of AI scribe software for clinical note transcription with the patient, who gave verbal consent to proceed.   Patient Profile    Chief Complaint: dysautonomia follow-up  History of Present Illness: Lori [name] is a 58 y.o. female with PMH notable for dysautonomia, HFpEF; seen today for Richardo Chandler, MD for routine electrophysiology followup.   She last saw Dr. Rodolfo Clan 08/2022 at which time she was doing well without significant orthostatic s/s.  On follow-up today,  *** salt, water *** compression      Since last being seen in our clinic the patient reports doing ***.  she denies chest pain, palpitations, dyspnea, PND, orthopnea, nausea, vomiting, dizziness, syncope, edema, weight gain, or early satiety.      Arrhythmia/Device History No specialty comments available.     ROS:  Please see the history of present illness. All other systems are reviewed and otherwise negative.    Physical Exam    VS:  LMP 11/19/2015  BMI: There is no height or weight on file to calculate BMI.  No data found.  Wt Readings from Last 3 Encounters:  08/18/23 160 lb (72.6 kg)  09/26/22 162 lb 8 oz (73.7 kg)  09/11/22 161 lb (73 kg)     GEN- The patient is well appearing, alert and oriented x 3 today.   Lungs- Clear to ausculation bilaterally, normal work of breathing.  Heart- {Blank single:19197::"Regular","Irregularly irregular"} rate and rhythm, no murmurs, rubs or gallops Extremities- {EDEMA LEVEL:28147::"No"} peripheral edema, warm, dry    Studies Reviewed   Previous EP, cardiology notes.    EKG is ordered. Personal review of EKG from today shows:  ***        TTE, 12/14/2020  1. Left ventricular ejection fraction, by estimation,  is 60 to 65%. The left ventricle has normal function. The left ventricle has no regional wall motion abnormalities. Left ventricular diastolic parameters were normal.   2. Right ventricular systolic function is normal. The right ventricular size is normal. There is normal pulmonary artery systolic pressure. The estimated right ventricular systolic pressure is 23.8 mmHg.   3. The mitral valve is normal in structure. Mild mitral valve regurgitation.    Assessment and Plan     #) dysautonomia #) palpitations  We discussed the role of salt and water repletion, the importance of exercise, often needing to be started in the recumbent position, and the awareness of triggers and the role of ambient heat and dehydration.  Discussed salt substitutes with a  goal of 2-3 gms of Sodium or 5-7 gm of sodium Chloride  daily.  Rehydration solutions include Liquid IV, NUUN, TriOral, Normalyte pedialyte advanced care and Banana Bags.  Salt tablets include plain salt tablets, SaltStick Vitassium, thermatabs amongst others.   Salt supplements are best used with adjunctive sugar   #) ***   {Are you ordering a CV Procedure (e.g. stress test, cath, DCCV, TEE, etc)?   Press F2        :[id number]}   Current medicines are reviewed at length with the patient today.   The patient {ACTIONS; HAS/DOES NOT HAVE:19233} concerns regarding her medicines.  The following changes were made today:  {NONE DEFAULTED:18576}  Labs/ tests ordered today include: *** No orders of the defined types were  placed in this encounter.    Disposition: Follow up with {EPMDS:28135} or EP APP {EPFOLLOW UP:28173}   Signed, Adaline Holly, NP  02/26/24  8:23 PM  Electrophysiology [facility]

## 2024-02-27 ENCOUNTER — Ambulatory Visit: Attending: Cardiology | Admitting: Cardiology

## 2024-02-27 DIAGNOSIS — G901 Familial dysautonomia [Riley-Day]: Secondary | ICD-10-CM

## 2024-03-10 ENCOUNTER — Ambulatory Visit (INDEPENDENT_AMBULATORY_CARE_PROVIDER_SITE_OTHER): Admitting: Family

## 2024-03-10 DIAGNOSIS — R300 Dysuria: Secondary | ICD-10-CM | POA: Diagnosis not present

## 2024-03-10 LAB — POCT URINALYSIS DIPSTICK
Bilirubin, UA: NEGATIVE
Blood, UA: NEGATIVE
Glucose, UA: NEGATIVE
Ketones, UA: NEGATIVE
Leukocytes, UA: NEGATIVE
Nitrite, UA: NEGATIVE
Protein, UA: NEGATIVE
Spec Grav, UA: 1.025 (ref 1.010–1.025)
Urobilinogen, UA: 0.2 U/dL
pH, UA: 6 (ref 5.0–8.0)

## 2024-03-10 NOTE — Progress Notes (Signed)
   CHIEF COMPLAINT  UA/ only visit fot UTI     REASON FOR VISIT  Possible UTI, UA Visit Only      Results for orders placed or performed in visit on 03/10/24 (from the past 48 hours)  POCT Urinalysis Dipstick (16109)     Status: None   Collection Time: 03/10/24  2:45 PM  Result Value Ref Range   Color, UA dark yellow    Clarity, UA clear    Glucose, UA Negative Negative   Bilirubin, UA negative    Ketones, UA negative    Spec Grav, UA 1.025 1.010 - 1.025   Blood, UA negative    pH, UA 6.0 5.0 - 8.0   Protein, UA Negative Negative   Urobilinogen, UA 0.2 0.2 or 1.0 E.U./dL   Nitrite, UA negative    Leukocytes, UA Negative Negative   Appearance clear    Odor no     ASSESSMENT & PLAN Diagnoses and all orders for this visit:  Dysuria -     POCT Urinalysis Dipstick (60454)     Patient notified.  Total time spent: 5 minutes  Trenda Frisk, FNP 03/10/2024

## 2024-03-11 ENCOUNTER — Ambulatory Visit: Admitting: Family

## 2024-03-11 ENCOUNTER — Encounter: Payer: Self-pay | Admitting: Family

## 2024-03-11 ENCOUNTER — Ambulatory Visit: Payer: Self-pay

## 2024-03-11 VITALS — BP 104/76 | HR 90 | Ht 66.0 in | Wt 157.6 lb

## 2024-03-11 DIAGNOSIS — R300 Dysuria: Secondary | ICD-10-CM | POA: Diagnosis not present

## 2024-03-11 DIAGNOSIS — N760 Acute vaginitis: Secondary | ICD-10-CM

## 2024-03-11 DIAGNOSIS — Z0130 Encounter for examination of blood pressure without abnormal findings: Secondary | ICD-10-CM

## 2024-03-11 LAB — URINALYSIS, ROUTINE W REFLEX MICROSCOPIC
Bilirubin, UA: NEGATIVE
Glucose, UA: NEGATIVE
Ketones, UA: NEGATIVE
Leukocytes,UA: NEGATIVE
Nitrite, UA: NEGATIVE
Protein,UA: NEGATIVE
RBC, UA: NEGATIVE
Specific Gravity, UA: 1.024 (ref 1.005–1.030)
Urobilinogen, Ur: 0.2 mg/dL (ref 0.2–1.0)
pH, UA: 5.5 (ref 5.0–7.5)

## 2024-03-11 NOTE — Progress Notes (Signed)
 Acute Office Visit  Subjective:     Patient ID: Lori [name], female    DOB: 07-10-1966, 58 y.o.   MRN: [id number]  Patient is in today for  Chief Complaint  Patient presents with   Dysuria    Dysuria  This is a new problem. The current episode started in the past 7 days. The problem occurs every urination. The problem has been waxing and waning. The quality of the pain is described as burning. The pain is severe. There has been no fever. There is No history of pyelonephritis. Associated symptoms include flank pain, frequency and urgency. She has tried home medications for the symptoms. The treatment provided no relief. Her past medical history is significant for kidney stones and recurrent UTIs.     Review of Systems  Genitourinary:  Positive for dysuria, flank pain, frequency and urgency.  All other systems reviewed and are negative.       Objective:     BP 104/76   Pulse 90   Ht 5\' 6"  (1.676 m)   Wt 157 lb 9.6 oz (71.5 kg)   LMP 11/19/2015   SpO2 98%   BMI 25.44 kg/m   Physical Exam Vitals and nursing note reviewed.  Constitutional:      Appearance: Normal appearance. She is normal weight.  HENT:     Head: Normocephalic and atraumatic.     Right Ear: Tympanic membrane normal.     Left Ear: Tympanic membrane normal.     Mouth/Throat:     Mouth: Mucous membranes are moist.  Eyes:     Extraocular Movements: Extraocular movements intact.     Conjunctiva/sclera: Conjunctivae normal.     Pupils: Pupils are equal, round, and reactive to light.  Cardiovascular:     Rate and Rhythm: Normal rate.  Pulmonary:     Effort: Pulmonary effort is normal.  Neurological:     General: No focal deficit present.     Mental Status: She is alert and oriented to person, place, and time. Mental status is at baseline.  Psychiatric:        Mood and Affect: Mood normal.        Behavior: Behavior normal.        Thought Content: Thought content normal.        Judgment: Judgment  normal.     No results found for any visits on 03/11/24.  Recent Results (from the past 2160 hours)  POCT Urinalysis Dipstick (40981)     Status: None   Collection Time: 03/10/24  2:45 PM  Result Value Ref Range   Color, UA dark yellow    Clarity, UA clear    Glucose, UA Negative Negative   Bilirubin, UA negative    Ketones, UA negative    Spec Grav, UA 1.025 1.010 - 1.025   Blood, UA negative    pH, UA 6.0 5.0 - 8.0   Protein, UA Negative Negative   Urobilinogen, UA 0.2 0.2 or 1.0 E.U./dL   Nitrite, UA negative    Leukocytes, UA Negative Negative   Appearance clear    Odor no     Allergies as of 03/11/2024       Reactions   Atorvastatin  Swelling   Ciprofloxacin Hcl Other (See Comments)   Heart condition  Lowers blood pressure   Cyclobenzaprine Other (See Comments)   Angina; lowers BP   Latex    Levaquin [levofloxacin In D5w]    Tremors, nausea   Nortriptyline Other (See Comments)  Angina, lowers BP   Pravastatin  Other (See Comments)   gingivitis   Prednisone Other (See Comments)   Has heart condition causes BP to gt too low.   Sulfamethizole Hives   Repatha  [evolocumab ] Other (See Comments)   myalgias   Naproxen Rash        Medication List        Accurate as of Mar 11, 2024  2:41 PM. If you have any questions, ask your nurse or doctor.          STOP taking these medications    amoxicillin -clavulanate 875-125 MG tablet Commonly known as: AUGMENTIN  Stopped by: Trenda Frisk   nitrofurantoin  (macrocrystal-monohydrate) 100 MG capsule Commonly known as: MACROBID  Stopped by: Trenda Frisk       TAKE these medications    albuterol  108 (90 Base) MCG/ACT inhaler Commonly known as: VENTOLIN  HFA Inhale 1-2 puffs into the lungs every 4 (four) hours as needed for wheezing or shortness of breath.   Azelastine-Fluticasone  137-50 MCG/ACT Susp Place 2 sprays into both nostrils daily.   BIOTIN PO Take 1 tablet by mouth once a week.   Collagen  Hydrolysate (Bovine) Powd daily.   furosemide  20 MG tablet Commonly known as: LASIX  TAKE 1 TABLET BY MOUTH EVERY DAY AS NEEDED   loratadine  10 MG tablet Commonly known as: CLARITIN  Take 10 mg by mouth daily as needed.   multivitamin tablet Take 1 tablet by mouth daily.   Praluent  75 MG/ML Soaj Generic drug: Alirocumab  Inject 1 pen  into the skin every 14 (fourteen) days.            Assessment & Plan:   Problem List Items Addressed This Visit   None Visit Diagnoses       Acute vaginitis    -  Primary   Relevant Orders   NuSwab Vaginitis Plus (VG+)     Dysuria       Relevant Orders   Urinalysis, Routine w reflex microscopic   Urine Culture      Since UA yesterday in office was WNL, sending for UA/UC at [facility].  Also sending NuSwab.   Will call patient with results when available.   Return if symptoms worsen or fail to improve.  Total time spent: 20 minutes  Trenda Frisk, FNP  03/11/2024   This document may have been prepared by [facility] Voice Recognition software and as such may include unintentional dictation errors.

## 2024-03-12 ENCOUNTER — Other Ambulatory Visit: Payer: Self-pay | Admitting: Family

## 2024-03-12 ENCOUNTER — Encounter: Payer: Self-pay | Admitting: Family

## 2024-03-12 ENCOUNTER — Telehealth: Payer: Self-pay | Admitting: Family

## 2024-03-12 MED ORDER — AZITHROMYCIN 250 MG PO TABS: ORAL_TABLET | ORAL | 0 refills | Status: AC

## 2024-03-12 NOTE — Telephone Encounter (Signed)
 Patient left VM that her pharmacy has not received a prescription for her. She states that you were supposed to send something in when she was in the office yesterday. Please send the appropriate Rx.

## 2024-03-13 LAB — NUSWAB VAGINITIS PLUS (VG+)
Candida albicans, NAA: NEGATIVE
Candida glabrata, NAA: NEGATIVE
Chlamydia trachomatis, NAA: NEGATIVE
Neisseria gonorrhoeae, NAA: NEGATIVE
Trich vag by NAA: NEGATIVE

## 2024-03-14 LAB — URINE CULTURE

## 2024-03-16 ENCOUNTER — Encounter (INDEPENDENT_AMBULATORY_CARE_PROVIDER_SITE_OTHER): Payer: Self-pay

## 2024-03-16 ENCOUNTER — Telehealth: Payer: Self-pay | Admitting: Family

## 2024-03-16 ENCOUNTER — Ambulatory Visit: Payer: Self-pay | Admitting: Family

## 2024-03-16 ENCOUNTER — Other Ambulatory Visit: Payer: Self-pay

## 2024-03-16 MED ORDER — NITROFURANTOIN MONOHYD MACRO 100 MG PO CAPS: 100.0000 mg | ORAL_CAPSULE | Freq: Two times a day (BID) | ORAL | 0 refills | Status: DC

## 2024-03-16 MED ORDER — FLUCONAZOLE 150 MG PO TABS: 150.0000 mg | ORAL_TABLET | Freq: Every day | ORAL | 0 refills | Status: DC

## 2024-03-16 NOTE — Telephone Encounter (Signed)
 Pt came to office stating that she can't continue with the pain and high fever she is experiencing. She is still urinating frequently, pain in her lower back & abdomen as well. Pt was wondering if you could send in a different medication because the meds that you sent she finished and is still feeling ill  Please advice

## 2024-03-16 NOTE — Addendum Note (Signed)
 Addended by: Evander Hills on: 03/16/2024 06:40 PM   Modules accepted: Orders

## 2024-04-20 ENCOUNTER — Ambulatory Visit: Admitting: Family

## 2024-04-20 DIAGNOSIS — R300 Dysuria: Secondary | ICD-10-CM | POA: Diagnosis not present

## 2024-04-20 LAB — POCT URINALYSIS DIPSTICK
Bilirubin, UA: NEGATIVE
Glucose, UA: NEGATIVE
Ketones, UA: NEGATIVE
Leukocytes, UA: NEGATIVE
Nitrite, UA: NEGATIVE
Protein, UA: NEGATIVE
Spec Grav, UA: 1.02 (ref 1.010–1.025)
Urobilinogen, UA: 0.2 U/dL
pH, UA: 5.5 (ref 5.0–8.0)

## 2024-04-20 LAB — URINALYSIS, ROUTINE W REFLEX MICROSCOPIC
Bilirubin, UA: NEGATIVE
Glucose, UA: NEGATIVE
Ketones, UA: NEGATIVE
Leukocytes,UA: NEGATIVE
Nitrite, UA: NEGATIVE
RBC, UA: NEGATIVE
Specific Gravity, UA: 1.021 (ref 1.005–1.030)
Urobilinogen, Ur: 0.2 mg/dL (ref 0.2–1.0)
pH, UA: 6 (ref 5.0–7.5)

## 2024-04-22 LAB — URINE CULTURE: Organism ID, Bacteria: NO GROWTH

## 2024-04-23 ENCOUNTER — Ambulatory Visit: Payer: Self-pay

## 2024-05-02 NOTE — Progress Notes (Signed)
   CHIEF COMPLAINT  UA/ only visit fot UTI     REASON FOR VISIT  Possible UTI, UA Visit Only      ASSESSMENT & PLAN Diagnoses and all orders for this visit:  Dysuria -     POCT Urinalysis Dipstick (18997) -     Urinalysis, Routine w reflex microscopic -     Urine Culture     Patient notified.  Total time spent: 5 minutes  ALAN CHRISTELLA ARRANT, FNP 04/20/2024

## 2024-05-16 ENCOUNTER — Encounter: Payer: Self-pay | Admitting: Internal Medicine

## 2024-05-21 ENCOUNTER — Ambulatory Visit: Admitting: Family

## 2024-05-24 ENCOUNTER — Encounter: Payer: Self-pay | Admitting: Family

## 2024-05-24 ENCOUNTER — Ambulatory Visit: Admitting: Family

## 2024-05-24 VITALS — BP 122/80 | HR 78 | Ht 66.0 in | Wt 163.0 lb

## 2024-05-24 DIAGNOSIS — Z77018 Contact with and (suspected) exposure to other hazardous metals: Secondary | ICD-10-CM

## 2024-05-24 DIAGNOSIS — T50904A Poisoning by unspecified drugs, medicaments and biological substances, undetermined, initial encounter: Secondary | ICD-10-CM

## 2024-05-24 DIAGNOSIS — E782 Mixed hyperlipidemia: Secondary | ICD-10-CM | POA: Diagnosis not present

## 2024-05-24 NOTE — Progress Notes (Signed)
 Established Patient Office Visit  Subjective:  Patient ID: Analyce [name], female    DOB: Jul 09, 1966  Age: 58 y.o. MRN: [id number]  Chief Complaint  Patient presents with   Acute Visit    Stomach issues    Abdominal pain.  Feels like she is having continued diarrhea.  Happens when she's eating, particularly notices this at her ex's home.  Is concerned that he may be poisoning her food, asks if we can test.       No other concerns at this time.   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/08/2023   Chest pain    normal LV function by echo 10/2010   High cholesterol    History of syncope 07/07/2012   Idioventricular rhythm (HCC)    Kidney stone    Palpitations    Strain of neck muscle 09/08/2023    Past Surgical History:  Procedure Laterality Date   APPENDECTOMY     CESAREAN SECTION     x2   CHOLECYSTECTOMY     neurocardiogenic syncope     PATENT FORAMEN OVALE CLOSURE     SURGERY ON FACE     AFTER FALL   WRIST SURGERY     broken wrist,left    Social History   Socioeconomic History   Marital status: Married    Spouse name: Not on file   Number of children: Not on file   Years of education: Not on file   Highest education level: Not on file  Occupational History   Not on file  Tobacco Use   Smoking status: Former    Current packs/day: 0.00    Average packs/day: 1 pack/day for 5.0 years (5.0 ttl pk-yrs)    Types: Cigarettes    Start date: 02/11/2001    Quit date: 02/11/2006    Years since quitting: 18.2   Smokeless tobacco: Never  Vaping Use   Vaping status: Never Used  Substance and Sexual Activity   Alcohol use: No    Alcohol/week: 0.0 standard drinks of alcohol   Drug use: No   Sexual activity: Not Currently    Partners: Male    Birth control/protection: Post-menopausal  Other Topics Concern   Not on file  Social History Narrative   Not on file   Social Drivers of  Health   Financial Resource Strain: Low Risk  (10/28/2019)   Received from [facility]   Overall Financial Resource Strain (CARDIA)    Difficulty of Paying Living Expenses: Not hard at all  Food Insecurity: No Food Insecurity (10/28/2019)   Received from [facility]   Hunger Vital Sign    Within the past 12 months, you worried that your food would run out before you got the money to buy more.: Never true    Within the past 12 months, the food you bought just didn't last and you didn't have money to get more.: Never true  Transportation Needs: No Transportation Needs (10/28/2019)   Received from [facility] - Transportation    Lack of Transportation (Medical): No    Lack of Transportation (Non-Medical): No  Physical Activity: Sufficiently Active (10/28/2019)   Received from [facility]   Exercise Vital Sign    On average, how many days per week do you engage in moderate to strenuous exercise (like a brisk walk)?: 7 days    On average, how  many minutes do you engage in exercise at this level?: 60 min  Stress: No Stress Concern Present (10/28/2019)   Received from [facility] Occupational [facility] - Occupational Stress Questionnaire    Feeling of Stress : Not at all  Social Connections: Unknown (10/28/2019)   Received from [facility]   Social Connection and Isolation Panel    In a typical week, how many times do you talk on the phone with family, friends, or neighbors?: More than three times a week    How often do you get together with friends or relatives?: More than three times a week    Attends Religious Services: Not on file    Do you belong to any clubs or organizations such as church groups, unions, fraternal or athletic groups, or school groups?: Yes    Attends Banker Meetings: Not on file    Are you married, widowed, divorced, separated, never married, or living with a partner?: Married  Catering manager  Violence: Not on file    Family History  Problem Relation Age of Onset   Hypertension Mother    Heart disease Father 90       MI   Hyperlipidemia Father    Cancer Maternal Aunt    Uterine cancer Maternal Aunt    Breast cancer Neg Hx    Ovarian cancer Neg Hx    Colon cancer Neg Hx     Allergies  Allergen Reactions   Atorvastatin  Swelling   Ciprofloxacin Hcl Other (See Comments)    Heart condition  Lowers blood pressure   Cyclobenzaprine Other (See Comments)    Angina; lowers BP   Latex    Levaquin [Levofloxacin In D5w]     Tremors, nausea   Nortriptyline Other (See Comments)    Angina, lowers BP   Pravastatin  Other (See Comments)    gingivitis   Prednisone Other (See Comments)    Has heart condition causes BP to gt too low.   Sulfamethizole Hives   Repatha  [Evolocumab ] Other (See Comments)    myalgias   Naproxen Rash    Review of Systems  Gastrointestinal:  Positive for abdominal pain, diarrhea and nausea.  All other systems reviewed and are negative.      Objective:   BP 122/80   Pulse 78   Ht 5' 6 (1.676 m)   Wt 163 lb (73.9 kg)   LMP 11/19/2015   SpO2 97%   BMI 26.31 kg/m   Vitals:   05/24/24 1508  BP: 122/80  Pulse: 78  Height: 5' 6 (1.676 m)  Weight: 163 lb (73.9 kg)  SpO2: 97%  BMI (Calculated): 26.32    Physical Exam Vitals and nursing note reviewed.  Constitutional:      Appearance: Normal appearance. She is normal weight.  HENT:     Head: Normocephalic.  Eyes:     Extraocular Movements: Extraocular movements intact.     Conjunctiva/sclera: Conjunctivae normal.     Pupils: Pupils are equal, round, and reactive to light.  Cardiovascular:     Rate and Rhythm: Normal rate.  Pulmonary:     Effort: Pulmonary effort is normal.  Neurological:     General: No focal deficit present.     Mental Status: She is alert and oriented to person, place, and time. Mental status is at baseline.  Psychiatric:        Mood and Affect: Mood normal.         Behavior:  Behavior normal.        Thought Content: Thought content normal.      No results found for any visits on 05/24/24.  Recent Results (from the past 2160 hours)  POCT Urinalysis Dipstick (18997)     Status: None   Collection Time: 03/10/24  2:45 PM  Result Value Ref Range   Color, UA dark yellow    Clarity, UA clear    Glucose, UA Negative Negative   Bilirubin, UA negative    Ketones, UA negative    Spec Grav, UA 1.025 1.010 - 1.025   Blood, UA negative    pH, UA 6.0 5.0 - 8.0   Protein, UA Negative Negative   Urobilinogen, UA 0.2 0.2 or 1.0 E.U./dL   Nitrite, UA negative    Leukocytes, UA Negative Negative   Appearance clear    Odor no   Urine Culture     Status: Abnormal   Collection Time: 03/11/24 11:42 AM   Specimen: Urine, Clean Catch   UR  Result Value Ref Range   Urine Culture, Routine Final report (A)    Organism ID, Bacteria Escherichia coli (A)     Comment: Cefazolin with an MIC <=16 predicts susceptibility to the oral agents cefaclor, cefdinir, cefpodoxime, cefprozil, cefuroxime, cephalexin, and loracarbef when used for therapy of uncomplicated urinary tract infections due to E. coli, Klebsiella pneumoniae, and Proteus mirabilis. Multi-Drug Resistant Organism 25,000-50,000 colony forming units per mL    Antimicrobial Susceptibility Comment     Comment:       ** S = Susceptible; I = Intermediate; R = Resistant **                    P = Positive; N = Negative             MICS are expressed in micrograms per mL    Antibiotic                 RSLT#1    RSLT#2    RSLT#3    RSLT#4 Amoxicillin /Clavulanic Acid    S Ampicillin                     R Cefazolin                      S Cefepime                       S Cefoxitin                      S Cefpodoxime                    S Ceftriaxone                    S Ciprofloxacin                  S Ertapenem                      S Gentamicin                     S Levofloxacin                    S Meropenem  S Nitrofurantoin                  S Piperacillin /Tazobactam        S Tetracycline                   R Tobramycin                     S Trimethoprim/Sulfa             R   Urinalysis, Routine w reflex microscopic     Status: Abnormal   Collection Time: 03/11/24 11:53 AM  Result Value Ref Range   Specific Gravity, UA 1.024 1.005 - 1.030   pH, UA 5.5 5.0 - 7.5   Color, UA Yellow Yellow   Appearance Ur Cloudy (A) Clear   Leukocytes,UA Negative Negative   Protein,UA Negative Negative/Trace   Glucose, UA Negative Negative   Ketones, UA Negative Negative   RBC, UA Negative Negative   Bilirubin, UA Negative Negative   Urobilinogen, Ur 0.2 0.2 - 1.0 mg/dL   Nitrite, UA Negative Negative   Microscopic Examination Comment     Comment: Microscopic not indicated and not performed.  NuSwab Vaginitis Plus (VG+)     Status: None   Collection Time: 03/11/24 12:03 PM  Result Value Ref Range   Atopobium vaginae Low - 0 Score   BVAB 2 Low - 0 Score   Megasphaera 1 Low - 0 Score    Comment: Calculate total score by adding the 3 individual bacterial vaginosis (BV) marker scores together.  Total score is interpreted as follows: Total score 0-1: Indicates the absence of BV. Total score   2: Indeterminate for BV. Additional clinical                  data should be evaluated to establish a                  diagnosis. Total score 3-6: Indicates the presence of BV.    Candida albicans, NAA Negative Negative   Candida glabrata, NAA Negative Negative   Trich vag by NAA Negative Negative   Chlamydia trachomatis, NAA Negative Negative   Neisseria gonorrhoeae, NAA Negative Negative  POCT Urinalysis Dipstick (18997)     Status: None   Collection Time: 04/20/24  1:18 PM  Result Value Ref Range   Color, UA Yellow    Clarity, UA Clear    Glucose, UA Negative Negative   Bilirubin, UA Negative    Ketones, UA Negative    Spec Grav, UA 1.020 1.010 - 1.025   Blood, UA Trace     pH, UA 5.5 5.0 - 8.0   Protein, UA Negative Negative   Urobilinogen, UA 0.2 0.2 or 1.0 E.U./dL   Nitrite, UA Negative    Leukocytes, UA Negative Negative   Appearance Clear    Odor No   Urine Culture     Status: None   Collection Time: 04/20/24  3:53 PM   Specimen: Urine, Clean Catch   UR  Result Value Ref Range   Urine Culture, Routine Final report    Organism ID, Bacteria No growth   Urinalysis, Routine w reflex microscopic     Status: None   Collection Time: 04/20/24  3:55 PM  Result Value Ref Range   Specific Gravity, UA 1.021 1.005 - 1.030   pH, UA 6.0 5.0 - 7.5   Color, UA Yellow Yellow   Appearance Ur Clear Clear   Leukocytes,UA  Negative Negative   Protein,UA Trace Negative/Trace   Glucose, UA Negative Negative   Ketones, UA Negative Negative   RBC, UA Negative Negative   Bilirubin, UA Negative Negative   Urobilinogen, Ur 0.2 0.2 - 1.0 mg/dL   Nitrite, UA Negative Negative   Microscopic Examination Comment     Comment: Microscopic not indicated and not performed.       Assessment & Plan:   Assessment & Plan Mixed hyperlipidemia Checking labs today.  Continue current therapy for lipid control. Will modify as needed based on labwork results.   -CMP w/eGFR -Lipid Panel  Poisoning by drugs and medicinal substances, undetermined whether accidentally or purposely inflicted, initial encounter Suspected exposure to heavy metals Checking labs today Will call pt with results when available.     No follow-ups on file.   Total time spent: 20 minutes  ALAN CHRISTELLA ARRANT, FNP  05/24/2024   This document may have been prepared by [facility] Voice Recognition software and as such may include unintentional dictation errors.

## 2024-05-25 ENCOUNTER — Other Ambulatory Visit

## 2024-05-28 LAB — HEAVY METALS PROFILE II, BLOOD
Arsenic: 2 ug/L (ref 0–9)
Cadmium: <0.5 ug/L (ref 0.0–1.2)
Lead, Blood: <1 ug/dL (ref 0.0–3.4)
Mercury: 1.7 ug/L (ref 0.0–14.9)

## 2024-05-28 LAB — LIPID PANEL
Chol/HDL Ratio: 4.9 ratio — ABNORMAL HIGH (ref 0.0–4.4)
Cholesterol, Total: 302 mg/dL — ABNORMAL HIGH (ref 100–199)
HDL: 62 mg/dL (ref >39)
LDL Chol Calc (NIH): 209 mg/dL — ABNORMAL HIGH (ref 0–99)
Triglycerides: 168 mg/dL — ABNORMAL HIGH (ref 0–149)
VLDL Cholesterol Cal: 31 mg/dL (ref 5–40)

## 2024-05-28 LAB — TOTAL GLUTATHIONE: Total Glutathione: 308 ug/mL (ref 176–323)

## 2024-05-28 LAB — COMPLIANCE DRUG ANALYSIS, UR

## 2024-06-03 ENCOUNTER — Other Ambulatory Visit: Payer: Self-pay | Admitting: Internal Medicine

## 2024-06-03 ENCOUNTER — Telehealth: Payer: Self-pay | Admitting: Internal Medicine

## 2024-06-03 MED ORDER — PRALUENT 75 MG/ML ~~LOC~~ SOAJ
75.0000 mg | SUBCUTANEOUS | 3 refills | Status: DC
Start: 2024-06-03 — End: 2024-07-01

## 2024-06-03 NOTE — Telephone Encounter (Signed)
 Dr. Fernande is the patient's primary cardiologist; I have only seen her once for urgent evaluation of chest pain as DOD in 2023.  She has not been seen by our practice in over a year (last visit was with Dr. Fernande on 09/26/2022).  If she wishes for us  to manage her lipids, she will need to be seen by any available provider (given Dr. Celine absence) to reestablish ongoing care.  Thanks.  Lonni Hanson, MD [facility]

## 2024-06-03 NOTE — Telephone Encounter (Signed)
*  STAT* If patient is at the pharmacy, call can be transferred to refill team.   1. Which medications need to be refilled? (please list name of each medication and dose if known)   Alirocumab  (PRALUENT ) 75 MG/ML SOAJ     4. Which pharmacy/location (including street and city if local pharmacy) is medication to be sent to? [id number] [place],  [phone]     5. Do they need a 30 day or 90 day supply? 90   Scheduled 9/12

## 2024-06-07 ENCOUNTER — Telehealth: Payer: Self-pay

## 2024-06-07 ENCOUNTER — Other Ambulatory Visit (HOSPITAL_COMMUNITY): Payer: Self-pay

## 2024-06-07 NOTE — Telephone Encounter (Signed)
 PA request has been Received. New Encounter has been or will be created for follow up. For additional info see Pharmacy Prior Auth telephone encounter from 06/07/24.

## 2024-06-07 NOTE — Telephone Encounter (Signed)
 Pharmacy Patient Advocate Encounter   Received notification from Physician's Office that prior authorization for PRALUENT  is required/requested.   Insurance verification completed.   The patient is insured through [employer] .   Per test claim: PA required; PA submitted to above mentioned insurance via Fax Key/confirmation #/EOC NA Status is pending  SENT VIA FAX, FORM NOT IN LATENT.  LATENT TEAM MADE AWARE AND FORM HAS BEEN PROVIDED TO SUPPORT TEAM.

## 2024-06-08 NOTE — Telephone Encounter (Signed)
   Scanned in media too

## 2024-06-09 ENCOUNTER — Telehealth: Payer: Self-pay | Admitting: Pharmacy Technician

## 2024-06-09 ENCOUNTER — Other Ambulatory Visit (HOSPITAL_COMMUNITY): Payer: Self-pay

## 2024-06-09 NOTE — Telephone Encounter (Signed)
 Started PA but seen she is allergic. Routing to pharmD to see if I shall continue- saved in latent

## 2024-06-10 ENCOUNTER — Telehealth: Payer: Self-pay

## 2024-06-10 ENCOUNTER — Other Ambulatory Visit (HOSPITAL_COMMUNITY): Payer: Self-pay

## 2024-06-10 NOTE — Telephone Encounter (Signed)
 Pharmacy Patient Advocate Encounter   Received notification from Physician's Office that prior authorization for PRALUENT  is required/requested.   Insurance verification completed.   The patient is insured through [employer] FEP .   Per test claim: PA required; PA submitted to above mentioned insurance via Fax Key/confirmation #/EOC [id number] Status is pending

## 2024-06-10 NOTE — Telephone Encounter (Signed)
 Switching to praluent - allergic to repatha ..   cancelling PA

## 2024-06-11 NOTE — Telephone Encounter (Signed)
 Pharmacy Patient Advocate Encounter  Received notification from [facility] FEP that Prior Authorization for PRALUENT  has been DENIED.  Full denial letter will be uploaded to the media tab. See denial reason below.

## 2024-06-28 NOTE — Assessment & Plan Note (Signed)
 Checking labs today.  Continue current therapy for lipid control. Will modify as needed based on labwork results.   -CMP w/eGFR -Lipid Panel

## 2024-06-30 NOTE — Telephone Encounter (Signed)
 Pt daughter calling back to discuss medication

## 2024-07-01 ENCOUNTER — Telehealth: Payer: Self-pay | Admitting: Internal Medicine

## 2024-07-01 DIAGNOSIS — E782 Mixed hyperlipidemia: Secondary | ICD-10-CM

## 2024-07-01 DIAGNOSIS — E785 Hyperlipidemia, unspecified: Secondary | ICD-10-CM

## 2024-07-01 MED ORDER — PRALUENT 75 MG/ML ~~LOC~~ SOAJ: 75.0000 mg | SUBCUTANEOUS | 0 refills | Status: DC

## 2024-07-01 NOTE — Telephone Encounter (Signed)
*  STAT* If patient is at the pharmacy, call can be transferred to refill team.   1. Which medications need to be refilled? (please list name of each medication and dose if known)   Alirocumab  (PRALUENT ) 75 MG/ML SOAJ   2. Would you like to learn more about the convenience, safety, & potential cost savings by using the [facility]?   3. Are you open to using the [facility] (Type [facility]. ).  4. Which pharmacy/location (including street and city if local pharmacy) is medication to be sent to?  [id number] [place], [place] [phone]   5. Do they need a 30 day or 90 day supply?   Patient stated she is completely out of this medication.  Patient has an appointment scheduled with Dr. Mady on 9/12.  Patient wants a call back to confirm.

## 2024-07-09 ENCOUNTER — Ambulatory Visit: Attending: Internal Medicine | Admitting: Internal Medicine

## 2024-07-09 ENCOUNTER — Encounter: Payer: Self-pay | Admitting: Internal Medicine

## 2024-07-09 VITALS — BP 108/60 | HR 67 | Wt 159.5 lb

## 2024-07-09 DIAGNOSIS — R55 Syncope and collapse: Secondary | ICD-10-CM | POA: Diagnosis not present

## 2024-07-09 DIAGNOSIS — Z87898 Personal history of other specified conditions: Secondary | ICD-10-CM

## 2024-07-09 DIAGNOSIS — G909 Disorder of the autonomic nervous system, unspecified: Secondary | ICD-10-CM

## 2024-07-09 DIAGNOSIS — E782 Mixed hyperlipidemia: Secondary | ICD-10-CM

## 2024-07-09 DIAGNOSIS — Z79899 Other long term (current) drug therapy: Secondary | ICD-10-CM

## 2024-07-09 MED ORDER — PRALUENT 75 MG/ML ~~LOC~~ SOAJ: 75.0000 mg | SUBCUTANEOUS | 3 refills | Status: AC

## 2024-07-09 NOTE — Progress Notes (Signed)
 Cardiology Office Note:  .   Date:  07/09/2024  ID:  Lori [name], DOB [birth year], MRN [id number] PCP: Lori Alan HERO, FNP  [facility] Providers Cardiologist:  Lori Hanson, MD Electrophysiologist:  Lori Sage, MD     History of Present Illness: .   Lori [name] is a 58 y.o. female with history of HFpEF, pericarditis, dysautonomia, PVCs, hyperlipidemia, asthma, and uterine cancer, who presents for follow-up of hyperlipidemia and dysautonomia.  History is obtained with assistance of Spanish interpreter.  I saw her once in 2023 for evaluation of chest pain.  She has primarily followed with Lori [name].  Chest pain was felt to be due to costochondritis.  NSAIDs were recommended.  She saw Lori [name] a couple weeks later at which time she noted some orthostatic lightheadedness as well as mild shortness of breath in the setting of having just been diagnosed with COVID-19.  No medication changes or additional testing were pursued.  Today, Lori [name] is concerned that she has been out of her Praluent  for at least 6 months, being unable to get a refill for her prescription.  She had been tolerating this well with significant improvement in her LDL.  She has tried multiple statins as well as Repatha  in the past and did not tolerate them due to side effects/allergic reactions.  From a heart standpoint, she continues to have episodes of tachypalpitations and lightheadedness as well as sporadic syncope.  She last passed out about 2 weeks ago.  It had been very hot outside and she noticed that her heart began racing.  She then became profoundly weak and believes she was unconscious for about 5 minutes.  This is similar to episodes that she has experienced for years.  They tend to occur when there are sudden temperature changes or when she gets very hot.  She has been using special salt from [place] that she places under her tongue when she first begins to develop the symptoms, often helping to abort the  episodes.  She stays well-hydrated.  She has not had any chest pain or shortness of breath.  She notes occasional lower extremity edema, though this has been well-controlled with compression stockings.  Lori [name] has talked with her in the past about using compressive garments such as spanks, though she does not like wearing these and does not use them on a regular basis.  She requests a renewal of her handicap placard today, as walking long distances from the parking lot into stores makes her lightheaded and concerned that she may pass out.  ROS: See HPI  Studies Reviewed: SABRA   EKG Interpretation Date/Time:  Friday July 09 2024 09:06:03 EDT Ventricular Rate:  67 PR Interval:  156 QRS Duration:  70 QT Interval:  388 QTC Calculation: 409 R Axis:   6  Text Interpretation: Normal sinus rhythm Low voltage QRS Borderline ECG When compared with ECG of 26-Sep-2022 No significant change was found Confirmed by Lori [name], Lori [phone]) on 07/09/2024 9:08:25 AM    Risk Assessment/Calculations:             Physical Exam:   VS:  BP 108/60 (BP Location: Left Arm, Patient Position: Sitting, Cuff Size: Normal)   Pulse 67   Wt 159 lb 8 oz (72.3 kg)   LMP 11/19/2015   SpO2 98%   BMI 25.74 kg/m    Wt Readings from Last 3 Encounters:  07/09/24 159 lb 8 oz (72.3 kg)  05/24/24 163 lb (73.9 kg)  03/11/24 157  lb 9.6 oz (71.5 kg)    General:  NAD.  Accompanied by her daughter. Neck: No JVD or HJR. Lungs: Clear to auscultation bilaterally without wheezes or crackles. Heart: Regular rate and rhythm without murmurs, rubs, or gallops. Abdomen: Soft, nontender, nondistended. Extremities: No lower extremity edema.  ASSESSMENT AND PLAN: .    Autonomic dysfunction and syncope: Lori [name] has a long history of presyncope and syncope due to autonomic dysfunction, previously managed in our clinic by Lori [name].  She overall reports good control of this with increased sodium intake and adequate hydration,  though she still has some breakthrough episodes during the summer heat and when there are sudden temperature changes.  We discussed the natural history of autonomic dysfunction and importance of these interventions as well as recognition of early symptoms so that she can lie down to prevent complete loss of consciousness.  I will renew her handicap placard today.  I advised her to avoid driving for 6 months from her most recent syncopal episode.  If episodes increase in frequency/duration, we may need to consider repeat ambulatory cardiac monitoring and/or EP consultation.  Hyperlipidemia: Lori [name] is known to have severely elevated LDL, most recently 209 in July after being off Praluent  for several months.  We will check a CMP today and plan to resume Praluent  assuming her LFTs are normal.  We will repeat a lipid panel and obtain an ALT in 3 months to assess response.  Chest pain: No further chest pain reported.  Previous episodes felt to be due to costochondritis, though she also reported a remote history of pericarditis.  No further workup/intervention at this time.    Dispo: Return to clinic in 6 months.  Signed, Lori Hanson, MD

## 2024-07-09 NOTE — Patient Instructions (Signed)
 Medication Instructions:  Your physician recommends that you continue on your current medications as directed. Please refer to the Current Medication list given to you today.    *If you need a refill on your cardiac medications before your next appointment, please call your pharmacy*  Lab Work: Your provider would like for you to have following labs drawn today CMP.    Your provider would like for you to return in 3 months to have the following labs drawn: Lipid, ALT.   Please go to [facility] [address] ([facility] [address] [facility] [address], [place] [address] You do not need an appointment.  They are open from 8 am- 4:30 pm.  Lunch from 1:00 pm- 2:00 pm You will need to be fasting.    Testing/Procedures: No test ordered today   Follow-Up: At [facility], you and your health needs are our priority.  As part of our continuing mission to provide you with exceptional heart care, our providers are all part of one team.  This team includes your primary Cardiologist (physician) and Advanced Practice Providers or APPs (Physician Assistants and Nurse Practitioners) who all work together to provide you with the care you need, when you need it.  Your next appointment:   6 month(s)  Provider:   You may see Lonni Hanson, MD or one of the following Advanced Practice Providers on your designated Care Team:   Lonni Meager, NP Lesley Maffucci, PA-C Bernardino Bring, PA-C Cadence [place], PA-C Tylene Lunch, NP Barnie Hila, NP

## 2024-07-10 LAB — COMPREHENSIVE METABOLIC PANEL WITH GFR
ALT: 17 IU/L (ref 0–32)
AST: 17 IU/L (ref 0–40)
Albumin: 4.5 g/dL (ref 3.8–4.9)
Alkaline Phosphatase: 89 IU/L (ref 44–121)
BUN/Creatinine Ratio: 20 (ref 9–23)
BUN: 15 mg/dL (ref 6–24)
Bilirubin Total: 0.3 mg/dL (ref 0.0–1.2)
CO2: 23 mmol/L (ref 20–29)
Calcium: 9.1 mg/dL (ref 8.7–10.2)
Chloride: 106 mmol/L (ref 96–106)
Creatinine, Ser: 0.74 mg/dL (ref 0.57–1.00)
Globulin, Total: 2.6 g/dL (ref 1.5–4.5)
Glucose: 100 mg/dL — ABNORMAL HIGH (ref 70–99)
Potassium: 4.8 mmol/L (ref 3.5–5.2)
Sodium: 144 mmol/L (ref 134–144)
Total Protein: 7.1 g/dL (ref 6.0–8.5)
eGFR: 93 mL/min/1.73 (ref >59)

## 2024-07-11 ENCOUNTER — Ambulatory Visit: Payer: Self-pay | Admitting: Internal Medicine

## 2024-07-17 ENCOUNTER — Other Ambulatory Visit: Payer: Self-pay | Admitting: Internal Medicine

## 2024-07-17 ENCOUNTER — Encounter: Payer: Self-pay | Admitting: Internal Medicine

## 2024-07-17 DIAGNOSIS — E782 Mixed hyperlipidemia: Secondary | ICD-10-CM

## 2024-07-19 NOTE — Telephone Encounter (Signed)
 Patient has familial hyperlipidemia and was intolerant to Repatha .  Did we send an appeal to the denial?

## 2024-07-20 NOTE — Telephone Encounter (Signed)
 Thank you for your help.  This has been an issue in the past.  We will need to keep filing appeals to get Praluent  approved (this has been successful in the past), as she has been intolerant/allergic to multiple statins as well as Repatha .  Lonni Hanson, MD [facility]

## 2024-07-21 ENCOUNTER — Telehealth: Payer: Self-pay | Admitting: Pharmacy Technician

## 2024-07-21 ENCOUNTER — Other Ambulatory Visit (HOSPITAL_COMMUNITY): Payer: Self-pay

## 2024-07-21 NOTE — Telephone Encounter (Signed)
 I called insurance to follow up on this and insurance said this was denied due to her not have a ascvd risk score of 7.5% or higher   Message sent

## 2024-07-22 ENCOUNTER — Other Ambulatory Visit (HOSPITAL_COMMUNITY): Payer: Self-pay

## 2024-07-22 ENCOUNTER — Encounter: Payer: Self-pay | Admitting: Pharmacy Technician

## 2024-07-22 NOTE — Telephone Encounter (Signed)
 Please see email with attached [employer] FEP PA question form. Not sure how to submit under familiar without genetic testing and primary hyperlipidemia still requires an ASCVD risk score for approval. Please advise.

## 2024-07-22 NOTE — Telephone Encounter (Signed)
 error

## 2024-07-23 ENCOUNTER — Encounter: Payer: Self-pay | Admitting: Family

## 2024-07-25 ENCOUNTER — Ambulatory Visit
Admission: EM | Admit: 2024-07-25 | Discharge: 2024-07-25 | Disposition: A | Discharge status: Home / Self Care | Attending: Emergency Medicine | Admitting: Emergency Medicine

## 2024-07-25 ENCOUNTER — Encounter: Payer: Self-pay | Admitting: Emergency Medicine

## 2024-07-25 DIAGNOSIS — B349 Viral infection, unspecified: Secondary | ICD-10-CM | POA: Diagnosis not present

## 2024-07-25 DIAGNOSIS — J45998 Other asthma: Secondary | ICD-10-CM

## 2024-07-25 LAB — POC COVID19/FLU A&B COMBO
Covid Antigen, POC: NEGATIVE
Influenza A Antigen, POC: NEGATIVE
Influenza B Antigen, POC: NEGATIVE

## 2024-07-25 MED ORDER — BENZONATATE 100 MG PO CAPS: 100.0000 mg | ORAL_CAPSULE | Freq: Three times a day (TID) | ORAL | 0 refills | Status: DC

## 2024-07-25 MED ORDER — PROMETHAZINE-DM 6.25-15 MG/5ML PO SYRP: 5.0000 mL | ORAL_SOLUTION | Freq: Every evening | ORAL | 0 refills | Status: DC | PRN

## 2024-07-25 MED ORDER — ALBUTEROL SULFATE HFA 108 (90 BASE) MCG/ACT IN AERS: 1.0000 | INHALATION_SPRAY | RESPIRATORY_TRACT | 0 refills | Status: DC | PRN

## 2024-07-25 NOTE — ED Triage Notes (Addendum)
 Patient reports fatigued, chills, cough and burning in chest x 4 days. Patient took Ibuprofen  for symptoms with mild relief.

## 2024-07-25 NOTE — Discharge Instructions (Signed)
 Today you are evaluated for your cough and chest discomfort, on exam there is wheezing which is a sign of airway tightness which is most likely why you are having trouble breathing, the discomfort to the center of your chest is most likely due to persistent coughing causing soreness to your muscles  COVID and flu test are negative  Symptoms are most likely related to a virus as there are many germs going around the community, typically with viruses they fixed themselves within a 10-day window however if your symptoms do not improve you may follow-up for reevaluation  May use albuterol  inhaler taking 2 puffs with feeling short of breath or having persistent coughing  You may use Tessalon  pill every 8 hours for cough, may use cough syrup at bedtime as needed if having difficulty sleep    You can take Tylenol  and/or Ibuprofen  as needed for fever reduction and pain relief.   For cough: honey 1/2 to 1 teaspoon (you can dilute the honey in water or another fluid).  You can also use guaifenesin and dextromethorphan for cough. You can use a humidifier for chest congestion and cough.  If you don't have a humidifier, you can sit in the bathroom with the hot shower running.      For sore throat: try warm salt water gargles, cepacol lozenges, throat spray, warm tea or water with lemon/honey, popsicles or ice, or OTC cold relief medicine for throat discomfort.   For congestion: take a daily anti-histamine like Zyrtec, Claritin , and a oral decongestant, such as pseudoephedrine.  You can also use Flonase  1-2 sprays in each nostril daily.   It is important to stay hydrated: drink plenty of fluids (water, gatorade/powerade/pedialyte, juices, or teas) to keep your throat moisturized and help further relieve irritation/discomfort.

## 2024-07-25 NOTE — ED Provider Notes (Signed)
 Lori [name]    CSN: [id number] Arrival date & time: 07/25/24  1228      History   Chief Complaint Chief Complaint  Patient presents with   Fatigue   Cough   Chills    HPI Lori [name] is a 58 y.o. female.   Patient presents for evaluation of chills and fatigue beginning 4 days ago, began to experience a nonproductive cough, pain to the center of the chest, a burning sensation to the chest 1 day ago.  Due to the pain in chest, feels short of breath with deep breathing.  Denies presence of wheezing.  History of asthma.  Denies fever.  No known sick contacts.  Has attempted ibuprofen .  Decreased appetite but tolerable to some food and liquids.  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/08/2023   Chest pain    normal LV function by echo 10/2010   High cholesterol    History of syncope 07/07/2012   Idioventricular rhythm (HCC)    Kidney stone    Palpitations    Strain of neck muscle 09/08/2023    Patient Active Problem List   Diagnosis Date Noted   Syncope and collapse 07/09/2024   Peripheral vascular disease 08/18/2023   Chronic heart failure with preserved ejection fraction (HCC) 08/18/2023   Left Achilles tendinitis 10/26/2017   Dental infection 04/06/2016   Neck pain 04/06/2016   BPPV (benign paroxysmal positional vertigo) 02/22/2016   Perennial allergic rhinitis 02/22/2016   Metabolic syndrome 10/03/2015   Eustachian tube dysfunction 09/15/2015   Asthma, mild intermittent, poorly controlled 09/15/2015   Cervical spondylosis 08/29/2015   Overweight (BMI 25.0-29.9) 06/29/2015   IBS (irritable bowel syndrome) 06/29/2015   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 06/22/2015   UPJ (ureteropelvic junction) obstruction 06/22/2015   Cervical radiculitis 07/19/2014   Brain syndrome, posttraumatic 05/25/2014   Autonomic dysfunction 10/01/2012   History of chest pain  07/07/2012   Chest pain 11/04/2011   Depression with anxiety 06/11/2011   Mixed hyperlipidemia 11/02/2010    Past Surgical History:  Procedure Laterality Date   APPENDECTOMY     CESAREAN SECTION     x2   CHOLECYSTECTOMY     neurocardiogenic syncope     PATENT FORAMEN OVALE CLOSURE     SURGERY ON FACE     AFTER FALL   WRIST SURGERY     broken wrist,left    OB History     Gravida  6   Para  4   Term  3   Preterm  1   AB  2   Living  4      SAB  2   IAB      Ectopic      Multiple      Live Births  4            Home Medications    Prior to Admission medications   Medication Sig Start Date End Date Taking? Authorizing Provider  benzonatate  (TESSALON ) 100 MG capsule Take 1 capsule (100 mg total) by mouth every 8 (eight) hours. 07/25/24  Yes Shilpa Bushee R, NP  promethazine-dextromethorphan (PROMETHAZINE-DM) 6.25-15 MG/5ML syrup Take 5 mLs by mouth at bedtime as needed. 07/25/24  Yes Jamilex Bohnsack, Shelba SAUNDERS, NP  albuterol  (VENTOLIN  HFA) 108 (90 Base) MCG/ACT inhaler Inhale 1-2 puffs into the lungs every 4 (four) hours as needed for wheezing or shortness of breath.  07/25/24   Naomi Castrogiovanni, Shelba SAUNDERS, NP  Alirocumab  (PRALUENT ) 75 MG/ML SOAJ Inject 1 mL (75 mg total) into the skin every 14 (fourteen) days. 07/09/24   End, Lonni, MD    Family History Family History  Problem Relation Age of Onset   Hypertension Mother    Heart disease Father 35       MI   Hyperlipidemia Father    Cancer Maternal Aunt    Uterine cancer Maternal Aunt    Breast cancer Neg Hx    Ovarian cancer Neg Hx    Colon cancer Neg Hx     Social History Social History   Tobacco Use   Smoking status: Former    Current packs/day: 0.00    Average packs/day: 1 pack/day for 5.0 years (5.0 ttl pk-yrs)    Types: Cigarettes    Start date: 02/11/2001    Quit date: 02/11/2006    Years since quitting: 18.4   Smokeless tobacco: Never  Vaping Use   Vaping status: Never Used  Substance Use  Topics   Alcohol use: No    Alcohol/week: 0.0 standard drinks of alcohol   Drug use: No     Allergies   Atorvastatin , Ciprofloxacin hcl, Cyclobenzaprine, Latex, Levaquin [levofloxacin in d5w], Nortriptyline, Pravastatin , Prednisone, Sulfamethizole, Repatha  [evolocumab ], and Naproxen   Review of Systems Review of Systems   Physical Exam Triage Vital Signs ED Triage Vitals  Encounter Vitals Group     BP 07/25/24 1259 136/84     Girls Systolic BP Percentile --      Girls Diastolic BP Percentile --      Boys Systolic BP Percentile --      Boys Diastolic BP Percentile --      Pulse Rate 07/25/24 1259 70     Resp 07/25/24 1259 16     Temp 07/25/24 1259 97.7 F (36.5 C)     Temp Source 07/25/24 1259 Oral     SpO2 07/25/24 1259 100 %     Weight --      Height --      Head Circumference --      Peak Flow --      Pain Score 07/25/24 1300 0     Pain Loc --      Pain Education --      Exclude from Growth Chart --    No data found.  Updated Vital Signs BP 136/84 (BP Location: Right Arm)   Pulse 70   Temp 97.7 F (36.5 C) (Oral)   Resp 16   LMP 11/19/2015   SpO2 100%   Visual Acuity Right Eye Distance:   Left Eye Distance:   Bilateral Distance:    Right Eye Near:   Left Eye Near:    Bilateral Near:     Physical Exam Constitutional:      Appearance: Normal appearance.  HENT:     Right Ear: Tympanic membrane, ear canal and external ear normal.     Left Ear: Tympanic membrane, ear canal and external ear normal.     Nose: Nose normal.     Mouth/Throat:     Pharynx: Oropharynx is clear.  Eyes:     Extraocular Movements: Extraocular movements intact.  Cardiovascular:     Rate and Rhythm: Normal rate and regular rhythm.     Pulses: Normal pulses.     Heart sounds: Normal heart sounds.  Pulmonary:     Effort: Pulmonary effort is normal.     Comments: Wheezing to the  left upper lobe, remaining lobes clear Chest:     Comments: Tenderness present to the center of  the chest wall without ecchymosis swelling or deformity, chest wall symmetrical Neurological:     Mental Status: She is alert and oriented to person, place, and time. Mental status is at baseline.      UC Treatments / Results  Labs (all labs ordered are listed, but only abnormal results are displayed) Labs Reviewed  POC COVID19/FLU A&B COMBO    EKG   Radiology No results found.  Procedures Procedures (including critical care time)  Medications Ordered in UC Medications - No data to display  Initial Impression / Assessment and Plan / UC Course  I have reviewed the triage vital signs and the nursing notes.  Pertinent labs & imaging results that were available during my care of the patient were reviewed by me and considered in my medical decision making (see chart for details).  Viral illness  Patient is in no signs of distress nor toxic appearing.  Vital signs are stable.  Low suspicion for pneumonia, pneumothorax or bronchitis and therefore will defer imaging.  COVID and flu test negative, discussed findings with patient.  Etiology most likely viral, flaring asthma, discussed.  Prescribed albuterol  inhaler as well as Tessalon  and Promethazine DM. May use additional over-the-counter medications as needed for supportive care.  May follow-up with urgent care as needed if symptoms persist or worsen.  Note given.   Final Clinical Impressions(s) / UC Diagnoses   Final diagnoses:  Viral illness     Discharge Instructions      Today you are evaluated for your cough and chest discomfort, on exam there is wheezing which is a sign of airway tightness which is most likely why you are having trouble breathing, the discomfort to the center of your chest is most likely due to persistent coughing causing soreness to your muscles  COVID and flu test are negative  Symptoms are most likely related to a virus as there are many germs going around the community, typically with viruses they  fixed themselves within a 10-day window however if your symptoms do not improve you may follow-up for reevaluation  May use albuterol  inhaler taking 2 puffs with feeling short of breath or having persistent coughing  You may use Tessalon  pill every 8 hours for cough, may use cough syrup at bedtime as needed if having difficulty sleep    You can take Tylenol  and/or Ibuprofen  as needed for fever reduction and pain relief.   For cough: honey 1/2 to 1 teaspoon (you can dilute the honey in water or another fluid).  You can also use guaifenesin and dextromethorphan for cough. You can use a humidifier for chest congestion and cough.  If you don't have a humidifier, you can sit in the bathroom with the hot shower running.      For sore throat: try warm salt water gargles, cepacol lozenges, throat spray, warm tea or water with lemon/honey, popsicles or ice, or OTC cold relief medicine for throat discomfort.   For congestion: take a daily anti-histamine like Zyrtec, Claritin , and a oral decongestant, such as pseudoephedrine.  You can also use Flonase  1-2 sprays in each nostril daily.   It is important to stay hydrated: drink plenty of fluids (water, gatorade/powerade/pedialyte, juices, or teas) to keep your throat moisturized and help further relieve irritation/discomfort.    ED Prescriptions     Medication Sig Dispense Auth. Provider   albuterol  (VENTOLIN  HFA) 108 (90  Base) MCG/ACT inhaler Inhale 1-2 puffs into the lungs every 4 (four) hours as needed for wheezing or shortness of breath. 8.5 each Teresa Price R, NP   benzonatate  (TESSALON ) 100 MG capsule Take 1 capsule (100 mg total) by mouth every 8 (eight) hours. 21 capsule Kyley Solow R, NP   promethazine-dextromethorphan (PROMETHAZINE-DM) 6.25-15 MG/5ML syrup Take 5 mLs by mouth at bedtime as needed. 118 mL Vicente Weidler, Price SAUNDERS, NP      PDMP not reviewed this encounter.   Teresa Price SAUNDERS, NP 07/25/24 1347

## 2024-07-26 ENCOUNTER — Encounter: Payer: Self-pay | Admitting: Internal Medicine

## 2024-07-26 ENCOUNTER — Ambulatory Visit: Admitting: Cardiology

## 2024-07-26 ENCOUNTER — Encounter: Payer: Self-pay | Admitting: Cardiology

## 2024-07-26 VITALS — BP 112/64 | HR 72 | Ht 66.0 in | Wt 159.4 lb

## 2024-07-26 DIAGNOSIS — B349 Viral infection, unspecified: Secondary | ICD-10-CM | POA: Insufficient documentation

## 2024-07-26 DIAGNOSIS — J45998 Other asthma: Secondary | ICD-10-CM | POA: Diagnosis not present

## 2024-07-26 DIAGNOSIS — Z0130 Encounter for examination of blood pressure without abnormal findings: Secondary | ICD-10-CM

## 2024-07-26 MED ORDER — BENZONATATE 100 MG PO CAPS: 100.0000 mg | ORAL_CAPSULE | Freq: Three times a day (TID) | ORAL | 0 refills | Status: DC

## 2024-07-26 MED ORDER — PROMETHAZINE-DM 6.25-15 MG/5ML PO SYRP: 5.0000 mL | ORAL_SOLUTION | Freq: Every evening | ORAL | 0 refills | Status: DC | PRN

## 2024-07-26 MED ORDER — ALBUTEROL SULFATE HFA 108 (90 BASE) MCG/ACT IN AERS: 1.0000 | INHALATION_SPRAY | RESPIRATORY_TRACT | 0 refills | Status: DC | PRN

## 2024-07-26 NOTE — Telephone Encounter (Signed)
 Patient has been on- She always gets as a formulary exception. Intolerant to Repatha 

## 2024-07-26 NOTE — Progress Notes (Signed)
 Established Patient Office Visit  Subjective:  Patient ID: Lori [name], female    DOB: 02-08-1966  Age: 58 y.o. MRN: [id number]  Chief Complaint  Patient presents with   Acute Visit    Flu and covid negative still feeling sick and fatigued.  Claims to be allergic to eggs I believe its a allergic reaction to the flu vaccine.     Patient in office for an acute visit. Patient reports receiving the flu, pneumonia and shingles vaccine all at the same time last week. After the flu injection, she started to feel ill. Patient reports she is allergic to eggs, spouse did not notify the pharmacy of her allergy. Patient went to urgent care yesterday, negative for Flu and Covid, given prescriptions for albuterol  inhaler, tessalon  pearls, and promethazine, states the pharmacy did not have any of the medications, will resend. Patient's lungs clear on exam. Recommend taking benadryl for egg allergy, drink plenty of water. Use additional over-the-counter medications as needed for supportive care.    No other concerns at this time.   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/08/2023   Chest pain    normal LV function by echo 10/2010   High cholesterol    History of syncope 07/07/2012   Idioventricular rhythm (HCC)    Kidney stone    Palpitations    Strain of neck muscle 09/08/2023    Past Surgical History:  Procedure Laterality Date   APPENDECTOMY     CESAREAN SECTION     x2   CHOLECYSTECTOMY     neurocardiogenic syncope     PATENT FORAMEN OVALE CLOSURE     SURGERY ON FACE     AFTER FALL   WRIST SURGERY     broken wrist,left    Social History   Socioeconomic History   Marital status: Married    Spouse name: Not on file   Number of children: Not on file   Years of education: Not on file   Highest education level: Not on file  Occupational History   Not on file  Tobacco Use   Smoking status: Former     Current packs/day: 0.00    Average packs/day: 1 pack/day for 5.0 years (5.0 ttl pk-yrs)    Types: Cigarettes    Start date: 02/11/2001    Quit date: 02/11/2006    Years since quitting: 18.4   Smokeless tobacco: Never  Vaping Use   Vaping status: Never Used  Substance and Sexual Activity   Alcohol use: No    Alcohol/week: 0.0 standard drinks of alcohol   Drug use: No   Sexual activity: Not Currently    Partners: Male    Birth control/protection: Post-menopausal  Other Topics Concern   Not on file  Social History Narrative   Not on file   Social Drivers of Health   Financial Resource Strain: Low Risk  (10/28/2019)   Received from [facility]   Overall Financial Resource Strain (CARDIA)    Difficulty of Paying Living Expenses: Not hard at all  Food Insecurity: No Food Insecurity (10/28/2019)   Received from [facility]   Hunger Vital Sign    Within the past 12 months, you worried that your food would run out before you got the money to buy more.: Never true    Within the past 12 months, the food you bought just didn't last and you didn't have  money to get more.: Never true  Transportation Needs: No Transportation Needs (10/28/2019)   Received from [facility] - [facility]    Lack of Transportation (Medical): No    Lack of Transportation (Non-Medical): No  Physical Activity: Sufficiently Active (10/28/2019)   Received from [facility]   Exercise Vital Sign    On average, how many days per week do you engage in moderate to strenuous exercise (like a brisk walk)?: 7 days    On average, how many minutes do you engage in exercise at this level?: 60 min  Stress: No Stress Concern Present (10/28/2019)   Received from [facility] - Occupational Stress Questionnaire    Feeling of Stress : Not at all  Social Connections: Unknown (10/28/2019)   Received from [facility]   Social Connection and Isolation  Panel    In a typical week, how many times do you talk on the phone with family, friends, or neighbors?: More than three times a week    How often do you get together with friends or relatives?: More than three times a week    Attends Religious Services: Not on file    Do you belong to any clubs or organizations such as church groups, unions, fraternal or athletic groups, or school groups?: Yes    Attends Banker Meetings: Not on file    Are you married, widowed, divorced, separated, never married, or living with a partner?: Married  Catering manager Violence: Not on file    Family History  Problem Relation Age of Onset   Hypertension Mother    Heart disease Father 36       MI   Hyperlipidemia Father    Cancer Maternal Aunt    Uterine cancer Maternal Aunt    Breast cancer Neg Hx    Ovarian cancer Neg Hx    Colon cancer Neg Hx     Allergies  Allergen Reactions   Atorvastatin  Swelling   Ciprofloxacin Hcl Other (See Comments)    Heart condition  Lowers blood pressure   Cyclobenzaprine Other (See Comments)    Angina; lowers BP   Latex    Levaquin [Levofloxacin In D5w]     Tremors, nausea   Nortriptyline Other (See Comments)    Angina, lowers BP   Pravastatin  Other (See Comments)    gingivitis   Prednisone Other (See Comments)    Has heart condition causes BP to gt too low.   Sulfamethizole Hives   Repatha  [Evolocumab ] Other (See Comments)    myalgias   Naproxen Rash    Outpatient Medications Prior to Visit  Medication Sig   Alirocumab  (PRALUENT ) 75 MG/ML SOAJ Inject 1 mL (75 mg total) into the skin every 14 (fourteen) days.   [DISCONTINUED] albuterol  (VENTOLIN  HFA) 108 (90 Base) MCG/ACT inhaler Inhale 1-2 puffs into the lungs every 4 (four) hours as needed for wheezing or shortness of breath.   [DISCONTINUED] benzonatate  (TESSALON ) 100 MG capsule Take 1 capsule (100 mg total) by mouth every 8 (eight) hours.   [DISCONTINUED] promethazine-dextromethorphan  (PROMETHAZINE-DM) 6.25-15 MG/5ML syrup Take 5 mLs by mouth at bedtime as needed.   No facility-administered medications prior to visit.    Review of Systems  Constitutional:  Positive for malaise/fatigue.  HENT: Negative.    Eyes: Negative.   Respiratory:  Positive for cough. Negative for shortness of breath.   Cardiovascular:  Positive for chest pain.  Gastrointestinal:  Negative.  Negative for abdominal pain, constipation and diarrhea.  Genitourinary: Negative.   Musculoskeletal:  Negative for joint pain and myalgias.  Skin: Negative.   Neurological: Negative.  Negative for dizziness and headaches.  Endo/Heme/Allergies: Negative.   All other systems reviewed and are negative.      Objective:   BP 112/64   Pulse 72   Ht 5' 6 (1.676 m)   Wt 159 lb 6.4 oz (72.3 kg)   LMP 11/19/2015   SpO2 98%   BMI 25.73 kg/m   Vitals:   07/26/24 1114  BP: 112/64  Pulse: 72  Height: 5' 6 (1.676 m)  Weight: 159 lb 6.4 oz (72.3 kg)  SpO2: 98%  BMI (Calculated): 25.74    Physical Exam Vitals and nursing note reviewed.  Constitutional:      Appearance: Normal appearance. She is normal weight.  HENT:     Head: Normocephalic and atraumatic.     Nose: Nose normal.     Mouth/Throat:     Mouth: Mucous membranes are moist.  Eyes:     Extraocular Movements: Extraocular movements intact.     Conjunctiva/sclera: Conjunctivae normal.     Pupils: Pupils are equal, round, and reactive to light.  Cardiovascular:     Rate and Rhythm: Normal rate and regular rhythm.     Pulses: Normal pulses.     Heart sounds: Normal heart sounds.  Pulmonary:     Effort: Pulmonary effort is normal.     Breath sounds: Normal breath sounds.  Abdominal:     General: Abdomen is flat. Bowel sounds are normal.     Palpations: Abdomen is soft.  Musculoskeletal:        General: Normal range of motion.     Cervical back: Normal range of motion.  Skin:    General: Skin is warm and dry.  Neurological:      General: No focal deficit present.     Mental Status: She is alert and oriented to person, place, and time.  Psychiatric:        Mood and Affect: Mood normal.        Behavior: Behavior normal.        Thought Content: Thought content normal.        Judgment: Judgment normal.      No results found for any visits on 07/26/24.  Recent Results (from the past 2160 hours)  Heavy Metals Profile II, Blood     Status: None   Collection Time: 05/25/24 10:49 AM  Result Value Ref Range   Lead, Blood <1.0 0.0 - 3.4 ug/dL    Comment: Testing performed by Inductively coupled Administrator, [employer].                           Environmental Exposure:                            WHO Recommendation     <5.0                           Occupational Exposure:                            OSHA Lead Std          40.0  BEI                    30.0                                 Detection Limit =  1.0    Arsenic 2 0 - 9 ug/L    Comment:                                 Detection Limit = 1   Mercury 1.7 0.0 - 14.9 ug/L    Comment:                                 Detection Limit =  1.0   Cadmium <0.5 0.0 - 1.2 ug/L    Comment:                            Environmental Exposure:                             Nonsmokers      0.3 - 1.2                             Smokers         0.6 - 3.9                            Occupational Exposure:                             OSHA Cadmium Std      5.0                             BEI                   5.0                                 Detection Limit = 0.5   Total Glutathione     Status: None   Collection Time: 05/25/24 10:49 AM  Result Value Ref Range   Total Glutathione 308 176 - 323 ug/mL    Comment: Results of this test are for Investigational Purposes Only.  The performance characteristics of this assay have been determined by [facility].  The result should not be used as a diagnostic procedure without confirmation of the diagnosis by  another medically established diagnostic product or procedure.   Lipid Profile     Status: Abnormal   Collection Time: 05/25/24 10:49 AM  Result Value Ref Range   Cholesterol, Total 302 (H) 100 - 199 mg/dL   Triglycerides 831 (H) 0 - 149 mg/dL   HDL 62 >60 mg/dL   VLDL Cholesterol Cal 31 5 - 40 mg/dL   LDL Chol Calc (NIH) 790 (H) 0 - 99 mg/dL   LDL CALC COMMENT: Comment     Comment: Consider evaluating for Familial Hypercholesterolemia(FH), if clinically indicated.    Chol/HDL Ratio 4.9 (H)  0.0 - 4.4 ratio    Comment:                                   T. Chol/HDL Ratio                                             Men  Women                               1/2 Avg.Risk  3.4    3.3                                   Avg.Risk  5.0    4.4                                2X Avg.Risk  9.6    7.1                                3X Avg.Risk 23.4   11.0   Compliance Drug Analysis, Ur     Status: None   Collection Time: 05/25/24 10:53 AM  Result Value Ref Range   Summary FINAL     Comment: ==================================================================== Compliance Drug Analysis, Ur ==================================================================== Test                             Result       Flag       Units    NO DRUGS DETECTED. ==================================================================== Test                      Result    Flag   Units      Ref Range   Creatinine              87               mg/dL      >=79 ==================================================================== Declared Medications:  Medication list was not provided. ==================================================================== For clinical consultation, please call [phone]. ====================================================================   Comprehensive metabolic panel with GFR     Status: Abnormal   Collection Time: 07/09/24  9:45 AM  Result Value Ref Range   Glucose 100 (H) 70 - 99  mg/dL   BUN 15 6 - 24 mg/dL   Creatinine, Ser 9.25 0.57 - 1.00 mg/dL   eGFR 93 >40 fO/fpw/8.26   BUN/Creatinine Ratio 20 9 - 23   Sodium 144 134 - 144 mmol/L   Potassium 4.8 3.5 - 5.2 mmol/L   Chloride 106 96 - 106 mmol/L   CO2 23 20 - 29 mmol/L   Calcium  9.1 8.7 - 10.2 mg/dL   Total Protein 7.1 6.0 - 8.5 g/dL   Albumin 4.5 3.8 - 4.9 g/dL   Globulin, Total 2.6 1.5 - 4.5 g/dL   Bilirubin Total 0.3 0.0 - 1.2 mg/dL   Alkaline Phosphatase 89 44 - 121 IU/L    Comment: **Effective July 12, 2024 Alkaline Phosphatase**   reference interval will be changing to:  Age                Female          Female           0 -  5 days         47 - 127       47 - 127           6 - 10 days         29 - 242       29 - 242          11 - 20 days        109 - 357      109 - 357          21 - 30 days         94 - 494       94 - 494           1 -  2 months      149 - 539      149 - 539           3 -  6 months      131 - 452      131 - 452           7 - 11 months      117 - 401      117 - 401   12 months -  6 years       158 - 369      158 - 369           7 - 12 years       150 - 409      150 - 409               13 years       156 - 435       78 - 227               14 years       114 - 375       64 - 161               15 years        88 - 279       56 - 134               16 years        74 - 207       51 - 121               17 years        63 - 161       47 - 113          18 - 20 years        51 - 125       42 - 106          21 - 50 years         47 - 123       41 - 116          51 - 80 years        49 - 135       51 - 125              >80 years  48 - 129       48 - 129    AST 17 0 - 40 IU/L   ALT 17 0 - 32 IU/L  POC Covid19/Flu A&B Antigen     Status: None   Collection Time: 07/25/24  1:02 PM  Result Value Ref Range   Influenza A Antigen, POC Negative Negative   Influenza B Antigen, POC Negative Negative   Covid Antigen, POC Negative Negative      Assessment & Plan:   Benadryl for egg allergy Medications as prescribed Use additional over-the-counter medications as needed for supportive care.  Drink plenty of water  Problem List Items Addressed This Visit       Respiratory   Other asthma   Relevant Medications   albuterol  (VENTOLIN  HFA) 108 (90 Base) MCG/ACT inhaler     Other   Viral illness - Primary    Return if symptoms worsen or fail to improve.   Total time spent: 25 minutes  Google, NP  07/26/2024   This document may have been prepared by Dragon Voice Recognition software and as such may include unintentional dictation errors.

## 2024-07-26 NOTE — Telephone Encounter (Signed)
 Fax from plan, Praluent  is not a covered benefit (see chart media)

## 2024-07-26 NOTE — Telephone Encounter (Signed)
 More information sent to insurance earlier today and sent appeal letter 07/27/24

## 2024-07-28 ENCOUNTER — Ambulatory Visit: Payer: Self-pay | Admitting: Internal Medicine

## 2024-07-28 ENCOUNTER — Other Ambulatory Visit
Admission: RE | Admit: 2024-07-28 | Discharge: 2024-07-28 | Disposition: A | Discharge status: Home / Self Care | Source: Ambulatory Visit | Attending: Internal Medicine | Admitting: Internal Medicine

## 2024-07-28 ENCOUNTER — Encounter: Payer: Self-pay | Admitting: Internal Medicine

## 2024-07-28 ENCOUNTER — Ambulatory Visit: Attending: Internal Medicine | Admitting: Internal Medicine

## 2024-07-28 VITALS — BP 108/70 | HR 68 | Ht 66.0 in | Wt 162.1 lb

## 2024-07-28 DIAGNOSIS — G901 Familial dysautonomia [Riley-Day]: Secondary | ICD-10-CM

## 2024-07-28 DIAGNOSIS — R079 Chest pain, unspecified: Secondary | ICD-10-CM | POA: Diagnosis present

## 2024-07-28 DIAGNOSIS — R0602 Shortness of breath: Secondary | ICD-10-CM | POA: Diagnosis not present

## 2024-07-28 DIAGNOSIS — E782 Mixed hyperlipidemia: Secondary | ICD-10-CM

## 2024-07-28 DIAGNOSIS — R072 Precordial pain: Secondary | ICD-10-CM | POA: Diagnosis not present

## 2024-07-28 LAB — CBC WITH DIFFERENTIAL/PLATELET
Abs Immature Granulocytes: 0.03 K/uL (ref 0.00–0.07)
Basophils Absolute: 0 K/uL (ref 0.0–0.1)
Basophils Relative: 0 %
Eosinophils Absolute: 0.1 K/uL (ref 0.0–0.5)
Eosinophils Relative: 1 %
HCT: 41.8 % (ref 36.0–46.0)
Hemoglobin: 13.8 g/dL (ref 12.0–15.0)
Immature Granulocytes: 0 %
Lymphocytes Relative: 36 %
Lymphs Abs: 2.7 K/uL (ref 0.7–4.0)
MCH: 28.7 pg (ref 26.0–34.0)
MCHC: 33 g/dL (ref 30.0–36.0)
MCV: 86.9 fL (ref 80.0–100.0)
Monocytes Absolute: 0.4 K/uL (ref 0.1–1.0)
Monocytes Relative: 6 %
Neutro Abs: 4.2 K/uL (ref 1.7–7.7)
Neutrophils Relative %: 57 %
Platelets: 213 K/uL (ref 150–400)
RBC: 4.81 MIL/uL (ref 3.87–5.11)
RDW: 12.8 % (ref 11.5–15.5)
WBC: 7.5 K/uL (ref 4.0–10.5)
nRBC: 0 % (ref 0.0–0.2)

## 2024-07-28 LAB — BASIC METABOLIC PANEL WITH GFR
Anion gap: 7 (ref 5–15)
BUN: 19 mg/dL (ref 6–20)
CO2: 25 mmol/L (ref 22–32)
Calcium: 8.6 mg/dL — ABNORMAL LOW (ref 8.9–10.3)
Chloride: 107 mmol/L (ref 98–111)
Creatinine, Ser: 0.7 mg/dL (ref 0.44–1.00)
GFR, Estimated: >60 mL/min (ref >60)
Glucose, Bld: 89 mg/dL (ref 70–99)
Potassium: 4.2 mmol/L (ref 3.5–5.1)
Sodium: 139 mmol/L (ref 135–145)

## 2024-07-28 LAB — TROPONIN I (HIGH SENSITIVITY): Troponin I (High Sensitivity): 2 ng/L (ref <18)

## 2024-07-28 MED ORDER — METOPROLOL TARTRATE 25 MG PO TABS: ORAL_TABLET | ORAL | 0 refills | Status: DC

## 2024-07-28 NOTE — Patient Instructions (Signed)
 Medication Instructions:  Your physician recommends that you continue on your current medications as directed. Please refer to the Current Medication list given to you today.    *If you need a refill on your cardiac medications before your next appointment, please call your pharmacy*  Lab Work: Your provider would like for you to have following labs drawn today CBC, BMP, Troponin .     Testing/Procedures: Your physician has requested that you have an echocardiogram. Echocardiography is a painless test that uses sound waves to create images of your heart. It provides your doctor with information about the size and shape of your heart and how well your heart's chambers and valves are working.   You may receive an ultrasound enhancing agent through an IV if needed to better visualize your heart during the echo. This procedure takes approximately one hour.  There are no restrictions for this procedure.  This will take place at [address] [facility], [place] [address]  Please note: We ask at that you not bring children with you during ultrasound (echo/ vascular) testing. Due to room size and safety concerns, children are not allowed in the ultrasound rooms during exams. Our front office staff cannot provide observation of children in our lobby area while testing is being conducted. An adult accompanying a patient to their appointment will only be allowed in the ultrasound room at the discretion of the ultrasound technician under special circumstances. We apologize for any inconvenience.     Your cardiac CT will be scheduled at one of the below locations:   [facility] [address] [place], [place] [address] [phone]  If scheduled at [facility] or [facility], please arrive 15 mins early for check-in and test prep.  There is spacious parking and easy access to the radiology department  from the [facility] entrance. Please enter here and check-in with the desk attendant.   Please follow these instructions carefully (unless otherwise directed):  An IV will be required for this test and Nitroglycerin  will be given.   On the Night Before the Test: Be sure to Drink plenty of water. Do not consume any caffeinated/decaffeinated beverages or chocolate 12 hours prior to your test. Do not take any antihistamines 12 hours prior to your test.  On the Day of the Test: Drink plenty of water until 1 hour prior to the test. Do not eat any food 1 hour prior to test. You may take your regular medications prior to the test.  Take metoprolol  (Lopressor ) 25 mg two hours prior to test. Patients who wear a continuous glucose monitor MUST remove the device prior to scanning. FEMALES- please wear underwire-free bra if available, avoid dresses & tight clothing        After the Test: Drink plenty of water. After receiving IV contrast, you may experience a mild flushed feeling. This is normal. On occasion, you may experience a mild rash up to 24 hours after the test. This is not dangerous. If this occurs, you can take Benadryl 25 mg, Zyrtec, Claritin , or Allegra and increase your fluid intake. (Patients taking Tikosyn should avoid Benadryl, and may take Zyrtec, Claritin , or Allegra) If you experience trouble breathing, this can be serious. If it is severe call 911 IMMEDIATELY. If it is mild, please call our office.  We will call to schedule your test 2-4 weeks out understanding that some insurance companies will need an authorization prior to  the service being performed.   For more information and frequently asked questions, please visit our website : [email or web address]  For non-scheduling related questions, please contact the cardiac imaging nurse navigator should you have any questions/concerns: Cardiac Imaging Nurse Navigators Direct Office Dial: [phone]    For scheduling needs, including cancellations and rescheduling, please call [place], [phone].   Follow-Up: At [facility], you and your health needs are our priority.  As part of our continuing mission to provide you with exceptional heart care, our providers are all part of one team.  This team includes your primary Cardiologist (physician) and Advanced Practice Providers or APPs (Physician Assistants and Nurse Practitioners) who all work together to provide you with the care you need, when you need it.  Your next appointment:   2 month(s)  Provider:   You may see Lonni Hanson, MD or one of the following Advanced Practice Providers on your designated Care Team:   Lonni Meager, NP Lesley Maffucci, PA-C Bernardino Bring, PA-C Cadence Ivins, PA-C Tylene Lunch, NP Barnie Hila, NP

## 2024-07-28 NOTE — Telephone Encounter (Signed)
 Pharmacy Patient Advocate Encounter  Received notification from [facility] ** that Prior Authorization for praluent  has been APPROVED from 07/27/24 to 07/27/25

## 2024-07-28 NOTE — Progress Notes (Signed)
 Cardiology Office Note:  .   Date:  07/28/2024  ID:  Shasta Buys, DOB 08-Mar-1966, MRN [id number] PCP: Orlean Alan HERO, FNP  [facility] Providers Cardiologist:  Lonni Hanson, MD Electrophysiologist:  Elspeth Sage, MD     History of Present Illness: .   Margaretmary Prisk is a 58 y.o. female with history of HFpEF, pericarditis, dysautonomia, PVCs, hyperlipidemia, asthma, and uterine cancer, who presents for urgent evaluation of chest pain.  History is obtained with assistance of Spanish interpreter.  I just saw her over 2 weeks ago for routine follow-up.  She noted that she had been out of Praluent  for 6 months.  We have been working with her insurance to get this approved, as she has not tolerated any other lipid lowering agents including multiple statins and Repatha .  She reached out to our office last week complaining about chest pain and shortness of breath since receiving flu, shingles, and pneumonia vaccines.  Ms. Jaquith reports that she almost immediately felt a burning sensation throughout her body when she received the vaccines.  About 20 minutes later, she developed a burning pain in her chest with associated shortness of breath.  She has felt very fatigued since then and has continued to have intermittent episodes of chest pain and shortness of breath, including with exertion.  Today, she is without frank chest pain but still has some vague tightness around her neck.  She tried taking some aspirin  without much improvement.  She also notes some dizziness that she describes as vertigo with accompanying nausea.  She has not had any edema or palpitations.  Chronic intermittent lightheadedness is stable.  After onset of symptoms, Ms. Poss called 911 and was evaluated by EMS.  She declined transport to the ED but was seen at urgent care on 07/25/2024, where influenza and COVID-19 antibody tests were both negative.  ROS: See HPI  Studies Reviewed: SABRA   EKG  Interpretation Date/Time:  Wednesday July 28 2024 11:38:30 EDT Ventricular Rate:  68 PR Interval:  160 QRS Duration:  68 QT Interval:  368 QTC Calculation: 391 R Axis:   2  Text Interpretation: Normal sinus rhythm Low voltage QRS Borderline ECG When compared with ECG of 09-Jul-2024 09:06, No significant change was found Confirmed by Ilissa Rosner, Lonni [phone]) on 07/28/2024 11:48:28 AM    Risk Assessment/Calculations:             Physical Exam:   VS:  BP 108/70 (BP Location: Left Arm, Patient Position: Sitting, Cuff Size: Normal)   Pulse 68   Ht 5' 6 (1.676 m)   Wt 162 lb 2 oz (73.5 kg)   LMP 11/19/2015   SpO2 97%   BMI 26.17 kg/m    Wt Readings from Last 3 Encounters:  07/28/24 162 lb 2 oz (73.5 kg)  07/26/24 159 lb 6.4 oz (72.3 kg)  07/09/24 159 lb 8 oz (72.3 kg)    General:  NAD. Neck: No JVD or HJR. Lungs: Clear to auscultation bilaterally without wheezes or crackles. Heart: Regular rate and rhythm without murmurs, rubs, or gallops. Abdomen: Soft, nontender, nondistended. Extremities: No lower extremity edema.  ASSESSMENT AND PLAN: .    Chest pain and shortness of breath: Symptoms began almost immediately after receiving multiple vaccines.  Leading up to that, Ms. Bloodgood has been feeling very well without any chest pain or shortness of breath.  I suspect she may have had some sort of reaction to the vaccines leading to her symptoms, though persistence is somewhat concerning.  I will send her to the lab for a stat CBC, BMP, and high-sensitivity troponin I.  I advised her that she will need to go to the ER if her troponin is elevated or if her symptoms worsen again.  If lab workup is reassuring, we will obtain an expedited echocardiogram as well as a coronary CTA.  Defer medication changes at this time.  Dysautonomia: Recent dizziness is different than her typical lightheadedness.  Seed with echocardiogram and coronary CTA as well as lab evaluation outlined above.  No  medication changes at this time.  Hyperlipidemia: We are still working on receiving authorization for Praluent , given that Ms. Perren has been intolerant of multiple statins as well as Repatha  and has significantly elevated LDL.     Dispo: Return to clinic after echocardiogram and cardiac CTA.  Signed, Lonni Hanson, MD

## 2024-08-02 ENCOUNTER — Other Ambulatory Visit (HOSPITAL_COMMUNITY): Payer: Self-pay

## 2024-08-02 ENCOUNTER — Telehealth: Payer: Self-pay | Admitting: Pharmacy Technician

## 2024-08-02 NOTE — Telephone Encounter (Signed)
 Patient said her praluent  was 822.00  I called [facility] they said she paid 50.00 and got yesterday  That as cheap as it can get even w coupon

## 2024-08-06 ENCOUNTER — Telehealth (HOSPITAL_COMMUNITY): Payer: Self-pay | Admitting: Emergency Medicine

## 2024-08-06 NOTE — Telephone Encounter (Signed)
 Attempted to call patient regarding upcoming cardiac CT appointment. Left message on voicemail with name and callback number Rockwell Alexandria RN Navigator Cardiac Imaging [facility] [phone] Office [phone] Cell

## 2024-08-09 ENCOUNTER — Ambulatory Visit
Admission: RE | Admit: 2024-08-09 | Discharge: 2024-08-09 | Disposition: A | Discharge status: Home / Self Care | Source: Ambulatory Visit | Attending: Internal Medicine | Admitting: Internal Medicine

## 2024-08-09 DIAGNOSIS — R072 Precordial pain: Secondary | ICD-10-CM | POA: Insufficient documentation

## 2024-08-09 DIAGNOSIS — R0602 Shortness of breath: Secondary | ICD-10-CM | POA: Insufficient documentation

## 2024-08-09 MED ORDER — NITROGLYCERIN 0.4 MG SL SUBL
0.8000 mg | SUBLINGUAL_TABLET | Freq: Once | SUBLINGUAL | Status: AC
Start: 2024-08-09 — End: 2024-08-09
  Administered 2024-08-09: 0.8 mg via SUBLINGUAL
  Filled 2024-08-09: qty 25

## 2024-08-09 MED ORDER — IOHEXOL 350 MG/ML SOLN
100.0000 mL | Freq: Once | INTRAVENOUS | Status: AC | PRN
  Administered 2024-08-09: 100 mL via INTRAVENOUS

## 2024-08-18 ENCOUNTER — Encounter: Payer: Self-pay | Admitting: Family

## 2024-08-19 ENCOUNTER — Other Ambulatory Visit: Payer: Self-pay

## 2024-08-19 MED ORDER — FLUTICASONE PROPIONATE 50 MCG/ACT NA SUSP: 1.0000 | Freq: Every day | NASAL | 2 refills | Status: AC

## 2024-09-10 ENCOUNTER — Ambulatory Visit: Admitting: Cardiology

## 2024-09-10 ENCOUNTER — Ambulatory Visit: Attending: Internal Medicine

## 2024-09-10 DIAGNOSIS — R0602 Shortness of breath: Secondary | ICD-10-CM

## 2024-09-10 DIAGNOSIS — R072 Precordial pain: Secondary | ICD-10-CM

## 2024-09-10 LAB — ECHOCARDIOGRAM COMPLETE
AR max vel: 1.57 cm2
AV Area VTI: 1.69 cm2
AV Area mean vel: 1.65 cm2
AV Mean grad: 4 mmHg
AV Peak grad: 7.6 mmHg
Ao pk vel: 1.38 m/s
Area-P 1/2: 3.99 cm2
S' Lateral: 2.8 cm

## 2024-09-15 ENCOUNTER — Ambulatory Visit: Admitting: Cardiology

## 2024-09-15 ENCOUNTER — Encounter: Payer: Self-pay | Admitting: Cardiology

## 2024-09-15 VITALS — BP 112/68 | HR 79 | Ht 66.0 in | Wt 163.4 lb

## 2024-09-15 NOTE — Progress Notes (Unsigned)
 This encounter was created in error - please disregard.

## 2024-09-15 NOTE — Progress Notes (Deleted)
 Cardiology Office Note   Date:  09/15/2024  ID:  Lori [name], DOB 1966-04-25, MRN [id number] PCP: Orlean Alan HERO, FNP  [facility] Providers Cardiologist:  Lonni Hanson, MD Electrophysiologist:  Elspeth Sage, MD (Inactive) { Click to update primary MD,subspecialty MD or APP then REFRESH:1}    History of Present Illness Lori [name] is a 58 y.o. female with a past medical history of HFpEF, pericarditis, dysautonomia, PVCs, hyperlipidemia, asthma, and uterine cancer who presents today for follow-up.   She had previously been seen in 2023 by Dr. Hanson for evaluation of chest pain.  She is primarily followed by Dr. Sage.  Her chest pain was felt to be due to costochondritis.  NSAIDs were recommended.  She had seen Dr. Sage a couple of weeks after that visit in 2023 and noted some orthostatic lightheadedness as well as mild shortness of breath in the setting of having been diagnosed with COVID-19.  There were no medication changes made or additional testing that were pursued at that time.  She was seen in clinic 07/09/2024 by Dr. Hanson.  She was concerned that she had been out of her Praluent  for at least 6 months being unable to get it refilled for prescription.  She had been tolerating this well with significant improvement in her LDL.  She tried multiple statins as well as Repatha  in the past and did not tolerate them due to side effects/allergic reactions.  From a cardiac standpoint she continued to have episodes of tachypalpitations and lightheadedness as well as sporadic syncope.  She last passed out approximately 2 weeks prior to her appointment.  It been extremely hot outside and she noticed that her heart began racing.  She then became profoundly weak and believes she was unconscious for approximately 5 minutes.  This was similar to episodes that she had experienced in the past.  She stated that she stayed well-hydrated and had not had any chest pain or shortness of breath.  She noted  occasional lower extremity edema but been well-controlled with compression stockings.  She was advised to avoid driving for 6 months from her most recent syncopal episode and her handicap placard was renewed.  Her LDL most recently was 209 in July after being off Praluent  for several months.  She was sent for CMP and the plan was to resume prior and then assuming her LFTs would be normal.  She was seen in clinic 07/28/2024 by Dr. Hanson after reaching out to the office complaining about chest pain and shortness of breath since receiving flu, shingles, and pneumonia vaccines.  She reports she almost immediately felt a burning sensation throughout her body when she received her vaccines.  Approximately 20 minutes later she developed a burning pain in her chest with associated shortness of breath.  She felt very fatigued since then and continued to have intermittent episodes of chest pain or shortness of breath.  She was sent for labs of CBC, BMP, high-sensitivity troponin.  She was advised that if her troponin was positive she would need to present to the emergency department she was also scheduled for an updated echocardiogram and a coronary CTA.   She returns to clinic today  ROS: 10 point review of systems has been reviewed and considered negative the exception was been listed in the HPI  Studies Reviewed EKG Interpretation Date/Time:  Wednesday September 15 2024 14:30:19 EST Ventricular Rate:  79 PR Interval:  130 QRS Duration:  66 QT Interval:  378 QTC Calculation: 433 R Axis:  12  Text Interpretation: Normal sinus rhythm Low voltage QRS Nonspecific T wave abnormality When compared with ECG of 28-Jul-2024 11:38, No significant change since last tracing Confirmed by Gerard Frederick ([id number]) on 09/15/2024 2:34:28 PM    2d echo 09/10/2024 1. Left ventricular ejection fraction, by estimation, is 55 to 60%. Left  ventricular ejection fraction by 3D volume is 64 %. The left ventricle has  normal  function. Left ventricular endocardial border not optimally defined  to evaluate regional wall  motion. Left ventricular diastolic parameters were normal. The global  longitudinal strain is normal.   2. Right ventricular systolic function is normal. The right ventricular  size is normal.   3. The mitral valve is normal in structure. Trivial mitral valve  regurgitation. No evidence of mitral stenosis.   4. The aortic valve is tricuspid. Aortic valve regurgitation is not  visualized. No aortic stenosis is present.   5. The inferior vena cava is normal in size with greater than 50%  respiratory variability, suggesting right atrial pressure of 3 mmHg.   cCTA 08/09/2024 IMPRESSION: 1. Coronary calcium  score of 0.   2. Normal coronary origin with right dominance.   3. Normal coronary arteries.   4. 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   RECOMMENDATIONS: CAD-RADS 0: No evidence of CAD (0%). Consider non-atherosclerotic causes of chest pain.  Risk Assessment/Calculations {Does this patient have ATRIAL FIBRILLATION?:[phone]}         Physical Exam VS:  BP 112/68 (BP Location: Left Arm, Patient Position: Sitting, Cuff Size: Normal)   Pulse 79   Ht 5' 6 (1.676 m)   Wt 163 lb 6.4 oz (74.1 kg)   LMP 11/19/2015   SpO2 99%   BMI 26.37 kg/m        Wt Readings from Last 3 Encounters:  09/15/24 163 lb 6.4 oz (74.1 kg)  07/28/24 162 lb 2 oz (73.5 kg)  07/26/24 159 lb 6.4 oz (72.3 kg)    GEN: Well nourished, well developed in no acute distress NECK: No JVD; No carotid bruits CARDIAC: ***RRR, no murmurs, rubs, gallops RESPIRATORY:  Clear to auscultation without rales, wheezing or rhonchi  ABDOMEN: Soft, non-tender, non-distended EXTREMITIES:  No edema; No deformity   ASSESSMENT AND PLAN Chest pain and shortness of breath Dysautonomia Hyperlipidemia History of syncope    {Are you ordering a CV Procedure (e.g. stress test, cath, DCCV, TEE, etc)?   Press F2         :[id number]}  Dispo: ***  Signed, Trevion Hoben, NP

## 2024-09-16 NOTE — Progress Notes (Deleted)
 This encounter was created in error - please disregard.

## 2024-09-17 NOTE — Progress Notes (Signed)
 Order(s) created erroneously. Erroneous order ID: [id number]  Order moved by: CHART CORRECTION ANALSYST SIXTEEN, IDENTITY  Order move date/time: 09/17/2024 1:55 PM  Source Patient: [id number]  Source Contact: 09/15/2024  Destination Patient: [id number]  Destination Contact: 04/09/2023

## 2024-09-27 ENCOUNTER — Ambulatory Visit: Admitting: Cardiology

## 2024-09-29 ENCOUNTER — Encounter: Payer: Self-pay | Admitting: Internal Medicine

## 2024-09-29 ENCOUNTER — Ambulatory Visit: Attending: Internal Medicine | Admitting: Internal Medicine

## 2024-09-29 VITALS — BP 130/70 | HR 70 | Ht 66.0 in | Wt 168.2 lb

## 2024-09-29 DIAGNOSIS — G901 Familial dysautonomia [Riley-Day]: Secondary | ICD-10-CM

## 2024-09-29 DIAGNOSIS — Z79899 Other long term (current) drug therapy: Secondary | ICD-10-CM

## 2024-09-29 DIAGNOSIS — E7849 Other hyperlipidemia: Secondary | ICD-10-CM

## 2024-09-29 DIAGNOSIS — R079 Chest pain, unspecified: Secondary | ICD-10-CM | POA: Diagnosis not present

## 2024-09-29 NOTE — Progress Notes (Signed)
  Cardiology Office Note:  .   Date:  09/29/2024  ID:  Lori [name], DOB Apr 25, 1966, MRN [id number] PCP: Orlean Alan HERO, FNP  [facility] Providers Cardiologist:  Lonni Hanson, MD Electrophysiologist:  Elspeth Sage, MD (Inactive)     History of Present Illness: .   Lori [name] is a 58 y.o. female with history of HFpEF, pericarditis, dysautonomia, PVCs, hyperlipidemia, asthma, and uterine cancer, who presents for follow-up of chest pain, hyperlipidemia, and dysautonomia.  I saw her in early October, at which time she had numerous complaints including chest pain that seem to have been precipitated by receiving concurrent influenza, shingles, and pneumonia vaccines.  ED evaluation was unremarkable.  She continued to complain of atypical chest pain, prompting us  to perform a coronary CTA and echocardiogram.  CTA showed short segment of superficial myocardial bridging but no significant CAD.  Echo was also unremarkable.  Today, Lori [name] reports that she is feeling very well.  She denies any further chest pain as well as shortness of breath, palpitations, and lightheadedness.  Her Praluent  was reauthorized; she has taken 2 shots thus far and is tolerating them well.  ROS: See HPI  Studies Reviewed: SABRA       TTE (09/10/2024): Normal LV size and wall thickness.  LVEF 55-60% with normal diastolic parameters.  Normal RV size and function.  Mild right atrial enlargement.  No significant valvular abnormalities.  Normal CVP.  Coronary CTA (08/09/2024): Short segment of superficial myocardial bridging involving the mid LAD.  Otherwise, normal coronaries.  Coronary calcium  score 0.  No significant extracardiac findings.  Risk Assessment/Calculations:             Physical Exam:   VS:  BP 130/70 (BP Location: Left Arm, Patient Position: Sitting, Cuff Size: Normal)   Pulse 70   Ht 5' 6 (1.676 m)   Wt 168 lb 3.2 oz (76.3 kg)   LMP 11/19/2015   SpO2 99%   BMI 27.15 kg/m    Wt Readings  from Last 3 Encounters:  09/29/24 168 lb 3.2 oz (76.3 kg)  09/15/24 163 lb 6.4 oz (74.1 kg)  07/28/24 162 lb 2 oz (73.5 kg)    General:  NAD. Neck: No JVD or HJR. Lungs: Clear to auscultation bilaterally without wheezes or crackles. Heart: Regular rate and rhythm without murmurs, rubs, or gallops. Abdomen: Soft, nontender, nondistended. Extremities: No lower extremity edema.  ASSESSMENT AND PLAN: .    Chest pain: No further chest pain reported.  Coronary CTA after last visit with reassuring without any evidence of coronary artery disease.  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  No further workup recommended at this time.  Heterozygous familial hyperlipidemia: Lori [name] resumed Praluent  about a month ago and is tolerating this well.  We will plan to repeat a lipid panel and ALT in about 2 months to reassess response.  Dysautonomia: No recent episodes of lightheadedness.  I encouraged her to stay well-hydrated.  Echocardiogram reassuring with normal biventricular function and only trivial tricuspid and pulmonic valve regurgitation.  No further testing/intervention recommended at this time.    Dispo: Return to clinic in 1 year.  Signed, Lonni Hanson, MD

## 2024-09-29 NOTE — Patient Instructions (Signed)
 Medication Instructions:  Your physician recommends that you continue on your current medications as directed. Please refer to the Current Medication list given to you today.    *If you need a refill on your cardiac medications before your next appointment, please call your pharmacy*  Lab Work: Your provider would like for you to return in 2 months to have the following labs drawn: Lipid, ALT.   Please go to [facility] [address] [address], [place] [address] You do not need an appointment.  They are open from 8 am- 4:30 pm.  Lunch from 1:00 pm- 2:00 pm You will need to be fasting.    Testing/Procedures: No test ordered today   Follow-Up: At [facility], you and your health needs are our priority.  As part of our continuing mission to provide you with exceptional heart care, our providers are all part of one team.  This team includes your primary Cardiologist (physician) and Advanced Practice Providers or APPs (Physician Assistants and Nurse Practitioners) who all work together to provide you with the care you need, when you need it.  Your next appointment:   1 year(s)  Provider:   You may see Lonni Hanson, MD or one of the following Advanced Practice Providers on your designated Care Team:   Lonni Meager, NP Lesley Maffucci, PA-C Bernardino Bring, PA-C Cadence [place], PA-C Tylene Lunch, NP Barnie Hila, NP

## 2024-10-11 ENCOUNTER — Encounter: Payer: Self-pay | Admitting: Family

## 2024-10-12 ENCOUNTER — Ambulatory Visit: Admitting: Family

## 2024-10-12 DIAGNOSIS — R300 Dysuria: Secondary | ICD-10-CM

## 2024-10-12 LAB — POCT URINALYSIS DIPSTICK
Bilirubin, UA: NEGATIVE
Glucose, UA: NEGATIVE
Leukocytes, UA: NEGATIVE
Nitrite, UA: NEGATIVE
Protein, UA: NEGATIVE
Spec Grav, UA: 1.025 (ref 1.010–1.025)
Urobilinogen, UA: 0.2 U/dL
pH, UA: 5.5 (ref 5.0–8.0)

## 2024-10-14 ENCOUNTER — Encounter: Payer: Self-pay | Admitting: Family

## 2024-10-15 ENCOUNTER — Ambulatory Visit

## 2024-10-20 ENCOUNTER — Ambulatory Visit: Admitting: Family

## 2024-10-20 ENCOUNTER — Encounter: Payer: Self-pay | Admitting: Family

## 2024-10-20 ENCOUNTER — Ambulatory Visit: Payer: Self-pay

## 2024-10-20 VITALS — BP 124/78 | HR 84 | Ht 66.0 in | Wt 161.0 lb

## 2024-10-20 DIAGNOSIS — Z1211 Encounter for screening for malignant neoplasm of colon: Secondary | ICD-10-CM | POA: Diagnosis not present

## 2024-10-20 DIAGNOSIS — E782 Mixed hyperlipidemia: Secondary | ICD-10-CM

## 2024-10-20 DIAGNOSIS — E663 Overweight: Secondary | ICD-10-CM

## 2024-10-20 DIAGNOSIS — R7303 Prediabetes: Secondary | ICD-10-CM

## 2024-10-20 DIAGNOSIS — R5382 Chronic fatigue, unspecified: Secondary | ICD-10-CM | POA: Diagnosis not present

## 2024-10-20 DIAGNOSIS — N951 Menopausal and female climacteric states: Secondary | ICD-10-CM

## 2024-10-20 DIAGNOSIS — Z1231 Encounter for screening mammogram for malignant neoplasm of breast: Secondary | ICD-10-CM

## 2024-10-20 DIAGNOSIS — N760 Acute vaginitis: Secondary | ICD-10-CM

## 2024-10-20 DIAGNOSIS — R300 Dysuria: Secondary | ICD-10-CM

## 2024-10-20 DIAGNOSIS — G901 Familial dysautonomia [Riley-Day]: Secondary | ICD-10-CM

## 2024-10-20 DIAGNOSIS — Z0130 Encounter for examination of blood pressure without abnormal findings: Secondary | ICD-10-CM

## 2024-10-20 LAB — POCT URINALYSIS DIPSTICK
Bilirubin, UA: NEGATIVE
Blood, UA: NEGATIVE
Glucose, UA: NEGATIVE
Ketones, UA: NEGATIVE
Leukocytes, UA: NEGATIVE
Nitrite, UA: NEGATIVE
Protein, UA: NEGATIVE
Spec Grav, UA: 1.02
Urobilinogen, UA: 0.2 U/dL
pH, UA: 6

## 2024-10-20 MED ORDER — ESTRADIOL 0.01 % VA CREA: TOPICAL_CREAM | VAGINAL | 12 refills | Status: AC

## 2024-10-20 NOTE — Assessment & Plan Note (Signed)
-   Check UA and Nuswab today. Will send in medication as needed based off results. - Start vaginal estrogen cream as prescribed

## 2024-10-20 NOTE — Assessment & Plan Note (Signed)
-   Continue medications as prescribed. - Reinforced healthy diet and exercise as tolerated. - Check blood work today.

## 2024-10-20 NOTE — Progress Notes (Signed)
 "  Established Patient Office Visit  Subjective:  Patient ID: Lori [name], female    DOB: 27-May-1966  Age: 58 y.o. MRN: [id number]  Chief Complaint  Patient presents with   Follow-up    Follow up    Patient is here today for her 6 months follow up.  She has been feeling fairly well since last appointment.   She does have additional concerns to discuss today. Left sided flank pain that traveled to to her groin lower abdomen region. She reports she was having dark colored urine and increased frequency. Patient reports she has bad habit of holding her bladder throughout the day because she has stress about using public restrooms.  Labs are due today will collect routine blood work. She needs refills.   I have reviewed her active problem list, medication list, allergies, family history, social history, health maintenance, notes from last encounter, lab results for her appointment today.    [facility] did a pap smear in the last 1-2 years. She reports lower abdominal pain has been on and off for the few months. She would like vaginal US . Will defer that to her OBGYN. She reports she has OBGYN appointment 10/2024. She is reports vaginal dryness and fullness sensation. Will start estrogen vaginal cream to help with menopause symptoms.    No other concerns at this time.   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/08/2023   Chest pain    normal LV function by echo 10/2010   High cholesterol    History of syncope 07/07/2012   Idioventricular rhythm (HCC)    Kidney stone    Palpitations    Strain of neck muscle 09/08/2023    Past Surgical History:  Procedure Laterality Date   APPENDECTOMY     CESAREAN SECTION     x2   CHOLECYSTECTOMY     neurocardiogenic syncope     PATENT FORAMEN OVALE CLOSURE     SURGERY ON FACE     AFTER FALL   WRIST SURGERY     broken wrist,left    Social History    Socioeconomic History   Marital status: Married    Spouse name: Not on file   Number of children: Not on file   Years of education: Not on file   Highest education level: Not on file  Occupational History   Not on file  Tobacco Use   Smoking status: Former    Current packs/day: 0.00    Average packs/day: 1 pack/day for 5.0 years (5.0 ttl pk-yrs)    Types: Cigarettes    Start date: 02/11/2001    Quit date: 02/11/2006    Years since quitting: 18.7   Smokeless tobacco: Never  Vaping Use   Vaping status: Never Used  Substance and Sexual Activity   Alcohol use: No    Alcohol/week: 0.0 standard drinks of alcohol   Drug use: No   Sexual activity: Not Currently    Partners: Male    Birth control/protection: Post-menopausal  Other Topics Concern   Not on file  Social History Narrative   Not on file   Social Drivers of Health   Tobacco Use: Medium Risk (10/20/2024)   Patient History    Smoking Tobacco Use: Former    Smokeless Tobacco Use: Never    Passive Exposure: Not on Actuary Strain: Not on file  Food Insecurity: Not on file  Transportation  Needs: Not on file  Physical Activity: Not on file  Stress: Not on file  Social Connections: Not on file  Intimate Partner Violence: Not on file  Depression (EYV7-0): Not on file  Alcohol Screen: Not on file  Housing: Not on file  Utilities: Not on file  Health Literacy: Not on file    Family History  Problem Relation Age of Onset   Hypertension Mother    Heart disease Father 43       MI   Hyperlipidemia Father    Cancer Maternal Aunt    Uterine cancer Maternal Aunt    Breast cancer Neg Hx    Ovarian cancer Neg Hx    Colon cancer Neg Hx     Allergies[1]  Review of Systems  Constitutional:  Negative for malaise/fatigue.  HENT: Negative.    Eyes:  Negative for blurred vision and pain.  Respiratory:  Negative for cough and shortness of breath.   Cardiovascular:  Negative for chest pain,  palpitations, claudication and leg swelling.  Gastrointestinal:  Positive for abdominal pain. Negative for blood in stool, constipation, diarrhea, nausea and vomiting.  Genitourinary:  Positive for dysuria, flank pain, frequency and hematuria. Negative for urgency.  Skin: Negative.   Neurological:  Negative for dizziness, tingling, sensory change and headaches.  Endo/Heme/Allergies: Negative.   Psychiatric/Behavioral: Negative.         Objective:   BP 124/78   Pulse 84   Ht 5' 6 (1.676 m)   Wt 161 lb (73 kg)   LMP 11/19/2015   SpO2 99%   BMI 25.99 kg/m   Vitals:   10/20/24 1054  BP: 124/78  Pulse: 84  Height: 5' 6 (1.676 m)  Weight: 161 lb (73 kg)  SpO2: 99%  BMI (Calculated): 26    Physical Exam Vitals and nursing note reviewed.  Constitutional:      Appearance: Normal appearance.  HENT:     Head: Normocephalic.  Eyes:     Extraocular Movements: Extraocular movements intact.     Pupils: Pupils are equal, round, and reactive to light.  Cardiovascular:     Rate and Rhythm: Normal rate and regular rhythm.     Pulses: Normal pulses.     Heart sounds: Normal heart sounds. No murmur heard. Pulmonary:     Effort: Pulmonary effort is normal. No respiratory distress.     Breath sounds: Normal breath sounds.  Abdominal:     General: There is no distension.     Tenderness: There is no abdominal tenderness.  Musculoskeletal:        General: No tenderness. Normal range of motion.     Cervical back: Normal range of motion and neck supple.     Right lower leg: No edema.     Left lower leg: No edema.  Skin:    General: Skin is warm and dry.     Coloration: Skin is not jaundiced.     Findings: No erythema.  Neurological:     General: No focal deficit present.     Mental Status: She is alert and oriented to person, place, and time.  Psychiatric:        Mood and Affect: Mood normal.        Speech: Speech normal.        Behavior: Behavior is cooperative.         Cognition and Memory: Memory is not impaired.      Results for orders placed or performed in visit on 10/20/24  POCT Urinalysis Dipstick (81002)  Result Value Ref Range   Color, UA     Clarity, UA     Glucose, UA Negative Negative   Bilirubin, UA Negative    Ketones, UA Negative    Spec Grav, UA 1.020 1.010 - 1.025   Blood, UA Negative    pH, UA 6.0 5.0 - 8.0   Protein, UA Negative Negative   Urobilinogen, UA 0.2 0.2 or 1.0 E.U./dL   Nitrite, UA Negative    Leukocytes, UA Negative Negative   Appearance     Odor      Recent Results (from the past 2160 hours)  POC Covid19/Flu A&B Antigen     Status: None   Collection Time: 07/25/24  1:02 PM  Result Value Ref Range   Influenza A Antigen, POC Negative Negative   Influenza B Antigen, POC Negative Negative   Covid Antigen, POC Negative Negative  Basic metabolic panel with GFR     Status: Abnormal   Collection Time: 07/28/24 12:46 PM  Result Value Ref Range   Sodium 139 135 - 145 mmol/L   Potassium 4.2 3.5 - 5.1 mmol/L   Chloride 107 98 - 111 mmol/L   CO2 25 22 - 32 mmol/L   Glucose, Bld 89 70 - 99 mg/dL    Comment: Glucose reference range applies only to samples taken after fasting for at least 8 hours.   BUN 19 6 - 20 mg/dL   Creatinine, Ser 9.29 0.44 - 1.00 mg/dL   Calcium  8.6 (L) 8.9 - 10.3 mg/dL   GFR, Estimated >39 >39 mL/min    Comment: (NOTE) Calculated using the CKD-EPI Creatinine Equation (2021)    Anion gap 7 5 - 15    Comment: Performed at [facility], [address], [place], [place] [address]  Troponin I (High Sensitivity)     Status: None   Collection Time: 07/28/24 12:46 PM  Result Value Ref Range   Troponin I (High Sensitivity) 2 <18 ng/L    Comment: (NOTE) Elevated high sensitivity troponin I (hsTnI) values and significant  changes across serial measurements may suggest ACS but many other  chronic and acute conditions are known to elevate hsTnI results.  Refer to the Links section for  chest pain algorithms and additional  guidance. Performed at [facility], [address] [place], [place] [address]   CBC with Differential/Platelet     Status: None   Collection Time: 07/28/24 12:46 PM  Result Value Ref Range   WBC 7.5 4.0 - 10.5 K/uL   RBC 4.81 3.87 - 5.11 MIL/uL   Hemoglobin 13.8 12.0 - 15.0 g/dL   HCT 58.1 63.9 - 53.9 %   MCV 86.9 80.0 - 100.0 fL   MCH 28.7 26.0 - 34.0 pg   MCHC 33.0 30.0 - 36.0 g/dL   RDW 87.1 88.4 - 84.4 %   Platelets 213 150 - 400 K/uL   nRBC 0.0 0.0 - 0.2 %   Neutrophils Relative % 57 %   Neutro Abs 4.2 1.7 - 7.7 K/uL   Lymphocytes Relative 36 %   Lymphs Abs 2.7 0.7 - 4.0 K/uL   Monocytes Relative 6 %   Monocytes Absolute 0.4 0.1 - 1.0 K/uL   Eosinophils Relative 1 %   Eosinophils Absolute 0.1 0.0 - 0.5 K/uL   Basophils Relative 0 %   Basophils Absolute 0.0 0.0 - 0.1 K/uL   Immature Granulocytes 0 %   Abs Immature Granulocytes 0.03 0.00 - 0.07 K/uL  Comment: Performed at [facility], [address] [place], [place] [address]  ECHOCARDIOGRAM COMPLETE     Status: None   Collection Time: 09/10/24  3:31 PM  Result Value Ref Range   AR max vel 1.57 cm2   AV Peak grad 7.6 mmHg   Ao pk vel 1.38 m/s   S' Lateral 2.80 cm   Area-P 1/2 3.99 cm2   AV Area VTI 1.69 cm2   AV Mean grad 4.0 mmHg   AV Area mean vel 1.65 cm2   Est EF 55 - 60%   POCT Urinalysis Dipstick (18997)     Status: None   Collection Time: 10/12/24 10:03 AM  Result Value Ref Range   Color, UA Yellow    Clarity, UA Turbid    Glucose, UA Negative Negative   Bilirubin, UA Negative    Ketones, UA Trace    Spec Grav, UA 1.025 1.010 - 1.025   Blood, UA Trace    pH, UA 5.5 5.0 - 8.0   Protein, UA Negative Negative   Urobilinogen, UA 0.2 0.2 or 1.0 E.U./dL   Nitrite, UA Negative    Leukocytes, UA Negative Negative   Appearance Turbid    Odor Yes   POCT Urinalysis Dipstick (18997)     Status: Normal   Collection Time: 10/20/24 11:52 AM   Result Value Ref Range   Color, UA     Clarity, UA     Glucose, UA Negative Negative   Bilirubin, UA Negative    Ketones, UA Negative    Spec Grav, UA 1.020 1.010 - 1.025   Blood, UA Negative    pH, UA 6.0 5.0 - 8.0   Protein, UA Negative Negative   Urobilinogen, UA 0.2 0.2 or 1.0 E.U./dL   Nitrite, UA Negative    Leukocytes, UA Negative Negative   Appearance     Odor         Assessment & Plan:   Assessment & Plan Acute vaginitis Dysuria Vaginal dryness, menopausal - Check UA and Nuswab today. Will send in medication as needed based off results. - Start vaginal estrogen cream as prescribed Mixed hyperlipidemia Prediabetes Overweight (BMI 25.0-29.9) - Continue medications as prescribed. - Reinforced healthy diet and exercise as tolerated. - Check blood work today. Chronic fatigue Dysautonomia (HCC) - Check blood work today. - Supplementation recommended based off lab results and will notify patient at that time  Breast cancer screening by mammogram Screening for colon cancer - mammogram and cologuard ordered to be completed for preventative health maintenance.    Return in about 3 months (around 01/18/2025).   Total time spent: 25 minutes  Lori DELENA Cain, FNP  10/20/2024   This document may have been prepared by [facility] Voice Recognition software and as such may include unintentional dictation errors.     [1]  Allergies Allergen Reactions   Atorvastatin  Swelling   Ciprofloxacin Hcl Other (See Comments)    Heart condition  Lowers blood pressure   Cyclobenzaprine Other (See Comments)    Angina; lowers BP   Latex    Levaquin [Levofloxacin In D5w]     Tremors, nausea   Nortriptyline Other (See Comments)    Angina, lowers BP   Pravastatin  Other (See Comments)    gingivitis   Prednisone Other (See Comments)    Has heart condition causes BP to gt too low.   Sulfamethizole Hives   Eggshell Membrane (Chicken) [Egg Shells]    Repatha  [Evolocumab ] Other  (See Comments)  myalgias   Naproxen Rash   "

## 2024-10-20 NOTE — Assessment & Plan Note (Signed)
-   mammogram and cologuard ordered to be completed for preventative health maintenance.

## 2024-10-20 NOTE — Assessment & Plan Note (Signed)
-   Check blood work today. - Supplementation recommended based off lab results and will notify patient at that time

## 2024-10-22 LAB — URINE CULTURE: Organism ID, Bacteria: NO GROWTH

## 2024-10-22 NOTE — Progress Notes (Signed)
Pt informed

## 2024-10-23 LAB — NUSWAB VAGINITIS PLUS (VG+)
Candida albicans, NAA: NEGATIVE
Candida glabrata, NAA: NEGATIVE
Chlamydia trachomatis, NAA: NEGATIVE
Neisseria gonorrhoeae, NAA: NEGATIVE
Trich vag by NAA: NEGATIVE

## 2024-10-25 ENCOUNTER — Ambulatory Visit: Admitting: Family

## 2024-10-25 DIAGNOSIS — J069 Acute upper respiratory infection, unspecified: Secondary | ICD-10-CM

## 2024-10-25 DIAGNOSIS — J45998 Other asthma: Secondary | ICD-10-CM

## 2024-10-25 LAB — POCT XPERT XPRESS SARS COVID-2/FLU/RSV
FLU A: NEGATIVE
FLU B: NEGATIVE
RSV RNA, PCR: POSITIVE
SARS Coronavirus 2: NEGATIVE

## 2024-10-25 MED ORDER — ALBUTEROL SULFATE HFA 108 (90 BASE) MCG/ACT IN AERS: 1.0000 | INHALATION_SPRAY | RESPIRATORY_TRACT | 2 refills | Status: AC | PRN

## 2024-10-25 NOTE — Progress Notes (Signed)
"  Patient notified   "

## 2024-10-25 NOTE — Progress Notes (Signed)
" ° °  CHIEF COMPLAINT  UA/ only visit fot UTI     REASON FOR VISIT  Possible UTI, UA Visit Only      ASSESSMENT & PLAN Diagnoses and all orders for this visit:  Dysuria -     POCT Urinalysis Dipstick (18997)     Patient notified.  Total time spent: 5 minutes  ALAN CHRISTELLA ARRANT, FNP 10/12/2024 "

## 2024-10-26 ENCOUNTER — Ambulatory Visit: Payer: Self-pay

## 2024-10-27 NOTE — Progress Notes (Signed)
 CHIEF COMPLAINT  PCR Testing      REASON FOR VISIT  PCR Test Only     ASSESSMENT  Contact with and (suspected) exposure to other viral communicable disease     PLAN  COVID/FluA+B/RSV completed in the office today  COVID Negative Flu A  Negative Flu B Negative RSV Positive

## 2024-11-05 ENCOUNTER — Ambulatory Visit: Payer: Self-pay | Admitting: Cardiology

## 2024-11-05 ENCOUNTER — Encounter: Payer: Self-pay | Admitting: Cardiology

## 2024-11-05 ENCOUNTER — Ambulatory Visit: Admitting: Cardiology

## 2024-11-05 ENCOUNTER — Ambulatory Visit
Admission: RE | Admit: 2024-11-05 | Discharge: 2024-11-05 | Disposition: A | Source: Ambulatory Visit | Attending: Cardiology

## 2024-11-05 ENCOUNTER — Ambulatory Visit
Admission: RE | Admit: 2024-11-05 | Discharge: 2024-11-05 | Disposition: A | Attending: Cardiology | Admitting: Cardiology

## 2024-11-05 VITALS — BP 118/62 | HR 83 | Ht 66.0 in | Wt 165.0 lb

## 2024-11-05 DIAGNOSIS — B349 Viral infection, unspecified: Secondary | ICD-10-CM

## 2024-11-05 DIAGNOSIS — R0602 Shortness of breath: Secondary | ICD-10-CM

## 2024-11-05 DIAGNOSIS — Z0130 Encounter for examination of blood pressure without abnormal findings: Secondary | ICD-10-CM

## 2024-11-05 MED ORDER — PREDNISONE 20 MG PO TABS: 20.0000 mg | ORAL_TABLET | Freq: Every day | ORAL | 0 refills | Status: AC

## 2024-11-05 MED ORDER — BENZONATATE 200 MG PO CAPS: 200.0000 mg | ORAL_CAPSULE | Freq: Three times a day (TID) | ORAL | 0 refills | Status: DC | PRN

## 2024-11-05 NOTE — Progress Notes (Signed)
 "  Established Patient Office Visit  Subjective:  Patient ID: Lori [name], female    DOB: 1966-04-29  Age: 59 y.o. MRN: [id number]  Chief Complaint  Patient presents with   Acute Visit    RSV 2 weeks ago and still very SOB     Patient in office for an acute visit, complaining of shortness of breath. Daughter on phone providing interpretation. Patient tested positive for RSV on 10/25/24. Patient comes in today complaining of ear pain, nasal congestion, PND, runny nose, cough and shortness of breath. Has been using albuterol  inhaler, Mucinex with no relief. Will get a chest xray today. Patient has a prednisone  allergy to high doses of prednisone  per daughter, she can tolerate low doses. Will send in prednisone  20 mg for three days. Will send in tessalon  pearls.  Further recommendations pending results of chest xray.   Cough This is a new problem. The current episode started 1 to 4 weeks ago. The problem has been waxing and waning. Associated symptoms include ear pain, nasal congestion, postnasal drip, rhinorrhea and shortness of breath. Pertinent negatives include no chest pain, headaches or myalgias. She has tried steroid inhaler, rest and body position changes for the symptoms. RSV recently    No other concerns at this time.   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/08/2023   Chest pain    normal LV function by echo 10/2010   High cholesterol    History of syncope 07/07/2012   Idioventricular rhythm (HCC)    Kidney stone    Palpitations    Strain of neck muscle 09/08/2023    Past Surgical History:  Procedure Laterality Date   APPENDECTOMY     CESAREAN SECTION     x2   CHOLECYSTECTOMY     neurocardiogenic syncope     PATENT FORAMEN OVALE CLOSURE     SURGERY ON FACE     AFTER FALL   WRIST SURGERY     broken wrist,left    Social History   Socioeconomic History   Marital status: Married    Spouse  name: Not on file   Number of children: Not on file   Years of education: Not on file   Highest education level: Not on file  Occupational History   Not on file  Tobacco Use   Smoking status: Former    Current packs/day: 0.00    Average packs/day: 1 pack/day for 5.0 years (5.0 ttl pk-yrs)    Types: Cigarettes    Start date: 02/11/2001    Quit date: 02/11/2006    Years since quitting: 18.7   Smokeless tobacco: Never  Vaping Use   Vaping status: Never Used  Substance and Sexual Activity   Alcohol use: No    Alcohol/week: 0.0 standard drinks of alcohol   Drug use: No   Sexual activity: Not Currently    Partners: Male    Birth control/protection: Post-menopausal  Other Topics Concern   Not on file  Social History Narrative   Not on file   Social Drivers of Health   Tobacco Use: Medium Risk (11/05/2024)   Patient History    Smoking Tobacco Use: Former    Smokeless Tobacco Use: Never    Passive Exposure: Not on Actuary Strain: Not on file  Food Insecurity: Not on file  Transportation Needs: Not on file  Physical Activity: Not on file  Stress: Not on  file  Social Connections: Not on file  Intimate Partner Violence: Not on file  Depression [id number]): Not on file  Alcohol Screen: Not on file  Housing: Not on file  Utilities: Not on file  Health Literacy: Not on file    Family History  Problem Relation Age of Onset   Hypertension Mother    Heart disease Father 27       MI   Hyperlipidemia Father    Cancer Maternal Aunt    Uterine cancer Maternal Aunt    Breast cancer Neg Hx    Ovarian cancer Neg Hx    Colon cancer Neg Hx     Allergies[1]  Show/hide medication list[2]  Review of Systems  Constitutional: Negative.   HENT:  Positive for ear pain, postnasal drip and rhinorrhea.   Eyes: Negative.   Respiratory:  Positive for cough and shortness of breath.   Cardiovascular: Negative.  Negative for chest pain.  Gastrointestinal: Negative.  Negative  for abdominal pain, constipation and diarrhea.  Genitourinary: Negative.   Musculoskeletal:  Negative for joint pain and myalgias.  Skin: Negative.   Neurological: Negative.  Negative for dizziness and headaches.  Endo/Heme/Allergies: Negative.   All other systems reviewed and are negative.      Objective:   BP 118/62   Pulse 83   Ht 5' 6 (1.676 m)   Wt 165 lb (74.8 kg)   LMP 11/19/2015   SpO2 97%   BMI 26.63 kg/m   Vitals:   11/05/24 1130  BP: 118/62  Pulse: 83  Height: 5' 6 (1.676 m)  Weight: 165 lb (74.8 kg)  SpO2: 97%  BMI (Calculated): 26.64    Physical Exam Vitals and nursing note reviewed.  Constitutional:      Appearance: Normal appearance. She is normal weight.  HENT:     Head: Normocephalic and atraumatic.     Nose: Nose normal.     Mouth/Throat:     Mouth: Mucous membranes are moist.  Eyes:     Extraocular Movements: Extraocular movements intact.     Conjunctiva/sclera: Conjunctivae normal.     Pupils: Pupils are equal, round, and reactive to light.  Cardiovascular:     Rate and Rhythm: Normal rate and regular rhythm.     Pulses: Normal pulses.     Heart sounds: Normal heart sounds.  Pulmonary:     Effort: Pulmonary effort is normal.     Breath sounds: Normal breath sounds.  Abdominal:     General: Abdomen is flat. Bowel sounds are normal.     Palpations: Abdomen is soft.  Musculoskeletal:        General: Normal range of motion.     Cervical back: Normal range of motion.  Skin:    General: Skin is warm and dry.  Neurological:     General: No focal deficit present.     Mental Status: She is alert and oriented to person, place, and time.  Psychiatric:        Mood and Affect: Mood normal.        Behavior: Behavior normal.        Thought Content: Thought content normal.        Judgment: Judgment normal.      No results found for any visits on 11/05/24.  Recent Results (from the past 2160 hours)  ECHOCARDIOGRAM COMPLETE     Status:  None   Collection Time: 09/10/24  3:31 PM  Result Value Ref Range   AR max vel 1.57  cm2   AV Peak grad 7.6 mmHg   Ao pk vel 1.38 m/s   S' Lateral 2.80 cm   Area-P 1/2 3.99 cm2   AV Area VTI 1.69 cm2   AV Mean grad 4.0 mmHg   AV Area mean vel 1.65 cm2   Est EF 55 - 60%   POCT Urinalysis Dipstick (18997)     Status: None   Collection Time: 10/12/24 10:03 AM  Result Value Ref Range   Color, UA Yellow    Clarity, UA Turbid    Glucose, UA Negative Negative   Bilirubin, UA Negative    Ketones, UA Trace    Spec Grav, UA 1.025 1.010 - 1.025   Blood, UA Trace    pH, UA 5.5 5.0 - 8.0   Protein, UA Negative Negative   Urobilinogen, UA 0.2 0.2 or 1.0 E.U./dL   Nitrite, UA Negative    Leukocytes, UA Negative Negative   Appearance Turbid    Odor Yes   NuSwab Vaginitis Plus (VG+)     Status: None   Collection Time: 10/20/24 11:48 AM  Result Value Ref Range   Atopobium vaginae Low - 0 Score   BVAB 2 Low - 0 Score   Megasphaera 1 Low - 0 Score    Comment: Calculate total score by adding the 3 individual bacterial vaginosis (BV) marker scores together.  Total score is interpreted as follows: Total score 0-1: Indicates the absence of BV. Total score   2: Indeterminate for BV. Additional clinical                  data should be evaluated to establish a                  diagnosis. Total score 3-6: Indicates the presence of BV.    Candida albicans, NAA Negative Negative   Candida glabrata, NAA Negative Negative   Trich vag by NAA Negative Negative   Chlamydia trachomatis, NAA Negative Negative   Neisseria gonorrhoeae, NAA Negative Negative  POCT Urinalysis Dipstick (18997)     Status: Normal   Collection Time: 10/20/24 11:52 AM  Result Value Ref Range   Color, UA     Clarity, UA     Glucose, UA Negative Negative   Bilirubin, UA Negative    Ketones, UA Negative    Spec Grav, UA 1.020 1.010 - 1.025   Blood, UA Negative    pH, UA 6.0 5.0 - 8.0   Protein, UA Negative Negative    Urobilinogen, UA 0.2 0.2 or 1.0 E.U./dL   Nitrite, UA Negative    Leukocytes, UA Negative Negative   Appearance     Odor    Urine Culture     Status: None   Collection Time: 10/20/24 11:54 AM   Specimen: Urine   UR  Result Value Ref Range   Urine Culture, Routine Final report    Organism ID, Bacteria No growth   POCT XPERT XPRESS SARS COVID-2/FLU/RSV     Status: None   Collection Time: 10/25/24 11:17 AM  Result Value Ref Range   SARS Coronavirus 2 Negative    FLU A Negative    FLU B Negative    RSV RNA, PCR Positive       Assessment & Plan:  Chest xray Tessalon  pearls Prednisone   Problem List Items Addressed This Visit       Other   Shortness of breath   Relevant Orders   DG Chest 2 View (Completed)  Viral illness - Primary   Relevant Orders   DG Chest 2 View (Completed)    Return in about 4 weeks (around 12/03/2024) for with Alan.   Total time spent: 25 minutes. This time includes review of previous notes and results and patient face to face interaction during today's visit.    Jeoffrey Pollen, NP  11/05/2024   This document may have been prepared by Dragon Voice Recognition software and as such may include unintentional dictation errors.      [1]  Allergies Allergen Reactions   Atorvastatin  Swelling   Ciprofloxacin Hcl Other (See Comments)    Heart condition  Lowers blood pressure   Cyclobenzaprine Other (See Comments)    Angina; lowers BP   Latex    Levaquin [Levofloxacin In D5w]     Tremors, nausea   Nortriptyline Other (See Comments)    Angina, lowers BP   Pravastatin  Other (See Comments)    gingivitis   Prednisone  Other (See Comments)    Has heart condition causes BP to gt too low.   Sulfamethizole Hives   Eggshell Membrane (Chicken) [Egg Shells]    Repatha  [Evolocumab ] Other (See Comments)    myalgias   Naproxen Rash  [2]  Outpatient Medications Prior to Visit  Medication Sig   albuterol  (VENTOLIN  HFA) 108 (90 Base) MCG/ACT inhaler  Inhale 1-2 puffs into the lungs every 4 (four) hours as needed for wheezing or shortness of breath.   Alirocumab  (PRALUENT ) 75 MG/ML SOAJ Inject 1 mL (75 mg total) into the skin every 14 (fourteen) days.   estradiol  (ESTRACE ) 0.01 % CREA vaginal cream Apply nightly for 7 days then reduce to 3 times a week: Monday, Wednesday, Friday.   fluticasone  (FLONASE ) 50 MCG/ACT nasal spray Place 1 spray into both nostrils daily.   No facility-administered medications prior to visit.   "

## 2024-11-10 ENCOUNTER — Encounter: Admitting: Obstetrics

## 2024-11-25 ENCOUNTER — Ambulatory Visit: Admitting: Cardiology

## 2024-12-02 ENCOUNTER — Ambulatory Visit: Admitting: Cardiology

## 2024-12-02 ENCOUNTER — Encounter: Payer: Self-pay | Admitting: Cardiology

## 2024-12-02 VITALS — BP 122/84 | HR 73 | Ht 66.0 in | Wt 165.2 lb

## 2024-12-02 DIAGNOSIS — R051 Acute cough: Secondary | ICD-10-CM | POA: Diagnosis not present

## 2024-12-02 DIAGNOSIS — J4520 Mild intermittent asthma, uncomplicated: Secondary | ICD-10-CM

## 2024-12-02 DIAGNOSIS — Z0130 Encounter for examination of blood pressure without abnormal findings: Secondary | ICD-10-CM

## 2024-12-02 DIAGNOSIS — J3089 Other allergic rhinitis: Secondary | ICD-10-CM | POA: Diagnosis not present

## 2024-12-02 MED ORDER — PREDNISONE 20 MG PO TABS: 20.0000 mg | ORAL_TABLET | Freq: Every day | ORAL | 0 refills | Status: AC

## 2024-12-02 MED ORDER — BENZONATATE 200 MG PO CAPS: 200.0000 mg | ORAL_CAPSULE | Freq: Three times a day (TID) | ORAL | 0 refills | Status: AC | PRN

## 2024-12-02 NOTE — Progress Notes (Signed)
 "  Established Patient Office Visit  Subjective:  Patient ID: Lori [name], female    DOB: February 20, 1966  Age: 59 y.o. MRN: [id number]  Chief Complaint  Patient presents with   Acute Visit    Coughing never went away from when pt. Had RSV in December. Pt. Feels something is in her chest but she can't get it out, experiencing congestion, diarrhea.    Patient in office for an acute visit, complaining of a cough that won't go away. Daughter serves as engineer, technical sales. Patient tested positive for RSV on 10/25/24. Patient reports she was feeling better, then started to feel poorly again. Patient complaining of cough and congestion. Denies sinus pain. Symptoms started again about 3 days ago. Has been using Flonase , Tylenol  cold and flu with no relief. Will send in prednisone , patient has taken in small doses with no adverse effects. Will send in Tessalon  pearls. Drink plenty of water. Continue Tylenol , Mucinex and nasal spray.     No other concerns at this time.   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/08/2023   Chest pain    normal LV function by echo 10/2010   High cholesterol    History of syncope 07/07/2012   Idioventricular rhythm (HCC)    Kidney stone    Palpitations    Strain of neck muscle 09/08/2023    Past Surgical History:  Procedure Laterality Date   APPENDECTOMY     CESAREAN SECTION     x2   CHOLECYSTECTOMY     neurocardiogenic syncope     PATENT FORAMEN OVALE CLOSURE     SURGERY ON FACE     AFTER FALL   WRIST SURGERY     broken wrist,left    Social History   Socioeconomic History   Marital status: Married    Spouse name: Not on file   Number of children: Not on file   Years of education: Not on file   Highest education level: Not on file  Occupational History   Not on file  Tobacco Use   Smoking status: Former    Current packs/day: 0.00    Average packs/day: 1 pack/day for 5.0 years  (5.0 ttl pk-yrs)    Types: Cigarettes    Start date: 02/11/2001    Quit date: 02/11/2006    Years since quitting: 18.8   Smokeless tobacco: Never  Vaping Use   Vaping status: Never Used  Substance and Sexual Activity   Alcohol use: No    Alcohol/week: 0.0 standard drinks of alcohol   Drug use: No   Sexual activity: Not Currently    Partners: Male    Birth control/protection: Post-menopausal  Other Topics Concern   Not on file  Social History Narrative   Not on file   Social Drivers of Health   Tobacco Use: Medium Risk (12/02/2024)   Patient History    Smoking Tobacco Use: Former    Smokeless Tobacco Use: Never    Passive Exposure: Not on Actuary Strain: Not on file  Food Insecurity: Not on file  Transportation Needs: Not on file  Physical Activity: Not on file  Stress: Not on file  Social Connections: Not on file  Intimate Partner Violence: Not on file  Depression (EYV7-0): Not on file  Alcohol Screen: Not on file  Housing: Not on file  Utilities: Not on file  Health Literacy: Not on file  Family History  Problem Relation Age of Onset   Hypertension Mother    Heart disease Father 42       MI   Hyperlipidemia Father    Cancer Maternal Aunt    Uterine cancer Maternal Aunt    Breast cancer Neg Hx    Ovarian cancer Neg Hx    Colon cancer Neg Hx     Allergies[1]  Show/hide medication list[2]  Review of Systems  Constitutional:  Positive for malaise/fatigue.  HENT:  Positive for congestion. Negative for sinus pain and sore throat.   Eyes: Negative.   Respiratory:  Positive for cough. Negative for sputum production and shortness of breath.   Cardiovascular: Negative.  Negative for chest pain.  Gastrointestinal: Negative.  Negative for abdominal pain, constipation and diarrhea.  Genitourinary: Negative.   Musculoskeletal:  Negative for joint pain and myalgias.  Skin: Negative.   Neurological: Negative.  Negative for dizziness and headaches.   Endo/Heme/Allergies: Negative.   All other systems reviewed and are negative.      Objective:   BP 122/84   Pulse 73   Ht 5' 6 (1.676 m)   Wt 165 lb 3.2 oz (74.9 kg)   LMP 11/19/2015   SpO2 99%   BMI 26.66 kg/m   Vitals:   12/02/24 1129  BP: 122/84  Pulse: 73  Height: 5' 6 (1.676 m)  Weight: 165 lb 3.2 oz (74.9 kg)  SpO2: 99%  BMI (Calculated): 26.68    Physical Exam Vitals and nursing note reviewed.  Constitutional:      Appearance: Normal appearance. She is normal weight.  HENT:     Head: Normocephalic and atraumatic.     Nose: Nose normal.     Mouth/Throat:     Mouth: Mucous membranes are moist.  Eyes:     Extraocular Movements: Extraocular movements intact.     Conjunctiva/sclera: Conjunctivae normal.     Pupils: Pupils are equal, round, and reactive to light.  Cardiovascular:     Rate and Rhythm: Normal rate and regular rhythm.     Pulses: Normal pulses.     Heart sounds: Normal heart sounds.  Pulmonary:     Effort: Pulmonary effort is normal.     Breath sounds: Normal breath sounds. No stridor. No wheezing, rhonchi or rales.  Abdominal:     General: Abdomen is flat. Bowel sounds are normal.     Palpations: Abdomen is soft.  Musculoskeletal:        General: Normal range of motion.     Cervical back: Normal range of motion.  Skin:    General: Skin is warm and dry.  Neurological:     General: No focal deficit present.     Mental Status: She is alert and oriented to person, place, and time.  Psychiatric:        Mood and Affect: Mood normal.        Behavior: Behavior normal.        Thought Content: Thought content normal.        Judgment: Judgment normal.      No results found for any visits on 12/02/24.  Recent Results (from the past 2160 hours)  ECHOCARDIOGRAM COMPLETE     Status: None   Collection Time: 09/10/24  3:31 PM  Result Value Ref Range   AR max vel 1.57 cm2   AV Peak grad 7.6 mmHg   Ao pk vel 1.38 m/s   S' Lateral 2.80 cm    Area-P 1/2 3.99  cm2   AV Area VTI 1.69 cm2   AV Mean grad 4.0 mmHg   AV Area mean vel 1.65 cm2   Est EF 55 - 60%   POCT Urinalysis Dipstick (18997)     Status: None   Collection Time: 10/12/24 10:03 AM  Result Value Ref Range   Color, UA Yellow    Clarity, UA Turbid    Glucose, UA Negative Negative   Bilirubin, UA Negative    Ketones, UA Trace    Spec Grav, UA 1.025 1.010 - 1.025   Blood, UA Trace    pH, UA 5.5 5.0 - 8.0   Protein, UA Negative Negative   Urobilinogen, UA 0.2 0.2 or 1.0 E.U./dL   Nitrite, UA Negative    Leukocytes, UA Negative Negative   Appearance Turbid    Odor Yes   NuSwab Vaginitis Plus (VG+)     Status: None   Collection Time: 10/20/24 11:48 AM  Result Value Ref Range   Atopobium vaginae Low - 0 Score   BVAB 2 Low - 0 Score   Megasphaera 1 Low - 0 Score    Comment: Calculate total score by adding the 3 individual bacterial vaginosis (BV) marker scores together.  Total score is interpreted as follows: Total score 0-1: Indicates the absence of BV. Total score   2: Indeterminate for BV. Additional clinical                  data should be evaluated to establish a                  diagnosis. Total score 3-6: Indicates the presence of BV.    Candida albicans, NAA Negative Negative   Candida glabrata, NAA Negative Negative   Trich vag by NAA Negative Negative   Chlamydia trachomatis, NAA Negative Negative   Neisseria gonorrhoeae, NAA Negative Negative  POCT Urinalysis Dipstick (18997)     Status: Normal   Collection Time: 10/20/24 11:52 AM  Result Value Ref Range   Color, UA     Clarity, UA     Glucose, UA Negative Negative   Bilirubin, UA Negative    Ketones, UA Negative    Spec Grav, UA 1.020 1.010 - 1.025   Blood, UA Negative    pH, UA 6.0 5.0 - 8.0   Protein, UA Negative Negative   Urobilinogen, UA 0.2 0.2 or 1.0 E.U./dL   Nitrite, UA Negative    Leukocytes, UA Negative Negative   Appearance     Odor    Urine Culture     Status: None    Collection Time: 10/20/24 11:54 AM   Specimen: Urine   UR  Result Value Ref Range   Urine Culture, Routine Final report    Organism ID, Bacteria No growth   POCT XPERT XPRESS SARS COVID-2/FLU/RSV     Status: None   Collection Time: 10/25/24 11:17 AM  Result Value Ref Range   SARS Coronavirus 2 Negative    FLU A Negative    FLU B Negative    RSV RNA, PCR Positive       Assessment & Plan:  Prednisone  Tessalon  pearls Drink plenty of water Supportive care  Problem List Items Addressed This Visit       Respiratory   Asthma, mild intermittent, poorly controlled   Relevant Medications   predniSONE  (DELTASONE ) 20 MG tablet   Perennial allergic rhinitis - Primary     Other   Acute cough    Return in  about 4 weeks (around 12/30/2024) for with Alan.   Total time spent: 25 minutes. This time includes review of previous notes and results and patient face to face interaction during today's visit.    Jeoffrey Pollen, NP  12/02/2024   This document may have been prepared by [facility] Voice Recognition software and as such may include unintentional dictation errors.     [1]  Allergies Allergen Reactions   Atorvastatin  Swelling   Ciprofloxacin Hcl Other (See Comments)    Heart condition  Lowers blood pressure   Cyclobenzaprine Other (See Comments)    Angina; lowers BP   Latex    Levaquin [Levofloxacin In D5w]     Tremors, nausea   Nortriptyline Other (See Comments)    Angina, lowers BP   Pravastatin  Other (See Comments)    gingivitis   Prednisone  Other (See Comments)    Has heart condition causes BP to gt too low.   Sulfamethizole Hives   Eggshell Membrane (Chicken) [Egg Shells]    Repatha  [Evolocumab ] Other (See Comments)    myalgias   Naproxen Rash  [2]  Outpatient Medications Prior to Visit  Medication Sig   albuterol  (VENTOLIN  HFA) 108 (90 Base) MCG/ACT inhaler Inhale 1-2 puffs into the lungs every 4 (four) hours as needed for wheezing or shortness of breath.    Alirocumab  (PRALUENT ) 75 MG/ML SOAJ Inject 1 mL (75 mg total) into the skin every 14 (fourteen) days.   fluticasone  (FLONASE ) 50 MCG/ACT nasal spray Place 1 spray into both nostrils daily.   [DISCONTINUED] benzonatate  (TESSALON ) 200 MG capsule Take 1 capsule (200 mg total) by mouth 3 (three) times daily as needed.   estradiol  (ESTRACE ) 0.01 % CREA vaginal cream Apply nightly for 7 days then reduce to 3 times a week: Monday, Wednesday, Friday. (Patient not taking: Reported on 12/02/2024)   No facility-administered medications prior to visit.   "

## 2024-12-31 ENCOUNTER — Ambulatory Visit: Admitting: Family

## 2025-01-11 ENCOUNTER — Ambulatory Visit: Admitting: Cardiology

## 2025-02-22 ENCOUNTER — Ambulatory Visit: Admitting: Cardiology
